# Patient Record
Sex: Male | Born: 1940 | Race: Black or African American | Hispanic: No | Marital: Married | Smoking: Never smoker
Health system: Southern US, Community
[De-identification: ages and names within clinical notes are randomized; demographics above are authoritative.]

## PROBLEM LIST (undated history)

## (undated) DIAGNOSIS — H547 Unspecified visual loss: Secondary | ICD-10-CM

## (undated) DIAGNOSIS — H409 Unspecified glaucoma: Secondary | ICD-10-CM

## (undated) DIAGNOSIS — M1990 Unspecified osteoarthritis, unspecified site: Secondary | ICD-10-CM

## (undated) DIAGNOSIS — E7800 Pure hypercholesterolemia, unspecified: Secondary | ICD-10-CM

## (undated) DIAGNOSIS — H269 Unspecified cataract: Secondary | ICD-10-CM

## (undated) DIAGNOSIS — I10 Essential (primary) hypertension: Secondary | ICD-10-CM

## (undated) HISTORY — DX: Pure hypercholesterolemia, unspecified: E78.00

## (undated) HISTORY — PX: EYE SURGERY: SHX253

## (undated) HISTORY — PX: CATARACT EXTRACTION: SUR2

## (undated) HISTORY — DX: Essential (primary) hypertension: I10

## (undated) HISTORY — DX: Unspecified osteoarthritis, unspecified site: M19.90

## (undated) HISTORY — DX: Unspecified cataract: H26.9

## (undated) HISTORY — PX: JOINT REPLACEMENT: SHX530

## (undated) HISTORY — DX: Unspecified glaucoma: H40.9

## (undated) HISTORY — DX: Unspecified visual loss: H54.7

---

## 2002-05-20 HISTORY — PX: REPLACEMENT TOTAL KNEE: SUR1224

## 2006-07-25 ENCOUNTER — Encounter: Admission: RE | Admit: 2006-07-25 | Discharge: 2006-07-25 | Payer: Self-pay | Admitting: Orthopedic Surgery

## 2006-08-12 ENCOUNTER — Encounter: Admission: RE | Admit: 2006-08-12 | Discharge: 2006-08-12 | Payer: Self-pay | Admitting: Orthopedic Surgery

## 2006-08-26 ENCOUNTER — Encounter: Admission: RE | Admit: 2006-08-26 | Discharge: 2006-08-26 | Payer: Self-pay | Admitting: Orthopedic Surgery

## 2007-08-11 ENCOUNTER — Encounter: Admission: RE | Admit: 2007-08-11 | Discharge: 2007-08-11 | Payer: Self-pay | Admitting: Orthopedic Surgery

## 2007-10-27 ENCOUNTER — Encounter: Admission: RE | Admit: 2007-10-27 | Discharge: 2007-10-27 | Payer: Self-pay | Admitting: Internal Medicine

## 2008-08-22 ENCOUNTER — Encounter: Admission: RE | Admit: 2008-08-22 | Discharge: 2008-08-22 | Payer: Self-pay | Admitting: Orthopedic Surgery

## 2008-09-19 ENCOUNTER — Ambulatory Visit: Payer: Self-pay | Admitting: Surgery

## 2008-11-02 ENCOUNTER — Emergency Department (HOSPITAL_COMMUNITY): Admission: EM | Admit: 2008-11-02 | Discharge: 2008-11-02 | Payer: Self-pay | Admitting: Emergency Medicine

## 2008-11-02 ENCOUNTER — Emergency Department (HOSPITAL_COMMUNITY): Admission: EM | Admit: 2008-11-02 | Discharge: 2008-11-02 | Payer: Self-pay | Admitting: Family Medicine

## 2010-05-01 ENCOUNTER — Encounter
Admission: RE | Admit: 2010-05-01 | Discharge: 2010-05-01 | Payer: Self-pay | Source: Home / Self Care | Attending: Internal Medicine | Admitting: Internal Medicine

## 2010-08-27 LAB — POCT I-STAT, CHEM 8
BUN: 12 mg/dL (ref 6–23)
Calcium, Ion: 1.1 mmol/L — ABNORMAL LOW (ref 1.12–1.32)
Chloride: 104 mEq/L (ref 96–112)
Creatinine, Ser: 1 mg/dL (ref 0.4–1.5)
Glucose, Bld: 108 mg/dL — ABNORMAL HIGH (ref 70–99)
HCT: 44 % (ref 39.0–52.0)
Hemoglobin: 15 g/dL (ref 13.0–17.0)
Potassium: 4.1 mEq/L (ref 3.5–5.1)
Sodium: 137 mEq/L (ref 135–145)
TCO2: 24 mmol/L (ref 0–100)

## 2010-08-27 LAB — POCT URINALYSIS DIP (DEVICE)
Glucose, UA: NEGATIVE mg/dL
Ketones, ur: 15 mg/dL — AB
Nitrite: NEGATIVE
Protein, ur: 30 mg/dL — AB
Specific Gravity, Urine: 1.025 (ref 1.005–1.030)
Urobilinogen, UA: 0.2 mg/dL (ref 0.0–1.0)
pH: 5 (ref 5.0–8.0)

## 2010-10-02 NOTE — H&P (Signed)
NAME:  Chad Miller, Chad Miller NO.:  [id number]   MEDICAL RECORD NO.:  [id number]          PATIENT TYPE:  EMS   LOCATION:  MAJO                         FACILITY:  [facility]   PHYSICIAN:  Lennie Muckle, MD      DATE OF BIRTH:  Dec 22, 1940   DATE OF ADMISSION:  11/02/2008  DATE OF DISCHARGE:  11/02/2008                              HISTORY & PHYSICAL   This is a very pleasant 70 year old male who fell in the shower earlier  this week on Sunday and had pain in his right side and flank since then.  The pain got so severe, then he went to the Urgent Care Clinic today and  he was found to have at least 2 rib fractures and it was felt he should  be referred to the ED.  This was done as the patient was having  significant pain and difficulty with deep breathing.  He underwent  evaluation with CT scanning and this did show multiple right-sided rib  fractures, a very small hemothorax, and small liver laceration.  The  patient's hemoglobin today was 15.  His blood pressure was stable at  126/81 and his heart rate was in the 90s.  His main complaint was of  posterior and lateral rib pain.  He had not been given any pain  medication whatsoever.   PAST MEDICAL HISTORY:  Significant for hypertension,  hypercholesterolemia.  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   PHYSICAL EXAMINATION:  GENERAL:  This is a well-nourished, well-  developed, slender African American male who is complaining of pain over  his right chest wall.  VITAL SIGNS:  Blood pressure was 126/81, heart rate was 92, oxygen  saturation was 97% on room air.  HEENT:  [place]/AT, EOMI, he had a cloudy cornea on the right and no vision  except for light dark in the right eye.  His exam was otherwise within  functional limits.  Trachea was midline.  CHEST:  Clear on the left.  He has slightly diminished breath sounds in  the right base, but he was otherwise clear.   Chest wall tenderness on  the right posterior lateral and lateral aspect.  CVS:  Nontender.  ABDOMINAL:  Positive bowel sounds, soft, nontender.  EXTREMITIES:  Without deformity.   SOCIAL HISTORY:  He is married, retired, and has a supportive wife.  No  alcohol, no drugs, and no tobacco use.   IMPRESSION:  Status post rib fractures x2 on the right, grade 1-2 liver  laceration which is already now 67-26 days old, doing well, and  hemodynamically stable.   PLAN:  We are going to give the patient some oral pain medication and  Flexeril for muscle spasms and see if this controls the patient's pain  enough that he might be able to mobilize and return home with a  prescription.  He will likely be given Percocet 5/325 #80 no refill and  Flexeril 10 mg p.o. t.i.d. p.r.n. muscle spasms.  I suspect that he will  do well and  should be able to be discharged to home later on this  afternoon.  We will discharge him with rib fracture, pain management,  and liver and spleen injury instruction sheets per trauma services.  We  will also plan on having the patient follow up next week in Trauma  Clinic on November 10, 2008.      Shawn Rayburn, P.A.      Lennie Muckle, MD  Electronically Signed    SR/MEDQ  D:  11/02/2008  T:  11/03/2008  Job:  [id number]   cc:   [facility]

## 2010-10-02 NOTE — Assessment & Plan Note (Signed)
OFFICE VISIT   Chad Miller, Chad Miller  DOB:  08-13-1940                                       09/19/2008  ZOXWR#:[id number]   REASON FOR VISIT:  Decreased ABIs.   HISTORY:  This is a 70 year old gentleman that I am seeing at the  request of Dorene Grebe for evaluation of decreased ABIs.  The patient has  a history of undergoing a left knee replacement in 2004 in [place], he  is being followed for left leg pain and numbness.  He underwent  noninvasive vascular lab studies which revealed an ankle brachial index  of 0.57 on the left and 1.13 on the right.  On specific examination, the  patient states that he does not endorse symptoms of claudication or rest  pain.  In fact, he can walk on a treadmill for greater than 30 minutes.  He is limited somewhat by pain and stiffness the following day within  his knee.  He also complains of some back pain which he feels a numbness  and burning that goes down his leg.  This has been going on since his  operation many years ago.  It has progressed somewhat.   The patient does have a history of hypercholesterolemia and  hypertension, both of which he is on medications for and has been doing  very well.  He does not have a significant cardiac history.   REVIEW OF SYSTEMS:  General:  Negative for fevers, chills, weight gain,  weight loss.  Cardiac:  Negative for chest pain, shortness of breath.  Pulmonary:  Negative for bronchitis, asthma, wheezing.  GI:  Negative.  GU:  Negative.  Vascular:  Positive for pain in his feet when lying  flat.  Neuro:  Negative.  Ortho:  Positive for arthritis and joint pain.  Psych:  Negative.  ENT:  Negative.  Heme:  Negative.   PAST MEDICAL HISTORY:  1. Hypertension.  2. Hypercholesterolemia.   PAST SURGICAL HISTORY:  Left total knee replacement in 2004.   FAMILY HISTORY:  Negative for cardiovascular disease at an early age.   SOCIAL HISTORY:  He is married with 2 children.  He is retired.   Does  not smoke, has never smoked.  Does not drink.   MEDICATIONS:  Benicar once daily, Lipitor once daily, and aspirin once  daily.   ALLERGIES TO MEDICATIONS:  None.   PHYSICAL EXAMINATION:  Vital signs:  Blood pressure is 145/83, pulse is  72.  General:  He is well-appearing, in no distress.  HEENT:  Normocephalic, atraumatic.  Pupils equal.  Sclerae anicteric.  Neck:  Supple.  No JVD.  No carotid bruits.  Cardiovascular:  Regular rate and  rhythm.  No murmurs, rubs or gallops.  Pulmonary:  Lungs are clear to  auscultation bilaterally.  Abdomen:  Soft, nontender.  No pulsatile  mass, no hepatosplenomegaly.  Extremities:  Warm and well-perfused.  Pedal pulses are not palpable on the left, there are no ulcerations.  Neuro:  Cranial nerves II-XII are grossly intact, he is nonfocal.  Skin:  Without rash.   DIAGNOSTIC STUDIES:  The patient comes with ultrasound which reveals  ankle brachial index of 0.57 on the left and 1.1 on the right.  He has a  triphasic left popliteal signal, biphasic posterior tibial and  monophasic dorsalis pedis.   ASSESSMENT/PLAN:  Asymptomatic peripheral vascular disease.  Plan:  I discussed with the patient the need for continued medical  management including cholesterol regulation and control of his blood  pressure.  He also will continue to take his aspirin.  At this point,  the patient does not need any intervention regarding his peripheral  vascular disease.  This will need to be followed over time and if he  ever becomes symptomatic we will pursue our options at that time.  I  will not schedule him to come back to see me; however, if he has any  change in his symptoms or evidence of claudication he will contact me.   Jorge Ny, MD  Electronically Signed   VWB/MEDQ  D:  09/19/2008  T:  09/20/2008  Job:  [id number]   cc:   G. Dorene Grebe, M.D.  Candyce Churn. Allyne Gee, M.D.

## 2010-11-06 ENCOUNTER — Other Ambulatory Visit: Payer: Self-pay | Admitting: Internal Medicine

## 2010-11-06 DIAGNOSIS — M5410 Radiculopathy, site unspecified: Secondary | ICD-10-CM

## 2010-11-06 DIAGNOSIS — M545 Low back pain, unspecified: Secondary | ICD-10-CM

## 2010-11-26 ENCOUNTER — Ambulatory Visit
Admission: RE | Admit: 2010-11-26 | Discharge: 2010-11-26 | Disposition: A | Discharge status: Home / Self Care | Payer: Medicare Other | Source: Ambulatory Visit | Attending: Internal Medicine | Admitting: Internal Medicine

## 2010-11-26 DIAGNOSIS — M5410 Radiculopathy, site unspecified: Secondary | ICD-10-CM

## 2010-11-26 DIAGNOSIS — M545 Low back pain, unspecified: Secondary | ICD-10-CM

## 2011-05-27 DIAGNOSIS — R972 Elevated prostate specific antigen [PSA]: Secondary | ICD-10-CM | POA: Diagnosis not present

## 2011-05-29 DIAGNOSIS — K219 Gastro-esophageal reflux disease without esophagitis: Secondary | ICD-10-CM | POA: Diagnosis not present

## 2011-06-06 DIAGNOSIS — N401 Enlarged prostate with lower urinary tract symptoms: Secondary | ICD-10-CM | POA: Diagnosis not present

## 2011-06-12 DIAGNOSIS — Z79899 Other long term (current) drug therapy: Secondary | ICD-10-CM | POA: Diagnosis not present

## 2011-06-12 DIAGNOSIS — I10 Essential (primary) hypertension: Secondary | ICD-10-CM | POA: Diagnosis not present

## 2011-06-12 DIAGNOSIS — Z23 Encounter for immunization: Secondary | ICD-10-CM | POA: Diagnosis not present

## 2011-06-12 DIAGNOSIS — Z0000 Encounter for general adult medical examination without abnormal findings: Secondary | ICD-10-CM | POA: Diagnosis not present

## 2011-06-12 DIAGNOSIS — E7800 Pure hypercholesterolemia, unspecified: Secondary | ICD-10-CM | POA: Diagnosis not present

## 2011-06-12 DIAGNOSIS — M899 Disorder of bone, unspecified: Secondary | ICD-10-CM | POA: Diagnosis not present

## 2011-09-18 DIAGNOSIS — M67919 Unspecified disorder of synovium and tendon, unspecified shoulder: Secondary | ICD-10-CM | POA: Diagnosis not present

## 2011-09-18 DIAGNOSIS — M542 Cervicalgia: Secondary | ICD-10-CM | POA: Diagnosis not present

## 2011-10-03 DIAGNOSIS — M2550 Pain in unspecified joint: Secondary | ICD-10-CM | POA: Diagnosis not present

## 2011-10-03 DIAGNOSIS — I119 Hypertensive heart disease without heart failure: Secondary | ICD-10-CM | POA: Diagnosis not present

## 2011-10-03 DIAGNOSIS — R42 Dizziness and giddiness: Secondary | ICD-10-CM | POA: Diagnosis not present

## 2011-10-03 DIAGNOSIS — J309 Allergic rhinitis, unspecified: Secondary | ICD-10-CM | POA: Diagnosis not present

## 2011-10-03 DIAGNOSIS — Z79899 Other long term (current) drug therapy: Secondary | ICD-10-CM | POA: Diagnosis not present

## 2011-10-03 DIAGNOSIS — I10 Essential (primary) hypertension: Secondary | ICD-10-CM | POA: Diagnosis not present

## 2011-10-03 DIAGNOSIS — E7800 Pure hypercholesterolemia, unspecified: Secondary | ICD-10-CM | POA: Diagnosis not present

## 2011-10-08 DIAGNOSIS — H2010 Chronic iridocyclitis, unspecified eye: Secondary | ICD-10-CM | POA: Diagnosis not present

## 2011-10-08 DIAGNOSIS — H2510 Age-related nuclear cataract, unspecified eye: Secondary | ICD-10-CM | POA: Diagnosis not present

## 2011-10-16 DIAGNOSIS — M67919 Unspecified disorder of synovium and tendon, unspecified shoulder: Secondary | ICD-10-CM | POA: Diagnosis not present

## 2011-10-16 DIAGNOSIS — M719 Bursopathy, unspecified: Secondary | ICD-10-CM | POA: Diagnosis not present

## 2011-10-28 DIAGNOSIS — I10 Essential (primary) hypertension: Secondary | ICD-10-CM | POA: Diagnosis not present

## 2011-10-28 DIAGNOSIS — K219 Gastro-esophageal reflux disease without esophagitis: Secondary | ICD-10-CM | POA: Diagnosis not present

## 2011-10-28 DIAGNOSIS — Z79899 Other long term (current) drug therapy: Secondary | ICD-10-CM | POA: Diagnosis not present

## 2011-10-30 DIAGNOSIS — I10 Essential (primary) hypertension: Secondary | ICD-10-CM | POA: Diagnosis not present

## 2011-10-30 DIAGNOSIS — E7800 Pure hypercholesterolemia, unspecified: Secondary | ICD-10-CM | POA: Diagnosis not present

## 2011-10-30 DIAGNOSIS — R42 Dizziness and giddiness: Secondary | ICD-10-CM | POA: Diagnosis not present

## 2011-10-30 DIAGNOSIS — I4510 Unspecified right bundle-branch block: Secondary | ICD-10-CM | POA: Diagnosis not present

## 2011-11-28 DIAGNOSIS — I10 Essential (primary) hypertension: Secondary | ICD-10-CM | POA: Diagnosis not present

## 2011-11-28 DIAGNOSIS — I4510 Unspecified right bundle-branch block: Secondary | ICD-10-CM | POA: Diagnosis not present

## 2011-11-28 DIAGNOSIS — R42 Dizziness and giddiness: Secondary | ICD-10-CM | POA: Diagnosis not present

## 2011-12-23 DIAGNOSIS — Z79899 Other long term (current) drug therapy: Secondary | ICD-10-CM | POA: Diagnosis not present

## 2011-12-23 DIAGNOSIS — R42 Dizziness and giddiness: Secondary | ICD-10-CM | POA: Diagnosis not present

## 2011-12-23 DIAGNOSIS — E559 Vitamin D deficiency, unspecified: Secondary | ICD-10-CM | POA: Diagnosis not present

## 2011-12-23 DIAGNOSIS — I10 Essential (primary) hypertension: Secondary | ICD-10-CM | POA: Diagnosis not present

## 2011-12-31 DIAGNOSIS — R42 Dizziness and giddiness: Secondary | ICD-10-CM | POA: Diagnosis not present

## 2011-12-31 DIAGNOSIS — H814 Vertigo of central origin: Secondary | ICD-10-CM | POA: Diagnosis not present

## 2011-12-31 DIAGNOSIS — H903 Sensorineural hearing loss, bilateral: Secondary | ICD-10-CM | POA: Diagnosis not present

## 2012-01-14 ENCOUNTER — Other Ambulatory Visit: Payer: Self-pay | Admitting: Gastroenterology

## 2012-01-14 DIAGNOSIS — R634 Abnormal weight loss: Secondary | ICD-10-CM | POA: Diagnosis not present

## 2012-01-14 DIAGNOSIS — R109 Unspecified abdominal pain: Secondary | ICD-10-CM

## 2012-01-14 DIAGNOSIS — R1033 Periumbilical pain: Secondary | ICD-10-CM | POA: Diagnosis not present

## 2012-01-14 DIAGNOSIS — K219 Gastro-esophageal reflux disease without esophagitis: Secondary | ICD-10-CM | POA: Diagnosis not present

## 2012-01-14 DIAGNOSIS — R110 Nausea: Secondary | ICD-10-CM | POA: Diagnosis not present

## 2012-01-17 ENCOUNTER — Ambulatory Visit
Admission: RE | Admit: 2012-01-17 | Discharge: 2012-01-17 | Disposition: A | Discharge status: Home / Self Care | Payer: Medicare Other | Source: Ambulatory Visit | Attending: Gastroenterology | Admitting: Gastroenterology

## 2012-01-17 DIAGNOSIS — K5730 Diverticulosis of large intestine without perforation or abscess without bleeding: Secondary | ICD-10-CM | POA: Diagnosis not present

## 2012-01-17 DIAGNOSIS — K449 Diaphragmatic hernia without obstruction or gangrene: Secondary | ICD-10-CM | POA: Diagnosis not present

## 2012-01-17 DIAGNOSIS — R109 Unspecified abdominal pain: Secondary | ICD-10-CM

## 2012-01-17 DIAGNOSIS — K7689 Other specified diseases of liver: Secondary | ICD-10-CM | POA: Diagnosis not present

## 2012-01-17 MED ORDER — IOHEXOL 300 MG/ML  SOLN
100.0000 mL | Freq: Once | INTRAMUSCULAR | Status: AC | PRN
  Administered 2012-01-17: 100 mL via INTRAVENOUS

## 2012-01-24 DIAGNOSIS — Z23 Encounter for immunization: Secondary | ICD-10-CM | POA: Diagnosis not present

## 2012-04-09 DIAGNOSIS — K219 Gastro-esophageal reflux disease without esophagitis: Secondary | ICD-10-CM | POA: Diagnosis not present

## 2012-04-09 DIAGNOSIS — K5730 Diverticulosis of large intestine without perforation or abscess without bleeding: Secondary | ICD-10-CM | POA: Diagnosis not present

## 2012-05-21 DIAGNOSIS — I10 Essential (primary) hypertension: Secondary | ICD-10-CM | POA: Diagnosis not present

## 2012-05-21 DIAGNOSIS — E7800 Pure hypercholesterolemia, unspecified: Secondary | ICD-10-CM | POA: Diagnosis not present

## 2012-05-21 DIAGNOSIS — R42 Dizziness and giddiness: Secondary | ICD-10-CM | POA: Diagnosis not present

## 2012-05-22 DIAGNOSIS — N401 Enlarged prostate with lower urinary tract symptoms: Secondary | ICD-10-CM | POA: Diagnosis not present

## 2012-05-27 DIAGNOSIS — N401 Enlarged prostate with lower urinary tract symptoms: Secondary | ICD-10-CM | POA: Diagnosis not present

## 2012-07-07 DIAGNOSIS — E7800 Pure hypercholesterolemia, unspecified: Secondary | ICD-10-CM | POA: Diagnosis not present

## 2012-07-07 DIAGNOSIS — Z79899 Other long term (current) drug therapy: Secondary | ICD-10-CM | POA: Diagnosis not present

## 2012-07-07 DIAGNOSIS — I119 Hypertensive heart disease without heart failure: Secondary | ICD-10-CM | POA: Diagnosis not present

## 2012-07-07 DIAGNOSIS — E559 Vitamin D deficiency, unspecified: Secondary | ICD-10-CM | POA: Diagnosis not present

## 2012-07-07 DIAGNOSIS — J3489 Other specified disorders of nose and nasal sinuses: Secondary | ICD-10-CM | POA: Diagnosis not present

## 2012-07-07 DIAGNOSIS — Z0000 Encounter for general adult medical examination without abnormal findings: Secondary | ICD-10-CM | POA: Diagnosis not present

## 2012-09-11 DIAGNOSIS — M25569 Pain in unspecified knee: Secondary | ICD-10-CM | POA: Diagnosis not present

## 2012-09-22 DIAGNOSIS — H179 Unspecified corneal scar and opacity: Secondary | ICD-10-CM | POA: Diagnosis not present

## 2012-09-22 DIAGNOSIS — H25019 Cortical age-related cataract, unspecified eye: Secondary | ICD-10-CM | POA: Diagnosis not present

## 2012-09-22 DIAGNOSIS — H40019 Open angle with borderline findings, low risk, unspecified eye: Secondary | ICD-10-CM | POA: Diagnosis not present

## 2012-10-09 DIAGNOSIS — M25569 Pain in unspecified knee: Secondary | ICD-10-CM | POA: Diagnosis not present

## 2012-10-09 DIAGNOSIS — M67919 Unspecified disorder of synovium and tendon, unspecified shoulder: Secondary | ICD-10-CM | POA: Diagnosis not present

## 2012-10-09 DIAGNOSIS — M719 Bursopathy, unspecified: Secondary | ICD-10-CM | POA: Diagnosis not present

## 2013-01-04 DIAGNOSIS — R634 Abnormal weight loss: Secondary | ICD-10-CM | POA: Diagnosis not present

## 2013-01-04 DIAGNOSIS — I119 Hypertensive heart disease without heart failure: Secondary | ICD-10-CM | POA: Diagnosis not present

## 2013-01-04 DIAGNOSIS — K219 Gastro-esophageal reflux disease without esophagitis: Secondary | ICD-10-CM | POA: Diagnosis not present

## 2013-01-04 DIAGNOSIS — Z79899 Other long term (current) drug therapy: Secondary | ICD-10-CM | POA: Diagnosis not present

## 2013-01-04 DIAGNOSIS — E7800 Pure hypercholesterolemia, unspecified: Secondary | ICD-10-CM | POA: Diagnosis not present

## 2013-01-12 DIAGNOSIS — R634 Abnormal weight loss: Secondary | ICD-10-CM | POA: Diagnosis not present

## 2013-01-12 DIAGNOSIS — R1319 Other dysphagia: Secondary | ICD-10-CM | POA: Diagnosis not present

## 2013-01-12 DIAGNOSIS — K219 Gastro-esophageal reflux disease without esophagitis: Secondary | ICD-10-CM | POA: Diagnosis not present

## 2013-02-08 DIAGNOSIS — H40019 Open angle with borderline findings, low risk, unspecified eye: Secondary | ICD-10-CM | POA: Diagnosis not present

## 2013-02-09 DIAGNOSIS — Z23 Encounter for immunization: Secondary | ICD-10-CM | POA: Diagnosis not present

## 2013-02-24 DIAGNOSIS — K2970 Gastritis, unspecified, without bleeding: Secondary | ICD-10-CM | POA: Diagnosis not present

## 2013-02-24 DIAGNOSIS — R1319 Other dysphagia: Secondary | ICD-10-CM | POA: Diagnosis not present

## 2013-02-24 DIAGNOSIS — K219 Gastro-esophageal reflux disease without esophagitis: Secondary | ICD-10-CM | POA: Diagnosis not present

## 2013-02-24 DIAGNOSIS — R634 Abnormal weight loss: Secondary | ICD-10-CM | POA: Diagnosis not present

## 2013-02-24 DIAGNOSIS — R110 Nausea: Secondary | ICD-10-CM | POA: Diagnosis not present

## 2013-02-24 DIAGNOSIS — K2940 Chronic atrophic gastritis without bleeding: Secondary | ICD-10-CM | POA: Diagnosis not present

## 2013-02-24 DIAGNOSIS — R1310 Dysphagia, unspecified: Secondary | ICD-10-CM | POA: Diagnosis not present

## 2013-02-24 DIAGNOSIS — A048 Other specified bacterial intestinal infections: Secondary | ICD-10-CM | POA: Diagnosis not present

## 2013-03-30 DIAGNOSIS — K449 Diaphragmatic hernia without obstruction or gangrene: Secondary | ICD-10-CM | POA: Diagnosis not present

## 2013-03-30 DIAGNOSIS — K2900 Acute gastritis without bleeding: Secondary | ICD-10-CM | POA: Diagnosis not present

## 2013-03-30 DIAGNOSIS — A048 Other specified bacterial intestinal infections: Secondary | ICD-10-CM | POA: Diagnosis not present

## 2013-04-22 DIAGNOSIS — A048 Other specified bacterial intestinal infections: Secondary | ICD-10-CM | POA: Diagnosis not present

## 2013-04-23 DIAGNOSIS — M25569 Pain in unspecified knee: Secondary | ICD-10-CM | POA: Diagnosis not present

## 2013-05-31 DIAGNOSIS — R972 Elevated prostate specific antigen [PSA]: Secondary | ICD-10-CM | POA: Diagnosis not present

## 2013-05-31 DIAGNOSIS — N400 Enlarged prostate without lower urinary tract symptoms: Secondary | ICD-10-CM | POA: Diagnosis not present

## 2013-08-04 DIAGNOSIS — I4510 Unspecified right bundle-branch block: Secondary | ICD-10-CM | POA: Diagnosis not present

## 2013-08-04 DIAGNOSIS — E7800 Pure hypercholesterolemia, unspecified: Secondary | ICD-10-CM | POA: Diagnosis not present

## 2013-08-04 DIAGNOSIS — Z0000 Encounter for general adult medical examination without abnormal findings: Secondary | ICD-10-CM | POA: Diagnosis not present

## 2013-08-04 DIAGNOSIS — I10 Essential (primary) hypertension: Secondary | ICD-10-CM | POA: Diagnosis not present

## 2013-08-04 DIAGNOSIS — R209 Unspecified disturbances of skin sensation: Secondary | ICD-10-CM | POA: Diagnosis not present

## 2013-08-04 DIAGNOSIS — E559 Vitamin D deficiency, unspecified: Secondary | ICD-10-CM | POA: Diagnosis not present

## 2013-08-04 DIAGNOSIS — R7309 Other abnormal glucose: Secondary | ICD-10-CM | POA: Diagnosis not present

## 2013-08-09 DIAGNOSIS — H4011X0 Primary open-angle glaucoma, stage unspecified: Secondary | ICD-10-CM | POA: Diagnosis not present

## 2013-08-09 DIAGNOSIS — H409 Unspecified glaucoma: Secondary | ICD-10-CM | POA: Diagnosis not present

## 2013-08-17 DIAGNOSIS — I700 Atherosclerosis of aorta: Secondary | ICD-10-CM | POA: Diagnosis not present

## 2013-08-17 DIAGNOSIS — I10 Essential (primary) hypertension: Secondary | ICD-10-CM | POA: Diagnosis not present

## 2013-09-09 DIAGNOSIS — D72819 Decreased white blood cell count, unspecified: Secondary | ICD-10-CM | POA: Diagnosis not present

## 2013-10-14 DIAGNOSIS — K219 Gastro-esophageal reflux disease without esophagitis: Secondary | ICD-10-CM | POA: Diagnosis not present

## 2013-10-14 DIAGNOSIS — R1319 Other dysphagia: Secondary | ICD-10-CM | POA: Diagnosis not present

## 2013-10-14 DIAGNOSIS — K5730 Diverticulosis of large intestine without perforation or abscess without bleeding: Secondary | ICD-10-CM | POA: Diagnosis not present

## 2013-11-09 DIAGNOSIS — N401 Enlarged prostate with lower urinary tract symptoms: Secondary | ICD-10-CM | POA: Diagnosis not present

## 2013-11-09 DIAGNOSIS — N139 Obstructive and reflux uropathy, unspecified: Secondary | ICD-10-CM | POA: Diagnosis not present

## 2013-12-13 DIAGNOSIS — R5381 Other malaise: Secondary | ICD-10-CM | POA: Diagnosis not present

## 2013-12-13 DIAGNOSIS — R5383 Other fatigue: Secondary | ICD-10-CM | POA: Diagnosis not present

## 2013-12-13 DIAGNOSIS — R7309 Other abnormal glucose: Secondary | ICD-10-CM | POA: Diagnosis not present

## 2013-12-13 DIAGNOSIS — N400 Enlarged prostate without lower urinary tract symptoms: Secondary | ICD-10-CM | POA: Diagnosis not present

## 2014-01-28 DIAGNOSIS — Z23 Encounter for immunization: Secondary | ICD-10-CM | POA: Diagnosis not present

## 2014-02-07 DIAGNOSIS — I10 Essential (primary) hypertension: Secondary | ICD-10-CM | POA: Diagnosis not present

## 2014-02-07 DIAGNOSIS — E7800 Pure hypercholesterolemia, unspecified: Secondary | ICD-10-CM | POA: Diagnosis not present

## 2014-02-07 DIAGNOSIS — Z79899 Other long term (current) drug therapy: Secondary | ICD-10-CM | POA: Diagnosis not present

## 2014-03-16 DIAGNOSIS — H25012 Cortical age-related cataract, left eye: Secondary | ICD-10-CM | POA: Diagnosis not present

## 2014-03-16 DIAGNOSIS — H4011X3 Primary open-angle glaucoma, severe stage: Secondary | ICD-10-CM | POA: Diagnosis not present

## 2014-05-23 DIAGNOSIS — N400 Enlarged prostate without lower urinary tract symptoms: Secondary | ICD-10-CM | POA: Diagnosis not present

## 2014-05-26 DIAGNOSIS — M25562 Pain in left knee: Secondary | ICD-10-CM | POA: Diagnosis not present

## 2014-05-26 DIAGNOSIS — Z96652 Presence of left artificial knee joint: Secondary | ICD-10-CM | POA: Diagnosis not present

## 2014-05-30 DIAGNOSIS — R3912 Poor urinary stream: Secondary | ICD-10-CM | POA: Diagnosis not present

## 2014-05-30 DIAGNOSIS — N401 Enlarged prostate with lower urinary tract symptoms: Secondary | ICD-10-CM | POA: Diagnosis not present

## 2014-05-30 DIAGNOSIS — R351 Nocturia: Secondary | ICD-10-CM | POA: Diagnosis not present

## 2014-05-30 DIAGNOSIS — N529 Male erectile dysfunction, unspecified: Secondary | ICD-10-CM | POA: Diagnosis not present

## 2014-09-14 DIAGNOSIS — N182 Chronic kidney disease, stage 2 (mild): Secondary | ICD-10-CM | POA: Diagnosis not present

## 2014-09-14 DIAGNOSIS — R7309 Other abnormal glucose: Secondary | ICD-10-CM | POA: Diagnosis not present

## 2014-09-14 DIAGNOSIS — I129 Hypertensive chronic kidney disease with stage 1 through stage 4 chronic kidney disease, or unspecified chronic kidney disease: Secondary | ICD-10-CM | POA: Diagnosis not present

## 2014-09-14 DIAGNOSIS — Z0000 Encounter for general adult medical examination without abnormal findings: Secondary | ICD-10-CM | POA: Diagnosis not present

## 2014-09-14 DIAGNOSIS — R202 Paresthesia of skin: Secondary | ICD-10-CM | POA: Diagnosis not present

## 2014-09-14 DIAGNOSIS — M79642 Pain in left hand: Secondary | ICD-10-CM | POA: Diagnosis not present

## 2014-09-14 DIAGNOSIS — M85852 Other specified disorders of bone density and structure, left thigh: Secondary | ICD-10-CM | POA: Diagnosis not present

## 2014-09-14 DIAGNOSIS — E559 Vitamin D deficiency, unspecified: Secondary | ICD-10-CM | POA: Diagnosis not present

## 2014-09-14 DIAGNOSIS — R413 Other amnesia: Secondary | ICD-10-CM | POA: Diagnosis not present

## 2014-09-14 DIAGNOSIS — Z1389 Encounter for screening for other disorder: Secondary | ICD-10-CM | POA: Diagnosis not present

## 2014-10-10 DIAGNOSIS — H40012 Open angle with borderline findings, low risk, left eye: Secondary | ICD-10-CM | POA: Diagnosis not present

## 2014-11-15 DIAGNOSIS — M79606 Pain in leg, unspecified: Secondary | ICD-10-CM | POA: Diagnosis not present

## 2014-11-15 DIAGNOSIS — I10 Essential (primary) hypertension: Secondary | ICD-10-CM | POA: Diagnosis not present

## 2014-12-30 DIAGNOSIS — I129 Hypertensive chronic kidney disease with stage 1 through stage 4 chronic kidney disease, or unspecified chronic kidney disease: Secondary | ICD-10-CM | POA: Diagnosis not present

## 2014-12-30 DIAGNOSIS — D72819 Decreased white blood cell count, unspecified: Secondary | ICD-10-CM | POA: Diagnosis not present

## 2014-12-30 DIAGNOSIS — N182 Chronic kidney disease, stage 2 (mild): Secondary | ICD-10-CM | POA: Diagnosis not present

## 2014-12-30 DIAGNOSIS — E780 Pure hypercholesterolemia: Secondary | ICD-10-CM | POA: Diagnosis not present

## 2015-01-16 DIAGNOSIS — Z23 Encounter for immunization: Secondary | ICD-10-CM | POA: Diagnosis not present

## 2015-03-16 DIAGNOSIS — R7309 Other abnormal glucose: Secondary | ICD-10-CM | POA: Diagnosis not present

## 2015-03-16 DIAGNOSIS — E7800 Pure hypercholesterolemia, unspecified: Secondary | ICD-10-CM | POA: Diagnosis not present

## 2015-03-16 DIAGNOSIS — M1712 Unilateral primary osteoarthritis, left knee: Secondary | ICD-10-CM | POA: Diagnosis not present

## 2015-03-16 DIAGNOSIS — I129 Hypertensive chronic kidney disease with stage 1 through stage 4 chronic kidney disease, or unspecified chronic kidney disease: Secondary | ICD-10-CM | POA: Diagnosis not present

## 2015-03-16 DIAGNOSIS — N182 Chronic kidney disease, stage 2 (mild): Secondary | ICD-10-CM | POA: Diagnosis not present

## 2015-04-03 DIAGNOSIS — H25012 Cortical age-related cataract, left eye: Secondary | ICD-10-CM | POA: Diagnosis not present

## 2015-04-03 DIAGNOSIS — H40012 Open angle with borderline findings, low risk, left eye: Secondary | ICD-10-CM | POA: Diagnosis not present

## 2015-04-03 DIAGNOSIS — H401113 Primary open-angle glaucoma, right eye, severe stage: Secondary | ICD-10-CM | POA: Diagnosis not present

## 2015-06-07 DIAGNOSIS — N5201 Erectile dysfunction due to arterial insufficiency: Secondary | ICD-10-CM | POA: Diagnosis not present

## 2015-06-07 DIAGNOSIS — Z0000 Encounter for general adult medical examination without abnormal findings: Secondary | ICD-10-CM | POA: Diagnosis not present

## 2015-06-07 DIAGNOSIS — R972 Elevated prostate specific antigen [PSA]: Secondary | ICD-10-CM | POA: Diagnosis not present

## 2015-06-07 DIAGNOSIS — R350 Frequency of micturition: Secondary | ICD-10-CM | POA: Diagnosis not present

## 2015-06-07 DIAGNOSIS — N401 Enlarged prostate with lower urinary tract symptoms: Secondary | ICD-10-CM | POA: Diagnosis not present

## 2015-06-08 DIAGNOSIS — Z96652 Presence of left artificial knee joint: Secondary | ICD-10-CM | POA: Diagnosis not present

## 2015-06-08 DIAGNOSIS — M25562 Pain in left knee: Secondary | ICD-10-CM | POA: Diagnosis not present

## 2015-07-10 DIAGNOSIS — I129 Hypertensive chronic kidney disease with stage 1 through stage 4 chronic kidney disease, or unspecified chronic kidney disease: Secondary | ICD-10-CM | POA: Diagnosis not present

## 2015-07-10 DIAGNOSIS — N182 Chronic kidney disease, stage 2 (mild): Secondary | ICD-10-CM | POA: Diagnosis not present

## 2015-07-10 DIAGNOSIS — J309 Allergic rhinitis, unspecified: Secondary | ICD-10-CM | POA: Diagnosis not present

## 2015-08-21 DIAGNOSIS — I129 Hypertensive chronic kidney disease with stage 1 through stage 4 chronic kidney disease, or unspecified chronic kidney disease: Secondary | ICD-10-CM | POA: Diagnosis not present

## 2015-08-21 DIAGNOSIS — N182 Chronic kidney disease, stage 2 (mild): Secondary | ICD-10-CM | POA: Diagnosis not present

## 2015-08-21 DIAGNOSIS — M25562 Pain in left knee: Secondary | ICD-10-CM | POA: Diagnosis not present

## 2015-08-21 DIAGNOSIS — D72819 Decreased white blood cell count, unspecified: Secondary | ICD-10-CM | POA: Diagnosis not present

## 2015-10-03 DIAGNOSIS — H401113 Primary open-angle glaucoma, right eye, severe stage: Secondary | ICD-10-CM | POA: Diagnosis not present

## 2015-10-03 DIAGNOSIS — H25012 Cortical age-related cataract, left eye: Secondary | ICD-10-CM | POA: Diagnosis not present

## 2015-10-03 DIAGNOSIS — H40012 Open angle with borderline findings, low risk, left eye: Secondary | ICD-10-CM | POA: Diagnosis not present

## 2015-11-08 DIAGNOSIS — R7309 Other abnormal glucose: Secondary | ICD-10-CM | POA: Diagnosis not present

## 2015-11-08 DIAGNOSIS — N182 Chronic kidney disease, stage 2 (mild): Secondary | ICD-10-CM | POA: Diagnosis not present

## 2015-11-08 DIAGNOSIS — E559 Vitamin D deficiency, unspecified: Secondary | ICD-10-CM | POA: Diagnosis not present

## 2015-11-08 DIAGNOSIS — I129 Hypertensive chronic kidney disease with stage 1 through stage 4 chronic kidney disease, or unspecified chronic kidney disease: Secondary | ICD-10-CM | POA: Diagnosis not present

## 2015-11-08 DIAGNOSIS — Z0000 Encounter for general adult medical examination without abnormal findings: Secondary | ICD-10-CM | POA: Diagnosis not present

## 2015-11-29 DIAGNOSIS — R972 Elevated prostate specific antigen [PSA]: Secondary | ICD-10-CM | POA: Diagnosis not present

## 2015-12-06 DIAGNOSIS — N5201 Erectile dysfunction due to arterial insufficiency: Secondary | ICD-10-CM | POA: Diagnosis not present

## 2015-12-06 DIAGNOSIS — R972 Elevated prostate specific antigen [PSA]: Secondary | ICD-10-CM | POA: Diagnosis not present

## 2015-12-06 DIAGNOSIS — R351 Nocturia: Secondary | ICD-10-CM | POA: Diagnosis not present

## 2015-12-06 DIAGNOSIS — N401 Enlarged prostate with lower urinary tract symptoms: Secondary | ICD-10-CM | POA: Diagnosis not present

## 2016-01-15 DIAGNOSIS — Z23 Encounter for immunization: Secondary | ICD-10-CM | POA: Diagnosis not present

## 2016-03-18 DIAGNOSIS — H9190 Unspecified hearing loss, unspecified ear: Secondary | ICD-10-CM | POA: Diagnosis not present

## 2016-03-18 DIAGNOSIS — R42 Dizziness and giddiness: Secondary | ICD-10-CM | POA: Diagnosis not present

## 2016-03-18 DIAGNOSIS — Z23 Encounter for immunization: Secondary | ICD-10-CM | POA: Diagnosis not present

## 2016-03-18 DIAGNOSIS — R0982 Postnasal drip: Secondary | ICD-10-CM | POA: Diagnosis not present

## 2016-03-18 DIAGNOSIS — M4807 Spinal stenosis, lumbosacral region: Secondary | ICD-10-CM | POA: Diagnosis not present

## 2016-04-09 DIAGNOSIS — H40012 Open angle with borderline findings, low risk, left eye: Secondary | ICD-10-CM | POA: Diagnosis not present

## 2016-04-09 DIAGNOSIS — H2512 Age-related nuclear cataract, left eye: Secondary | ICD-10-CM | POA: Diagnosis not present

## 2016-04-22 ENCOUNTER — Encounter: Payer: Self-pay | Admitting: Neurology

## 2016-04-22 ENCOUNTER — Ambulatory Visit (INDEPENDENT_AMBULATORY_CARE_PROVIDER_SITE_OTHER): Payer: Medicare Other | Admitting: Neurology

## 2016-04-22 VITALS — BP 151/86 | HR 62 | Ht 70.0 in | Wt 167.5 lb

## 2016-04-22 DIAGNOSIS — H5440 Blindness, one eye, unspecified eye: Secondary | ICD-10-CM

## 2016-04-22 DIAGNOSIS — R42 Dizziness and giddiness: Secondary | ICD-10-CM | POA: Diagnosis not present

## 2016-04-22 DIAGNOSIS — M48061 Spinal stenosis, lumbar region without neurogenic claudication: Secondary | ICD-10-CM | POA: Diagnosis not present

## 2016-04-22 DIAGNOSIS — R269 Unspecified abnormalities of gait and mobility: Secondary | ICD-10-CM | POA: Diagnosis not present

## 2016-04-22 DIAGNOSIS — R3915 Urgency of urination: Secondary | ICD-10-CM

## 2016-04-22 MED ORDER — IMIPRAMINE HCL 10 MG PO TABS: 10.0000 mg | ORAL_TABLET | Freq: Every day | ORAL | 5 refills | Status: DC
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
## 2016-05-03 ENCOUNTER — Other Ambulatory Visit: Payer: Medicare Other

## 2016-05-09 ENCOUNTER — Ambulatory Visit
Admission: RE | Admit: 2016-05-09 | Discharge: 2016-05-09 | Disposition: A | Discharge status: Home / Self Care | Payer: Medicare Other | Source: Ambulatory Visit | Attending: Neurology | Admitting: Neurology

## 2016-05-09 DIAGNOSIS — R269 Unspecified abnormalities of gait and mobility: Secondary | ICD-10-CM

## 2016-05-09 DIAGNOSIS — R42 Dizziness and giddiness: Secondary | ICD-10-CM

## 2016-05-09 DIAGNOSIS — R3915 Urgency of urination: Secondary | ICD-10-CM

## 2016-05-09 DIAGNOSIS — M4802 Spinal stenosis, cervical region: Secondary | ICD-10-CM | POA: Diagnosis not present

## 2016-05-15 ENCOUNTER — Telehealth: Payer: Self-pay | Admitting: *Deleted

## 2016-05-15 NOTE — Telephone Encounter (Signed)
Per Dr Epimenio FootSater, spoke with patient and informed him that his MRI of the brain showed age related changes. The extent is a little bit more than expected for his age but none of the changes looked new.  Advised him Dr Epimenio FootSater stated these changes can affect his gait some.  Informed patient that the MRI of his cervical spine shows a lot of arthritic changes throughout the cervical spine. However, there was no pressure on the spinal cord.  Patient inquired if there is anything he can do before his follow up. Reviewed with him Dr Bonnita HollowSater's summary note at last office visit, re: consider PT, cane/walker; advised Dr Epimenio FootSater will review MRIs with him during his follow up and determine further treatment at that time.  Patient verbalized understanding,  appreciation and  confirmed his follow up appointment.

## 2016-06-05 ENCOUNTER — Ambulatory Visit: Payer: Medicare Other | Admitting: Neurology

## 2016-06-07 ENCOUNTER — Ambulatory Visit (INDEPENDENT_AMBULATORY_CARE_PROVIDER_SITE_OTHER): Payer: Self-pay | Admitting: Orthopedic Surgery

## 2016-06-13 ENCOUNTER — Ambulatory Visit (INDEPENDENT_AMBULATORY_CARE_PROVIDER_SITE_OTHER): Payer: Medicare Other | Admitting: Orthopedic Surgery

## 2016-06-13 ENCOUNTER — Encounter (INDEPENDENT_AMBULATORY_CARE_PROVIDER_SITE_OTHER): Payer: Self-pay | Admitting: Orthopedic Surgery

## 2016-06-13 DIAGNOSIS — Z96652 Presence of left artificial knee joint: Secondary | ICD-10-CM

## 2016-06-14 NOTE — Progress Notes (Signed)
Office Visit Note   Patient: Chad Miller           Date of Birth: 20-Jun-1940           MRN: [id number] Visit Date: 06/13/2016 Requested by: Dorothyann Pengobyn Sanders, MD [address] [place], [place] [id number] PCP: Gwynneth Alimentobyn N Sanders, MD  Subjective: Chief Complaint  Patient presents with  . Left Knee - Follow-up    HPI Chad Miller is a 76 year old patient who had left total knee replacement 2004.  He gets this checked out every year.  He also reports pretty significant low back pain at times.  I reviewed his old MRI scan from about 5 years ago and he doesn't have spinal stenosis.  He states that the left knee is doing well he has stiffness occasionally he does do activities as tolerated.  He denies any fevers and chills.              Review of Systems All systems reviewed are negative as they relate to the chief complaint within the history of present illness.  Patient denies  fevers or chills.    Assessment & Plan: Visit Diagnoses:  1. Presence of left artificial knee joint     Plan: Impression is left knee replacement which is functioning well.  There is no effusion in the knee.  He should come back in a year and will repeat x-rays on that knee this to make sure there is no occult bony resorption and bony cyst formation from wear.  In regards to his back I think that's something that would potentially need another MRI scan and injections if he is symptomatic.  Discussed natural history of spinal stenosis with him which would be general progression.  I'll see him back in a year  Follow-Up Instructions: Return in about 1 year (around 06/13/2017).   Orders:  No orders of the defined types were placed in this encounter.  No orders of the defined types were placed in this encounter.     Procedures: No procedures performed   Clinical Data: No additional findings.  Objective: Vital Signs: There were no vitals taken for this visit.  Physical Exam   Constitutional: Patient  appears well-developed HEENT:  Head: Normocephalic Eyes:EOM are normal Neck: Normal range of motion Cardiovascular: Normal rate Pulmonary/chest: Effort normal Neurologic: Patient is alert Skin: Skin is warm Psychiatric: Patient has normal mood and affect    Ortho Exam examination of the left knee demonstrates excellent range of motion no effusion stable collateral ligaments palpable pedal pulses no masses are noted skin changes in the left knee region he's had good ankle dorsi and plantar flexion quite hamstring strength.  Palpable pedal pulses.  Specialty Comments:  No specialty comments available.  Imaging: No results found.   PMFS History: Patient Active Problem List   Diagnosis Date Noted  . 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 Gait disturbance 04/22/2016  . Vertigo 04/22/2016  . Urinary urgency 04/22/2016  . Spinal stenosis of lumbar region 04/22/2016  . 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 High blood pressure   . High cholesterol   . Loss of vision    Right    Family History  Problem Relation Age of Onset  . Heart disease Mother   . Lung cancer Father   . Heart disease Brother   . Heart disease Brother     Past Surgical History:  Procedure Laterality Date  . REPLACEMENT  TOTAL KNEE Left 2004   Social History   Occupational History  . Retired    Social History Main Topics  . Smoking status: Never Smoker  . Smokeless tobacco: Never Used  . Alcohol use No     Comment: Quit 2015  . Drug use: No  . Sexual activity: Not on file     Comment: Married

## 2016-06-19 DIAGNOSIS — N182 Chronic kidney disease, stage 2 (mild): Secondary | ICD-10-CM | POA: Diagnosis not present

## 2016-06-19 DIAGNOSIS — M4807 Spinal stenosis, lumbosacral region: Secondary | ICD-10-CM | POA: Diagnosis not present

## 2016-06-19 DIAGNOSIS — R7309 Other abnormal glucose: Secondary | ICD-10-CM | POA: Diagnosis not present

## 2016-06-19 DIAGNOSIS — I129 Hypertensive chronic kidney disease with stage 1 through stage 4 chronic kidney disease, or unspecified chronic kidney disease: Secondary | ICD-10-CM | POA: Diagnosis not present

## 2016-06-19 DIAGNOSIS — G4700 Insomnia, unspecified: Secondary | ICD-10-CM | POA: Diagnosis not present

## 2016-07-05 ENCOUNTER — Encounter: Payer: Self-pay | Admitting: Neurology

## 2016-07-05 ENCOUNTER — Ambulatory Visit (INDEPENDENT_AMBULATORY_CARE_PROVIDER_SITE_OTHER): Payer: Medicare Other | Admitting: Neurology

## 2016-07-05 VITALS — BP 136/76 | HR 71 | Resp 16 | Ht 70.0 in | Wt 167.0 lb

## 2016-07-05 DIAGNOSIS — R3915 Urgency of urination: Secondary | ICD-10-CM | POA: Diagnosis not present

## 2016-07-05 DIAGNOSIS — R269 Unspecified abnormalities of gait and mobility: Secondary | ICD-10-CM

## 2016-07-05 DIAGNOSIS — R0683 Snoring: Secondary | ICD-10-CM | POA: Diagnosis not present

## 2016-07-05 DIAGNOSIS — G4719 Other hypersomnia: Secondary | ICD-10-CM | POA: Diagnosis not present

## 2016-07-05 DIAGNOSIS — M48061 Spinal stenosis, lumbar region without neurogenic claudication: Secondary | ICD-10-CM

## 2016-07-05 MED ORDER — CYCLOBENZAPRINE HCL 5 MG PO TABS: 5.0000 mg | ORAL_TABLET | Freq: Every day | ORAL | 5 refills | Status: DC

## 2016-07-05 NOTE — Progress Notes (Signed)
[facility]  PATIENT: Chad Miller DOB: [birth year]  REFERRING DOCTOR OR PCP:  [name]  SOURCE: patient, notes from Dr. Allyne Gee, imaging / lab reports, MRI images on PACS  _________________________________   HISTORICAL  CHIEF COMPLAINT:  Chief Complaint  Patient presents with  . Gait Disturbance    Sts. gait and vertigo are about the same.  Here to discuss MRI results/fim  . Dizziness    HISTORY OF PRESENT ILLNESS:  Chad Miller is a 76 year old man reporting difficulty with his gait, dizziness and back pain.  Since last visit, he has had MRI of the brain and cervical spine on 05/10/2016. These images were personally reviewed at this visit. 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 The extent is a little more than expected for age and there are confluent foci in the periatrial white matter.. These potentially can affect gait. The MRI of the cervical spine shows multilevel degenerative changes.  However, there is no spinal stenosis or spinal cord pathology. There is potential for left C4 nerve root compression due to degenerative changes at C3-C4 to the left.'.   He did have several concussions while playing football (played 2 years with [employer])  He feels his gait is stable. He had no tenderness slowly progressive gait disturbance over the past 3 or 4 years.   On a treadmill, he can walk 30 minutes standing up straight and has only mild discomfort.     He has back pain but it does not radiate into his leg.    He does have left leg pain from the knee that radiates into the foot when he presses on the anterior lateral aspect of his leg right above the knee.Marland Kitchen    He has had a left total knee replacement and sees Product/process development scientist at [employer].   He has known spinal stenosis at L2-L3, L3-L4 and L4-L5 based on an MRI from 2012 of the lumbar spine. I personally reviewed the MRI of the lumbar spine dated 11/26/2010.   He has  moderate spinal stenosis at L2-L3, L3-L4 and L4-L5 and mild spinal stenosis at L5-S1. Additionally, there was multilevel foraminal narrowing worse to the right at L2-L3 and L3-L4 and worse to the left at L4-L5.   Any of those exiting nerve roots could be compressed.  He does not have neurogenic claudication.  He has urinary frequency and urgency with nocturia 2-3 times a night. This is stable.  He has insomnia, waking up 2-3 times many nights.  However, he falls asleep easily.  His back hurts more when he lays down and when he is sitting or standing.Marland Kitchen    He snores and falls asleep in the evening.     He is planning on getting a cataract removed on the left.   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    EPWORTH SLEEPINESS SCALE  On a scale of 0 - 3 what is the chance of dozing:  Sitting and Reading:   3 Watching TV:    3 Sitting inactive in a public place: 0 Passenger in car for one hour: 1 Lying down to rest in the afternoon: 1 Sitting and talking to someone: 0 Sitting quietly after lunch:  3 In a car, stopped in traffic:  0  Total (out of 24):   11/24    REVIEW OF SYSTEMS: Constitutional: No fevers, chills, sweats, or change in appetite.  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  No eye pain Ear, nose and throat: No hearing loss, ear pain, nasal congestion, sore throat Cardiovascular: No chest pain, palpitations Respiratory: No shortness of breath at rest or with exertion.   No wheezes.   He snores GastrointestinaI: No nausea, vomiting, diarrhea, abdominal pain, fecal incontinence Genitourinary: as above . Musculoskeletal: Mild back pain Integumentary: No rash, pruritus, skin lesions Neurological: as above Psychiatric: No depression at this time.  No anxiety Endocrine: No palpitations, diaphoresis, change in appetite, change in weigh or increased thirst Hematologic/Lymphatic: No anemia, purpura,  petechiae. Allergic/Immunologic: No itchy/runny eyes, nasal congestion, recent allergic reactions, rashes  ALLERGIES: No Known Allergies  HOME MEDICATIONS:  Current Outpatient Prescriptions:  .  aspirin EC 81 MG tablet, Take 81 mg by mouth daily., Disp: , Rfl:  .  atorvastatin (LIPITOR) 10 MG tablet, Take 10 mg by mouth daily., Disp: , Rfl:  .  CIALIS 20 MG tablet, , Disp: , Rfl:  .  co-enzyme Q-10 30 MG capsule, Take 30 mg by mouth daily., Disp: , Rfl:  .  Multiple Vitamin (MULTIVITAMIN) tablet, Take 1 tablet by mouth daily., Disp: , Rfl:  .  olmesartan (BENICAR) 5 MG tablet, Take 10 mg by mouth daily., Disp: , Rfl:  .  cyclobenzaprine (FLEXERIL) 5 MG tablet, Take 1 tablet (5 mg total) by mouth at bedtime., Disp: 30 tablet, Rfl: 5  PAST MEDICAL HISTORY: Past Medical History:  Diagnosis Date  . High blood pressure   . High cholesterol   . Loss of vision    Right    PAST SURGICAL HISTORY: Past Surgical History:  Procedure Laterality Date  . REPLACEMENT TOTAL KNEE Left 2004    FAMILY HISTORY: Family History  Problem Relation Age of Onset  . Heart disease Mother   . Lung cancer Father   . Heart disease Brother   . Heart disease Brother     SOCIAL HISTORY:  Social History   Social History  . Marital status: Married    Spouse name: N/A  . Number of children: 2  . Years of education: College   Occupational History  . Retired    Social History Main Topics  . Smoking status: Never Smoker  . Smokeless tobacco: Never Used  . Alcohol use No     Comment: Quit 2015  . Drug use: No  . Sexual activity: Not on file     Comment: Married   Other Topics Concern  . Not on file   Social History Narrative   Lives at home w/ his wife   Right-handed   Caffeine: 1 cup per day     PHYSICAL EXAM  Vitals:   07/05/16 1120  BP: 136/76  Pulse: 71  Resp: 16  Weight: 167 lb (75.8 kg)  Height: 5\' 10"  (1.778 m)    Body mass index is 23.96 kg/m.   General: The  patient is well-developed and well-nourished and in no acute distress  Eyes:    He has right eye corneal changes, left cataract.    Musculoskeletal:  Back is non-tender to palpation  Neurologic Exam  Mental status: The patient is alert and oriented x 3 at the time of the examination. The patient has apparent normal recent and remote memory, with an apparently normal attention span and concentration ability.   Speech is normal.  Cranial nerves: Extraocular movements are full.  Left pupil reacts to light and accomodation. . There is good facial sensation to soft touch bilaterally.Facial strength  is normal.  Trapezius and sternocleidomastoid strength is normal. No dysarthria is noted.  The tongue is midline, and the patient has symmetric elevation of the soft palate. No obvious hearing deficits are noted.  Motor:  Muscle bulk is normal.   Tone is normal. Strength is  5 / 5 in all 4 extremities.   Sensory: Sensory testing is intact to pinprick, soft touch and vibration sensation in all 4 extremities.  Coordination: Cerebellar testing reveals good finger-nose-finger and good heel to shin bilaterally.  Gait and station: Station is normal.   Gait has wide stance and mildly short stride and he can turn 180 degree turn is 3 steps.   He has mild retropulsiom (3 steps).   Romberg is negative.   Reflexes: Deep tendon reflexes are symmetric and normal bilaterally in arms, 3+ at the knees, right greater than left and 1 at the ankles.    DIAGNOSTIC DATA (LABS, IMAGING, TESTING) - I reviewed patient records, labs, notes, testing and imaging myself where available.      ASSESSMENT AND PLAN  Gait disturbance - Plan: MR LUMBAR SPINE LIMITED WO CONTRAST  Urinary urgency  Spinal stenosis of lumbar region without neurogenic claudication - Plan: MR LUMBAR SPINE LIMITED WO CONTRAST  Snoring - Plan: Split night study  Excessive daytime sleepiness - Plan: Split night study   1.   His gait disorder  is likely multifactorial due to the chronic microvascular ischemic changes in the brain, spinal stenosis in the back and left knee and leg pain. To determine if spinal stenosis present on MRI in 2012 has worsened and is now causing more issues, we need to check an MRI of the lumbar spine to determine if this is occurring and consider referral for surgery based on results. Because he is not having much pain, epidural steroid injection be unlikely to help. 2.   He snores and has excessive daytime sleepiness. He also has nocturia and wakes up several times every night. We will check a sleep study and consider CPAP if he has moderate or severe sleep apnea. 3.   He will continue to stay active and exercises as tolerated. 4.   Return in 3 months or sooner if there are new or worsening neurologic symptoms.   Noraa Pickeral A. Epimenio FootSater, MD, PhD 07/05/2016, 12:56 PM Certified in Neurology, Clinical Neurophysiology, Sleep Medicine, Pain Medicine and Neuroimaging  [facility] [address], [address] [place], [place] [address] [phone]

## 2016-07-08 ENCOUNTER — Telehealth: Payer: Self-pay | Admitting: *Deleted

## 2016-07-08 DIAGNOSIS — R269 Unspecified abnormalities of gait and mobility: Secondary | ICD-10-CM

## 2016-07-08 DIAGNOSIS — M48061 Spinal stenosis, lumbar region without neurogenic claudication: Secondary | ICD-10-CM

## 2016-07-08 NOTE — Telephone Encounter (Signed)
[facility] and explained limited mri ordered in error.  New order for MRI Lumbar spine without contrast placed in [facility]./fim

## 2016-07-08 NOTE — Telephone Encounter (Signed)
Received call from Will BonnetSara, [facility] stating they do not do limited MRI studies. She stated the order will have to be changed in order for patient to have MRI there.

## 2016-07-08 NOTE — Addendum Note (Signed)
Addended by: Candis SchatzMISENHEIMER, Gillian Kluever I on: 07/08/2016 04:45 PM   Modules accepted: Orders

## 2016-07-13 ENCOUNTER — Ambulatory Visit
Admission: RE | Admit: 2016-07-13 | Discharge: 2016-07-13 | Disposition: A | Discharge status: Home / Self Care | Payer: Medicare Other | Source: Ambulatory Visit | Attending: Neurology | Admitting: Neurology

## 2016-07-13 DIAGNOSIS — M48061 Spinal stenosis, lumbar region without neurogenic claudication: Secondary | ICD-10-CM | POA: Diagnosis not present

## 2016-07-13 DIAGNOSIS — R269 Unspecified abnormalities of gait and mobility: Secondary | ICD-10-CM

## 2016-07-16 ENCOUNTER — Telehealth: Payer: Self-pay | Admitting: Neurology

## 2016-07-16 NOTE — Telephone Encounter (Signed)
The MRI of the lumbar spine shows severe multilevel degenerative changes with most part, the changes are the same as his last MRI a few years ago and there is potential for nerve root compression at several levels.. However, at L5-S1 there is more degenerative change than on the last MRI that could put some pressure on the left S1 nerve root.  The severity of his degenerative changes could affect his gait. However, since he has not had a lot of leg pain he can't be certain of this.  I recommend that a neurosurgical opinion..  I called and left a message to 07/16/2016 at 6 PM and we'll try to call back tomorrow

## 2016-07-17 NOTE — Telephone Encounter (Signed)
I called and left another message.

## 2016-07-22 ENCOUNTER — Ambulatory Visit (INDEPENDENT_AMBULATORY_CARE_PROVIDER_SITE_OTHER): Payer: Medicare Other | Admitting: Neurology

## 2016-07-22 DIAGNOSIS — G4710 Hypersomnia, unspecified: Secondary | ICD-10-CM

## 2016-07-22 DIAGNOSIS — G4719 Other hypersomnia: Secondary | ICD-10-CM

## 2016-07-22 DIAGNOSIS — R0683 Snoring: Secondary | ICD-10-CM

## 2016-07-23 NOTE — Telephone Encounter (Signed)
a 

## 2016-07-23 NOTE — Progress Notes (Signed)
PATIENT'S NAME:  Chad Miller, Chad Miller DOB:      06-Feb-1941      MR#:    [id number]     DATE OF RECORDING: 07/22/2016 REFERRING M.D.:  [name] MD Study Performed:   Baseline Polysomnogram HISTORY:  He is a 76 year old man with snoring and excessive daytime sleepiness.   The patient endorsed the Epworth Sleepiness Scale at 11/24 points.   The patient's weight 167 pounds with a height of 70 (inches), resulting in a BMI of 24. kg/m2.    The patient's neck circumference measured 16 inches.  CURRENT MEDICATIONS: Aspirin, Atorvastatin, Cialis, Multi-Vitamin, Olmesartan and Cyclobenzaprine   PROCEDURE:  This is a multichannel digital polysomnogram utilizing the Somnostar 11.2 system.  Electrodes and sensors were applied and monitored per AASM Specifications.   EEG, EOG, Chin and Limb EMG, were sampled at 200 Hz.  ECG, Snore and Nasal Pressure, Thermal Airflow, Respiratory Effort, CPAP Flow and Pressure, Oximetry was sampled at 50 Hz. Digital video and audio were recorded.      BASELINE STUDY  Lights Out was at 21:01 and Lights On at 05:00.  Total recording time (TRT) was 479.5 minutes, with a total sleep time (TST) of  117.5 minutes.   The patient's sleep latency was 189 minutes.  REM latency was 0 minutes.  The sleep efficiency was 24.5 %.     SLEEP ARCHITECTURE: WASO (Wake after sleep onset) was 322 minutes.  There were 16.5 minutes in Stage N1, 101 minutes Stage N2, 0 minutes Stage N3 and 0 minutes in Stage REM.  The percentage of Stage N1 was 14.%, Stage N2 was 86.%, Stage N3 was 0% and Stage R (REM sleep) was 0%.   The arousals were noted as: 28 were spontaneous, 0 were associated with PLMs, 11 were associated with respiratory events.    Audio and video analysis did not show any abnormal or unusual movements, behaviors, phonations or vocalizations.  EKG showed normal sinus rhythm (NSR).  He had 4 restroom breaks.  RESPIRATORY ANALYSIS:  There were a total of 11 respiratory events:  2 obstructive  apneas, 0 central apneas and 0 mixed apneas with a total of 2 apneas and an apnea index (AI) of 1. /hour. There were 9 hypopneas with a hypopnea index of 4.6 /hour. The patient also had 0 respiratory event related arousals (RERAs).      The total APNEA/HYPOPNEA INDEX (AHI) was 5.6/hour and the total RESPIRATORY DISTURBANCE INDEX was 5.6 /hour.  0 events occurred in REM sleep and 18 events in NREM. The REM AHI was 0 /hour, versus a non-REM AHI of 5.6. The patient spent 107 minutes of total sleep time in the supine position and 11 minutes in non-supine.. The supine AHI was 5.0 versus a non-supine AHI of 11.4.  OXYGEN SATURATION:  The Wake baseline 02 saturation was 94%, with the lowest being 50%. Time spent below 89% saturation equaled 0 minutes.  PERIODIC LIMB MOVEMENTS:   The patient had a total of 0 Periodic Limb Movements.  The Periodic Limb Movement (PLM) index was 0 and the PLM Arousal index was 0/hour.   IMPRESSION:  1. 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 Poor sleep efficiency of only 24.5%.   He had a delayed sleep onset.   There was no Stage N3 or Stage REM sleep recorded 3. No PLMS   RECOMMENDATIONS:  1. His mild OSA and could be treated with CPAP or an oral appliance.   Due to poor sleep efficiency and lack  of REM sleep, he could possibly have more severe OSA than was recorded during this study. 2. Consider a sleep aid if poor sleep efficiency is typical.   I certify that I have reviewed the entire raw data recording prior to the issuance of this report in accordance with the Standards of Accreditation of the American Academy of Sleep Medicine (AASM)     Shearon Baloichard Sater,MD, PhD Diplomat, American Board of Psychiatry and Neurology  Diplomat, American Board of Sleep Medicine       Demographics and Medical History           Name: Chad Miller, Chad Miller Age: [age] BMI: 24. Interp Physician: Despina Ariasichard Sater, MD  DOB: October 22, 1940 Ht-IN: 70 CM: 178 Referred By: Dorothyann Pengobyn Sanders MD  Pt. Tag:   Wt-LB: 167 KG: 76 Tested By: Idelle LeechNicholas Willlard, RPSGT  Pt. #: [id number] Sex: Male Scored By: Charlett BlakeN. Willard, RPSGT  Bed Tag: [id number] Race: African American   Sleep Summary    Sleep Time Statistics Minutes Hours    Time in Bed 479.5    8.0    Total Sleep Time 117.5    2.0    Total Sleep Time NREM 117.5    2.0    Total Sleep Time REM 0    0.0    Sleep Onset 21    0.4    Wake After Sleep Onset 322    5.4    Wake After Sleep Period 19    0.3    Latency Persistent Sleep 189    3.2    Sleep Efficiency 24.5 Percent    Lights out 21:01     Lights on 05:00    Sleep Disruption Events Count Index    Arousals 39 19.9    Awakenings 0 0    Arousals + Awakenings 39 19.9    REM Awakenings 0 0.0     Sleep Stage Statistics Wake N1 N2 N3 REM    Percent Stage to SPT 73.3  3.8  23.0  0.0  0.0  Percent   Sleep Period Time in Stage 322 16.5 101 0 0 Minutes   Latency to Stage  24 21 0 0 Minutes   Percent Stage to TST  14. 86. 0 0 Percent   EKG Summary          EKG Statistics         Heart Rate, Wake 71 BPM  TST Epochs in HR Interval 0 < 29   Heart Rate, Steady Sleep Avg 67 BPM   1 30-59   PAC Events 0 Count   231 60-79   PVC Events 0 Count   3 80-99   Bradycardia 0 Count   0 100-119   Tachycardia 0 Count   0 120-139        0 140-159    NREM REM   0 > 160   Shortest R-R .7 0       Longest R-R 1. 0          Respiration Summary  Event Statistics Total  With Arousal  With Awakening    Count Index  Count Index  Count Index   Apneas, Total 2 1.  2 1.0   0 0.0    Hypopneas, Total 9 4.6  9 4.6   0 0.0    Apnea + Hypopnea Index 11 5.6   11 5.6   0 0.0    Apneas, Supine 2 1.1     Apneas, Non Supine 0 0  Hypopneas, Supine 7 3.9     Hypopneas, Non Supine 2 11.4     % Sleep Apnea .6 Percent     % Sleep Hypopnea 2.6 Percent    Oximetry Statistics       SpO2, Mean Wake 94 Percent     SpO2, Minimum 50 Percent     SpO2, Max 98 Percent     SpO2, Mean 96 Percent             Desaturation Index, REM 0.0  Index     Desaturation Index, NREM 1.5  Index     Desaturation Index, Total 1.5  Index             SpO2 Intervals > 89% 80-89% 70-79% 60-69% 50-59% 40-49% 30-39% < 30%  117.5 Percent Sleep Time 99.6 0 0 0 0 0 0 0  Body Position Statistics   Back Side L Side R Side Prone    Total Sleep Time   107 10.5 0 10.5 0 Minutes   Percent Time to TST   91.1  8.9  0.0  8.9  0.0  Percent   Number of Events   9 2.0 0 2 0 Count   Number of Apneas   2 0 0 0 0 Count   Number of Hypopneas   7 2 0 2 0 Count   Apnea Index   1.1  0.0  0.0  0.0  0.0  Index   Hypopnea Index   3.9  11.4  0.0  11.4  0.0  Index   Apnea + Hypopnea Index   5.0  11.4  0.0  11.4  0.0  Index  Respiration Events    Non REM, Pre Rx Statistics Non Supine  Supine    Central Mixed Obstr  Central Mixed Obstr   Apneas 0 0 0  0 0 2 Count  Apneas, Minimum SpO2 0 0 0  0 0 93 Percent     Hypopneas 0 0 2  0 0 7 Count  Hypopneas, Minimum SpO2 0 0 93  0 0 91 Percent     Apnea + Hypopneas Index 0.0 0.0 11.4  0.0 0.0 5.0 Index    REM, Pre Rx Statistics Non Supine  Supine    Central Mixed Obstr  Central Mixed Obstr   Apneas 0 0 0  0 0 0 Count  Apneas, Minimum SpO2 0 0 0  0 0 0 Percent     Hypopneas 0 0 0  0 0 0 Count  Hypopneas, Minimum SpO2 0 0 0  0 0 0 Percent     Apnea + Hypopnea Index 0.0 0.0 0.0  0.0 0.0 0.0 Index  Leg Movement Summary    PLM Non REM (Incl. Wake) REM Total    No Arousal Arousal Wake No Arousal Arousal Wake No Arousal Arousal Wake Total   Isolated 8 0 0 0 0 0 8 0 0 8    PLMS 0 0 0 0 0 0 0 0 0 0    Total 8 0 0 0 0 0 8 0 0 8   PLM Statistics PLMS Total     Count Index Count Index    PLM 0 0 8 4.1     PLM with Arousal 0 0 0 0.0     PLM, with Wake 0 0 0 0.0     PLM, Arousal + Wake 0 0.0 0 0.0     PLM, No Arousal 0 0.0  8 4.1     PLM, Non  REM 0 0.0  8 4.1     PLM, REM 0 0.0  0 0.0     Technician Comments: Patient came in for a routine nocturnal  polysomnogram with possible CPAP titration. Patient was scored using the 3% rule. Patient was seen attempting to sleep supine and both lateral positions. Patient did have a very difficult time initiating sleep and maintaining that sleep. Patient however was noted to have a light snore when he was asleep and did show signs of some restrictive breathing when he was sleeping. Patient did have quite a few restroom breaks having a total of 4.

## 2016-07-25 NOTE — Telephone Encounter (Signed)
Faith, I have tried to call Mr. Beidleman several times and left messsages. Could you please try to call him again  I tried to call again about the results of the MRI of the lumbar and also the results of the sleep study.  Lumbar spine shows severe multilevel degenerative changes. Compared to his previous MRI it is worse at L5-S1 where there could be pressure on the S1 nerve root..    Since he does not have pain, this not be playing much of a role in his walking issues.    If back or legs are bothering him, we could refer him to neurosurgery.  The sleep study showed that he did have mild sleep apnea. He slept poorly in the sleep lab so it is possible that the sleep apnea would've been worse if he had her sleep. Since she does have some daytime sleepiness, it would be reasonable to consider an oral appliance if he is interested (if he is intetrested, we can refer him to one of the dentist, first ask if he has most of his teeth)

## 2016-07-26 NOTE — Telephone Encounter (Signed)
I have spoken with [name] this morning, and per RAS, reviewed MRI and sleep study results as below.  He verbalized understanding of same, would like to review further with RAS.  Does not want NS or dental referral at this time. I have explained oral appliance to him and mailed info on same to his home address, and he will let me know if he has other questions or would like dental referral/fim

## 2016-07-26 NOTE — Telephone Encounter (Signed)
Pt returned the call  He can be reached at [phone]

## 2016-08-15 ENCOUNTER — Encounter: Payer: Self-pay | Admitting: Neurology

## 2016-08-15 ENCOUNTER — Encounter (INDEPENDENT_AMBULATORY_CARE_PROVIDER_SITE_OTHER): Payer: Self-pay

## 2016-08-15 ENCOUNTER — Ambulatory Visit (INDEPENDENT_AMBULATORY_CARE_PROVIDER_SITE_OTHER): Payer: Medicare Other | Admitting: Neurology

## 2016-08-15 VITALS — BP 138/65 | HR 72 | Resp 18 | Ht 70.0 in | Wt 165.0 lb

## 2016-08-15 DIAGNOSIS — M48061 Spinal stenosis, lumbar region without neurogenic claudication: Secondary | ICD-10-CM | POA: Diagnosis not present

## 2016-08-15 DIAGNOSIS — R930 Abnormal findings on diagnostic imaging of skull and head, not elsewhere classified: Secondary | ICD-10-CM

## 2016-08-15 DIAGNOSIS — R9082 White matter disease, unspecified: Secondary | ICD-10-CM

## 2016-08-15 DIAGNOSIS — R3915 Urgency of urination: Secondary | ICD-10-CM

## 2016-08-15 DIAGNOSIS — R269 Unspecified abnormalities of gait and mobility: Secondary | ICD-10-CM | POA: Diagnosis not present

## 2016-08-15 DIAGNOSIS — G4733 Obstructive sleep apnea (adult) (pediatric): Secondary | ICD-10-CM

## 2016-08-15 MED ORDER — CYCLOBENZAPRINE HCL 5 MG PO TABS: 5.0000 mg | ORAL_TABLET | Freq: Every day | ORAL | 11 refills | Status: DC

## 2016-08-15 NOTE — Progress Notes (Signed)
[facility]  PATIENT: Chad Miller DOB: [birth year]  REFERRING DOCTOR OR PCP:  [name]  SOURCE: patient, notes from Dr. Allyne Gee, imaging / lab reports, MRI images on PACS  _________________________________   HISTORICAL  CHIEF COMPLAINT:  Chief Complaint  Patient presents with  . Daytime Fatigue    Would like to discuss results of sleep study.  He is scheduled to see his dentist next week and will discuss oral appliance further with him/fim  . Sleep Apnea    HISTORY OF PRESENT ILLNESS:  Chad Miller is a 76 year old man reporting difficulty with his gait, dizziness and back pain who also has OSA.  OSA:    He has mild OSA on the recent PSG but had poor sleep with no REM or Slow wave sleep.  Therefore, it is possible that the sleep apnea is more moderate. Regardless, he has some daytime sleepiness as well so we discussed that treating his sleep apnea may help this. Because he OSA is mild he would prefer an oral appliance and we have given him the names of several dentists who can make them.  Insomnia:   He was having trouble falling asleep and staying asleep. This has improved on 5 mg cyclobenzaprine at bedtime. We discussed that this is not controlled substance and would not be addictive.  Gait:    He has difficulties with his gait and this is probably multifactorial.    MRI of the brain has shown confluent white matter changes in the periatrial white matter (advanced chronic microvascular ischemic change).   MRI of the cervical spine did not show any spinal stenosis and the spinal cord appeared normal but he did have multilevel degenerative change. MRI of the lumbar spine shows multilevel spinal stenosis and scoliosis with severe degenerative changes throughout. Right L2, right L3, left L4, bilateral L5 and left S1 nerve root compression due to the degenerative changes.  He feels his gait is stable. He had no tenderness slowly progressive gait disturbance  over the past 3 or 4 years.   On a treadmill, he can walk 30 minutes standing up straight and has only mild discomfort.     He has urinary frequency and urgency with nocturia 2-3 times a night that improved with the cyclobenzaprine.     EPWORTH SLEEPINESS SCALE  On a scale of 0 - 3 what is the chance of dozing:  Sitting and Reading:   3 Watching TV:    3 Sitting inactive in a public place: 0 Passenger in car for one hour: 1 Lying down to rest in the afternoon: 1 Sitting and talking to someone: 0 Sitting quietly after lunch:  3 In a car, stopped in traffic:  0  Total (out of 24):   11/24    REVIEW OF SYSTEMS: Constitutional: No fevers, chills, sweats, or change in appetite.   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  No eye pain Ear, nose and throat: No hearing loss, ear pain, nasal congestion, sore throat Cardiovascular: No chest pain, palpitations Respiratory: No shortness of breath at rest or with exertion.   No wheezes.   He snores GastrointestinaI: No nausea, vomiting, diarrhea, abdominal pain, fecal incontinence Genitourinary: as above . Musculoskeletal: Mild back pain Integumentary: No rash, pruritus, skin lesions Neurological: as above Psychiatric: No depression at this time.  No anxiety Endocrine: No palpitations, diaphoresis, change in appetite, change in weigh or increased thirst Hematologic/Lymphatic: No anemia, purpura, petechiae. Allergic/Immunologic: No itchy/runny eyes, nasal congestion, recent allergic reactions, rashes  ALLERGIES: No Known Allergies  HOME MEDICATIONS:  Current Outpatient Prescriptions:  .  aspirin EC 81 MG tablet, Take 81 mg by mouth daily., Disp: , Rfl:  .  atorvastatin (LIPITOR) 10 MG tablet, Take 10 mg by mouth daily., Disp: , Rfl:  .  CIALIS 20 MG tablet, , Disp: , Rfl:  .  co-enzyme Q-10 30 MG capsule, Take 30 mg by mouth daily., Disp: , Rfl:  .  cyclobenzaprine (FLEXERIL) 5 MG tablet, Take 1 tablet (5 mg total) by mouth at bedtime.,  Disp: 30 tablet, Rfl: 11 .  Multiple Vitamin (MULTIVITAMIN) tablet, Take 1 tablet by mouth daily., Disp: , Rfl:  .  olmesartan (BENICAR) 5 MG tablet, Take 10 mg by mouth daily., Disp: , Rfl:   PAST MEDICAL HISTORY: Past Medical History:  Diagnosis Date  . High blood pressure   . High cholesterol   . Loss of vision    Right    PAST SURGICAL HISTORY: Past Surgical History:  Procedure Laterality Date  . REPLACEMENT TOTAL KNEE Left 2004    FAMILY HISTORY: Family History  Problem Relation Age of Onset  . Heart disease Mother   . Lung cancer Father   . Heart disease Brother   . Heart disease Brother     SOCIAL HISTORY:  Social History   Social History  . Marital status: Married    Spouse name: N/A  . Number of children: 2  . Years of education: College   Occupational History  . Retired    Social History Main Topics  . Smoking status: Never Smoker  . Smokeless tobacco: Never Used  . Alcohol use No     Comment: Quit 2015  . Drug use: No  . Sexual activity: Not on file     Comment: Married   Other Topics Concern  . Not on file   Social History Narrative   Lives at home w/ his wife   Right-handed   Caffeine: 1 cup per day     PHYSICAL EXAM  Vitals:   08/15/16 1027  BP: 138/65  Pulse: 72  Resp: 18  Weight: 165 lb (74.8 kg)  Height: 5\' 10"  (1.778 m)    Body mass index is 23.68 kg/m.   General: The patient is well-developed and well-nourished and in no acute distress   Musculoskeletal:  Back is non-tender to palpation  Neurologic Exam  Mental status: The patient is alert and oriented x 3 at the time of the examination. The patient has apparent normal recent and remote memory, with an apparently normal attention span and concentration ability.   Speech is normal.  Cranial nerves: Extraocular movements are full.  Right cornea is cloudy .  Facial strength is normal.  Trapezius and sternocleidomastoid strength is normal. No dysarthria is noted.  The  tongue is midline, and the patient has symmetric elevation of the soft palate. No obvious hearing deficits are noted.  Motor:  Muscle bulk is normal.   Tone is normal. Strength is  5 / 5 in all 4 extremities.   Sensory: Sensory testing is intact to pinprick, soft touch and vibration sensation in all 4 extremities.  Coordination: Cerebellar testing reveals good finger-nose-finger and good heel to shin bilaterally.  Gait and station: Station is normal.   Gait has wide stance and mildly short stride and he can turn 180 degree turn is 3 steps.     Romberg is negative.   Reflexes: Deep tendon reflexes are symmetric and normal bilaterally in arms,  3+ at the knees, right greater than left and 1 at the ankles.    DIAGNOSTIC DATA (LABS, IMAGING, TESTING) - I reviewed patient records, labs, notes, testing and imaging myself where available.      ASSESSMENT AND PLAN  Gait disturbance  OSA (obstructive sleep apnea)  Spinal stenosis of lumbar region without neurogenic claudication  Urinary urgency  White matter abnormality on MRI of brain   1.   We discussed the MRI of the lumbar spine I showed him the images. The changes at L5-S1 have progressed when compared to the 2012 MRI while the other levels are similar. There is potential for right L2, right L3, left L4, bilateral L5 and left S1 nerve root compression  2.   He will follow up with a dentist to have an oral appliance made for his sleep apnea.. 3.   He will continue to stay active and exercises as tolerated. 4.   Return prn if there are new or worsening neurologic symptoms.   Mazzie Brodrick A. Epimenio Foot, MD, PhD 08/15/2016, 11:39 AM Certified in Neurology, Clinical Neurophysiology, Sleep Medicine, Pain Medicine and Neuroimaging  [facility] [address], [address] [place], [place] [address] [phone]

## 2016-10-09 DIAGNOSIS — H40012 Open angle with borderline findings, low risk, left eye: Secondary | ICD-10-CM | POA: Diagnosis not present

## 2016-10-10 ENCOUNTER — Ambulatory Visit: Payer: Medicare Other | Admitting: Neurology

## 2016-11-29 DIAGNOSIS — R972 Elevated prostate specific antigen [PSA]: Secondary | ICD-10-CM | POA: Diagnosis not present

## 2016-11-29 DIAGNOSIS — Z0001 Encounter for general adult medical examination with abnormal findings: Secondary | ICD-10-CM | POA: Diagnosis not present

## 2016-12-06 ENCOUNTER — Other Ambulatory Visit: Payer: Self-pay

## 2016-12-06 DIAGNOSIS — N401 Enlarged prostate with lower urinary tract symptoms: Secondary | ICD-10-CM | POA: Diagnosis not present

## 2016-12-06 DIAGNOSIS — R350 Frequency of micturition: Secondary | ICD-10-CM | POA: Diagnosis not present

## 2016-12-16 DIAGNOSIS — E559 Vitamin D deficiency, unspecified: Secondary | ICD-10-CM | POA: Diagnosis not present

## 2016-12-16 DIAGNOSIS — R7309 Other abnormal glucose: Secondary | ICD-10-CM | POA: Diagnosis not present

## 2016-12-16 DIAGNOSIS — I129 Hypertensive chronic kidney disease with stage 1 through stage 4 chronic kidney disease, or unspecified chronic kidney disease: Secondary | ICD-10-CM | POA: Diagnosis not present

## 2016-12-16 DIAGNOSIS — N182 Chronic kidney disease, stage 2 (mild): Secondary | ICD-10-CM | POA: Diagnosis not present

## 2016-12-16 DIAGNOSIS — Z0000 Encounter for general adult medical examination without abnormal findings: Secondary | ICD-10-CM | POA: Diagnosis not present

## 2016-12-16 DIAGNOSIS — M545 Low back pain: Secondary | ICD-10-CM | POA: Diagnosis not present

## 2017-01-28 DIAGNOSIS — Z23 Encounter for immunization: Secondary | ICD-10-CM | POA: Diagnosis not present

## 2017-03-20 DIAGNOSIS — K219 Gastro-esophageal reflux disease without esophagitis: Secondary | ICD-10-CM | POA: Diagnosis not present

## 2017-03-20 DIAGNOSIS — R1310 Dysphagia, unspecified: Secondary | ICD-10-CM | POA: Diagnosis not present

## 2017-03-20 DIAGNOSIS — K449 Diaphragmatic hernia without obstruction or gangrene: Secondary | ICD-10-CM | POA: Diagnosis not present

## 2017-03-20 DIAGNOSIS — K5730 Diverticulosis of large intestine without perforation or abscess without bleeding: Secondary | ICD-10-CM | POA: Diagnosis not present

## 2017-04-07 DIAGNOSIS — H40012 Open angle with borderline findings, low risk, left eye: Secondary | ICD-10-CM | POA: Diagnosis not present

## 2017-04-07 DIAGNOSIS — H2512 Age-related nuclear cataract, left eye: Secondary | ICD-10-CM | POA: Diagnosis not present

## 2017-04-09 DIAGNOSIS — K2970 Gastritis, unspecified, without bleeding: Secondary | ICD-10-CM | POA: Diagnosis not present

## 2017-04-09 DIAGNOSIS — R1310 Dysphagia, unspecified: Secondary | ICD-10-CM | POA: Diagnosis not present

## 2017-04-09 DIAGNOSIS — K449 Diaphragmatic hernia without obstruction or gangrene: Secondary | ICD-10-CM | POA: Diagnosis not present

## 2017-04-09 DIAGNOSIS — K210 Gastro-esophageal reflux disease with esophagitis: Secondary | ICD-10-CM | POA: Diagnosis not present

## 2017-05-08 DIAGNOSIS — K449 Diaphragmatic hernia without obstruction or gangrene: Secondary | ICD-10-CM | POA: Diagnosis not present

## 2017-05-08 DIAGNOSIS — K5730 Diverticulosis of large intestine without perforation or abscess without bleeding: Secondary | ICD-10-CM | POA: Diagnosis not present

## 2017-05-08 DIAGNOSIS — K210 Gastro-esophageal reflux disease with esophagitis: Secondary | ICD-10-CM | POA: Diagnosis not present

## 2017-06-19 DIAGNOSIS — E785 Hyperlipidemia, unspecified: Secondary | ICD-10-CM | POA: Diagnosis not present

## 2017-06-19 DIAGNOSIS — I129 Hypertensive chronic kidney disease with stage 1 through stage 4 chronic kidney disease, or unspecified chronic kidney disease: Secondary | ICD-10-CM | POA: Diagnosis not present

## 2017-06-19 DIAGNOSIS — J209 Acute bronchitis, unspecified: Secondary | ICD-10-CM | POA: Diagnosis not present

## 2017-06-19 DIAGNOSIS — R7309 Other abnormal glucose: Secondary | ICD-10-CM | POA: Diagnosis not present

## 2017-06-19 DIAGNOSIS — N182 Chronic kidney disease, stage 2 (mild): Secondary | ICD-10-CM | POA: Diagnosis not present

## 2017-07-17 DIAGNOSIS — K210 Gastro-esophageal reflux disease with esophagitis: Secondary | ICD-10-CM | POA: Diagnosis not present

## 2017-07-17 DIAGNOSIS — K449 Diaphragmatic hernia without obstruction or gangrene: Secondary | ICD-10-CM | POA: Diagnosis not present

## 2017-08-11 DIAGNOSIS — I129 Hypertensive chronic kidney disease with stage 1 through stage 4 chronic kidney disease, or unspecified chronic kidney disease: Secondary | ICD-10-CM | POA: Diagnosis not present

## 2017-08-11 DIAGNOSIS — N182 Chronic kidney disease, stage 2 (mild): Secondary | ICD-10-CM | POA: Diagnosis not present

## 2017-09-22 ENCOUNTER — Ambulatory Visit (INDEPENDENT_AMBULATORY_CARE_PROVIDER_SITE_OTHER): Payer: Medicare Other | Admitting: Orthopedic Surgery

## 2017-09-24 ENCOUNTER — Ambulatory Visit (INDEPENDENT_AMBULATORY_CARE_PROVIDER_SITE_OTHER): Payer: Medicare Other

## 2017-09-24 ENCOUNTER — Ambulatory Visit (INDEPENDENT_AMBULATORY_CARE_PROVIDER_SITE_OTHER): Payer: Medicare Other | Admitting: Orthopedic Surgery

## 2017-09-24 ENCOUNTER — Encounter (INDEPENDENT_AMBULATORY_CARE_PROVIDER_SITE_OTHER): Payer: Self-pay | Admitting: Orthopedic Surgery

## 2017-09-24 VITALS — Ht 69.0 in | Wt 165.0 lb

## 2017-09-24 DIAGNOSIS — Z96652 Presence of left artificial knee joint: Secondary | ICD-10-CM

## 2017-09-24 MED ORDER — AMOXICILLIN 500 MG PO TABS: ORAL_TABLET | ORAL | 0 refills | Status: DC

## 2017-09-24 NOTE — Progress Notes (Signed)
Office Visit Note   Patient: Chad Miller           Date of Birth: Dec 20, 1940           MRN: [id number] Visit Date: 09/24/2017 Requested by: [name], MD [address] [place], [place] [address] PCP: [name], MD  Subjective: Chief Complaint  Patient presents with  . Left Knee - Follow-up  . Lower Back - Follow-up    HPI: Chad Miller is a patient with left knee pain.  Having little stiffness in that left knee.  Has had a left total knee replacement done years ago.  Last visit was a year ago.  Radiographs are compared between the 2 visits.  He does do exercises.  He is seeing a neurologist for balance issues.  Describes stiffness in the back but not much in the way of pain.  He does have dental work coming up.  Denies any fevers or chills.              ROS: All systems reviewed are negative as they relate to the chief complaint within the history of present illness.  Patient denies  fevers or chills.   Assessment & Plan: Visit Diagnoses:  1. Presence of left artificial knee joint     Plan: Impression is well-functioning left total knee replacement with no radiographic changes concerning for ostial lysis or particle wear.  I will cut him loose at this time.  Follow-up with me as needed.  I do want to call him in some antibiotics because he has extensive dental work coming up in the near future.  Follow-Up Instructions: No follow-ups on file.   Orders:  Orders Placed This Encounter  Procedures  . XR KNEE 3 VIEW LEFT   No orders of the defined types were placed in this encounter.     Procedures: No procedures performed   Clinical Data: No additional findings.  Objective: Vital Signs: Ht  (1.753 m)   Wt 165 lb (74.8 kg)   BMI 24.37 kg/m   Physical Exam:   Constitutional: Patient appears well-developed HEENT:  Head: Normocephalic Eyes:EOM are normal Neck: Normal range of motion Cardiovascular: Normal rate Pulmonary/chest: Effort  normal Neurologic: Patient is alert Skin: Skin is warm Psychiatric: Patient has normal mood and affect    Ortho Exam: Orthopedic exam demonstrates excellent range of motion of the left knee.  Pedal pulses palpable.  No pain with range of motion.  No effusion or warmth in the left knee.  No tenderness to palpation around the tibial plateau or distal femur  Specialty Comments:  No specialty comments available.  Imaging: Xr Knee 3 View Left  Result Date: 09/24/2017 AP lateral merchant left knee reviewed.  Slight varus alignment of the tibial component present.  No fracture or dislocation is seen.  Total knee prosthesis in good position and alignment with no evidence of any type of cystic or degenerative change within the bone around the prosthesis    PMFS History: Patient Active Problem List   Diagnosis Date Noted  . OSA (obstructive sleep apnea) 08/15/2016  . 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 Snoring 07/05/2016  . Excessive daytime sleepiness 07/05/2016  . 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 Gait disturbance 04/22/2016  . Vertigo 04/22/2016  . Urinary urgency 04/22/2016  . Spinal stenosis of lumbar region 04/22/2016  . 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 High blood pressure   .  High cholesterol   . Loss of vision    Right    Family History  Problem Relation Age of Onset  . Heart disease Mother   . Lung cancer Father   . Heart disease Brother   . Heart disease Brother     Past Surgical History:  Procedure Laterality Date  . REPLACEMENT TOTAL KNEE Left 2004   Social History   Occupational History  . Occupation: Retired  Tobacco Use  . Smoking status: Never Smoker  . Smokeless tobacco: Never Used  Substance and Sexual Activity  . Alcohol use: No    Comment: Quit 2015  . Drug use: No  . Sexual activity: Not on file    Comment: Married

## 2017-09-24 NOTE — Addendum Note (Signed)
Addended byPrescott Parma on: 09/24/2017 03:05 PM   Modules accepted: Orders

## 2017-10-22 DIAGNOSIS — H40012 Open angle with borderline findings, low risk, left eye: Secondary | ICD-10-CM | POA: Diagnosis not present

## 2017-10-22 DIAGNOSIS — H25012 Cortical age-related cataract, left eye: Secondary | ICD-10-CM | POA: Diagnosis not present

## 2017-10-28 DIAGNOSIS — N182 Chronic kidney disease, stage 2 (mild): Secondary | ICD-10-CM | POA: Diagnosis not present

## 2017-10-28 DIAGNOSIS — J029 Acute pharyngitis, unspecified: Secondary | ICD-10-CM | POA: Diagnosis not present

## 2017-10-28 DIAGNOSIS — I129 Hypertensive chronic kidney disease with stage 1 through stage 4 chronic kidney disease, or unspecified chronic kidney disease: Secondary | ICD-10-CM | POA: Diagnosis not present

## 2017-10-28 DIAGNOSIS — J309 Allergic rhinitis, unspecified: Secondary | ICD-10-CM | POA: Diagnosis not present

## 2017-11-03 DIAGNOSIS — R3912 Poor urinary stream: Secondary | ICD-10-CM | POA: Diagnosis not present

## 2017-11-03 DIAGNOSIS — N401 Enlarged prostate with lower urinary tract symptoms: Secondary | ICD-10-CM | POA: Diagnosis not present

## 2017-11-03 DIAGNOSIS — R3915 Urgency of urination: Secondary | ICD-10-CM | POA: Diagnosis not present

## 2017-12-11 ENCOUNTER — Ambulatory Visit (INDEPENDENT_AMBULATORY_CARE_PROVIDER_SITE_OTHER): Payer: Medicare Other | Admitting: Allergy

## 2017-12-11 ENCOUNTER — Encounter: Payer: Self-pay | Admitting: Allergy

## 2017-12-11 VITALS — BP 126/68 | HR 60 | Resp 16 | Ht 69.0 in | Wt 166.0 lb

## 2017-12-11 DIAGNOSIS — J301 Allergic rhinitis due to pollen: Secondary | ICD-10-CM | POA: Diagnosis not present

## 2017-12-11 DIAGNOSIS — R059 Cough, unspecified: Secondary | ICD-10-CM

## 2017-12-11 DIAGNOSIS — R05 Cough: Secondary | ICD-10-CM

## 2017-12-11 DIAGNOSIS — W57XXXA Bitten or stung by nonvenomous insect and other nonvenomous arthropods, initial encounter: Secondary | ICD-10-CM | POA: Diagnosis not present

## 2017-12-11 MED ORDER — AZELASTINE HCL 0.1 % NA SOLN: 2.0000 | Freq: Two times a day (BID) | NASAL | 5 refills | Status: DC

## 2017-12-11 NOTE — Patient Instructions (Addendum)
Allergic rhinitis with significant postnasal drip  - Environmental allergy skin prick testing today is positive to grasses, dust mites Allergen avoidance measures discussed and provided today  - Continue loratadine 10 mg daily  - Continue mometasone nasal spray 2 sprays each nostril daily.  Use for 1 to 2 weeks at a time before stopping once symptoms improve  - For nasal drainage/postnasal drip recommend use of nasal antihistamine, Astelin, 2 sprays each nostril up to twice a day  - Continue nasal saline rinse  Insect bite reaction   - Use appropriate clothing and bug repellent to help decrease bites or stings  - Ice affected area  - Oral antihistamine (ie. Loratadine)  - Oral anti-inflammatory (ibuprofen)  - Topical corticosteroid (Hydrocortisone cream 1%)  Follow-up 3-4 months or sooner if needed

## 2017-12-11 NOTE — Progress Notes (Addendum)
New Patient Note  RE: Chad ChickOliver Derhammer MRN: [id number] DOB: [birth year] Date of Office Visit: 12/11/2017  Referring provider: Dorothyann PengSanders, Robyn, MD Primary care provider: Dorothyann PengSanders, Robyn, MD  Chief Complaint: sinus symptoms.    History of present illness: Chad Miller is a 77 y.o. male presenting today for consultation for allergic rhinitis.     For months he has had a sensation of sinus pressure and ear fullness and a lot of phlegm and cough.  Reports the phlegm is clear to white.  He uses a nasal saline solution to help with the drainage.  He also report he has a throat irritation and describes it like the start of a sore throat that never fully progresses.  He does report throat clearing and cough throughout the day.   Symptoms are worse during hot/humid days, fall and when he has been doing yard-work.    He has been taking loratadine daily for couple months and does feel symptoms have been a little worse over the past 3 days off loratadine for testing today.  He uses mometasone nasal spray daily however states he if he having a good day he may not use the spray.  He was on flonase prior to mometasone that was less effective.  He uses a vicks vaporizer.    He takes ranitidine daily before dinner which he states does help with reflux control.      He has no history of asthma, eczema or food allergy.  He has never used any inhaler or nebulizer medications.     He also reports he gets dark marks after insect bites. This has been occurring for years but has noticed the hyperpigmentation moreso thus summer with bites.    Review of systems: Review of Systems  Constitutional: Negative for chills, fever and malaise/fatigue.  HENT: Positive for congestion, ear pain, sinus pain and sore throat. Negative for ear discharge, hearing loss, nosebleeds and tinnitus.   Eyes: Negative for pain, discharge and redness.  Respiratory: Positive for cough. Negative for sputum production, shortness of  breath, wheezing and stridor.   Cardiovascular: Negative for chest pain.  Gastrointestinal: Negative for abdominal pain, constipation, diarrhea, heartburn, nausea and vomiting.  Musculoskeletal: Negative for joint pain.  Skin: Negative for itching and rash.  Neurological: Negative for headaches.    All other systems negative unless noted above in HPI   Past medical history: Past Medical History:  Diagnosis Date  . High blood pressure   . High cholesterol   . Loss of vision    Right    Past surgical history: Past Surgical History:  Procedure Laterality Date  . CATARACT EXTRACTION    . REPLACEMENT TOTAL KNEE Left 2004    Family history:  Family History  Problem Relation Age of Onset  . Heart disease Mother   . Lung cancer Father   . Heart disease Brother   . Heart disease Brother   . Asthma Sister   . Allergic rhinitis Neg Hx   . Eczema Neg Hx   . Urticaria Neg Hx     Social history: Lives in a home with carpeting with gas heating and central cooling.  No pets in the home.  No concern for water damage, mildew or roaches in the home.  He is retired.  Denies smoking history.  Medication List: Allergies as of 12/11/2017   No Known Allergies     Medication List        Accurate as of 12/11/17  4:02 PM. Always  use your most recent med list.          aspirin EC 81 MG tablet Take 81 mg by mouth daily.   atorvastatin 10 MG tablet Commonly known as:  LIPITOR Take 10 mg by mouth daily.   CIALIS 20 MG tablet Generic drug:  tadalafil   co-enzyme Q-10 30 MG capsule Take 30 mg by mouth daily.   cyclobenzaprine 5 MG tablet Commonly known as:  FLEXERIL Take 1 tablet (5 mg total) by mouth at bedtime.   loratadine 10 MG tablet Commonly known as:  CLARITIN Take 10 mg by mouth daily.   mometasone 50 MCG/ACT nasal spray Commonly known as:  NASONEX Place 2 sprays into the nose daily.   multivitamin tablet Take 1 tablet by mouth daily.   olmesartan 5 MG  tablet Commonly known as:  BENICAR Take 10 mg by mouth daily.   Omega 3 1000 MG Caps Take by mouth.   ranitidine 150 MG tablet Commonly known as:  ZANTAC Take 150 mg by mouth 2 (two) times daily.       Known medication allergies: No Known Allergies   Physical examination: Blood pressure 126/68, pulse 60, resp. rate 16, height 5\' 9"  (1.753 m), weight 166 lb (75.3 kg).  General: Alert, interactive, in no acute distress. HEENT: PERRLA, TMs pearly gray, turbinates moderately edematous with clear discharge, post-pharynx non erythematous. Neck: Supple without lymphadenopathy. Lungs: Clear to auscultation without wheezing, rhonchi or rales. {no increased work of breathing. CV: Normal S1, S2 without murmurs. Abdomen: Nondistended, nontender. Skin: Several hyperpigmented macules over his lower extremities. Extremities:  No clubbing, cyanosis or edema. Neuro:   Grossly intact.  Diagnositics/Labs:  Spirometry: FEV1: 2.71L 104%, FVC: 3.36L 95%, ratio consistent with Nonobstructive pattern  Allergy testing: Environmental allergy skin prick testing is positive to grasses and dust mite Intradermal testing is negative Allergy testing results were read and interpreted by provider, documented by clinical staff.   Assessment and plan:   Allergic rhinitis with significant postnasal drip  - Environmental allergy skin prick testing today is positive to grasses, dust mites Allergen avoidance measures discussed and provided today  - Continue loratadine 10 mg daily  - Continue mometasone nasal spray 2 sprays each nostril daily.  Use for 1 to 2 weeks at a time before stopping once symptoms improve  - For nasal drainage/postnasal drip recommend use of nasal antihistamine, Astelin, 2 sprays each nostril up to twice a day  - Continue nasal saline rinse  -Consider addition of Singulair if nasal antihistamine spray is not effective in controlling his postnasal drainage  Cough   -Symptoms appear  most consistent with cough due to postnasal drainage.  Will treat drainage as above which should help decrease cough symptoms.  He does not have symptoms consistent with an obstructive or restrictive lung disease at this time.  Lung function testing is normal today.  Insect bite reaction   - Use appropriate clothing and bug repellent to help decrease bites or stings  - Ice affected area  - Oral antihistamine (ie. Loratadine)  - Oral anti-inflammatory (ibuprofen)  - Topical corticosteroid (Hydrocortisone cream 1%)  Follow-up 3-4 months or sooner if needed  I appreciate the opportunity to take part in Shloime's care. Please do not hesitate to contact me with questions.  Sincerely,   Margo Aye, MD Allergy/Immunology [facility]

## 2017-12-17 DIAGNOSIS — R350 Frequency of micturition: Secondary | ICD-10-CM | POA: Diagnosis not present

## 2017-12-17 DIAGNOSIS — N401 Enlarged prostate with lower urinary tract symptoms: Secondary | ICD-10-CM | POA: Diagnosis not present

## 2018-01-07 DIAGNOSIS — Z0000 Encounter for general adult medical examination without abnormal findings: Secondary | ICD-10-CM | POA: Diagnosis not present

## 2018-01-07 DIAGNOSIS — N401 Enlarged prostate with lower urinary tract symptoms: Secondary | ICD-10-CM | POA: Diagnosis not present

## 2018-01-07 DIAGNOSIS — M545 Low back pain: Secondary | ICD-10-CM | POA: Diagnosis not present

## 2018-01-07 DIAGNOSIS — E559 Vitamin D deficiency, unspecified: Secondary | ICD-10-CM | POA: Diagnosis not present

## 2018-01-07 DIAGNOSIS — N182 Chronic kidney disease, stage 2 (mild): Secondary | ICD-10-CM | POA: Diagnosis not present

## 2018-01-07 DIAGNOSIS — R7303 Prediabetes: Secondary | ICD-10-CM | POA: Diagnosis not present

## 2018-01-07 DIAGNOSIS — I129 Hypertensive chronic kidney disease with stage 1 through stage 4 chronic kidney disease, or unspecified chronic kidney disease: Secondary | ICD-10-CM | POA: Diagnosis not present

## 2018-01-15 DIAGNOSIS — Z23 Encounter for immunization: Secondary | ICD-10-CM | POA: Diagnosis not present

## 2018-02-06 DIAGNOSIS — Z23 Encounter for immunization: Secondary | ICD-10-CM | POA: Diagnosis not present

## 2018-04-10 ENCOUNTER — Ambulatory Visit: Payer: Medicare Other | Admitting: Allergy

## 2018-04-27 DIAGNOSIS — H52202 Unspecified astigmatism, left eye: Secondary | ICD-10-CM | POA: Diagnosis not present

## 2018-04-27 DIAGNOSIS — H25012 Cortical age-related cataract, left eye: Secondary | ICD-10-CM | POA: Diagnosis not present

## 2018-06-23 ENCOUNTER — Other Ambulatory Visit (INDEPENDENT_AMBULATORY_CARE_PROVIDER_SITE_OTHER): Payer: Self-pay | Admitting: Orthopedic Surgery

## 2018-06-23 NOTE — Telephone Encounter (Signed)
Y thx

## 2018-06-23 NOTE — Telephone Encounter (Signed)
Ok for refill? 

## 2018-07-15 ENCOUNTER — Ambulatory Visit (INDEPENDENT_AMBULATORY_CARE_PROVIDER_SITE_OTHER): Payer: Medicare Other | Admitting: Internal Medicine

## 2018-07-15 ENCOUNTER — Encounter: Payer: Self-pay | Admitting: Internal Medicine

## 2018-07-15 ENCOUNTER — Other Ambulatory Visit: Payer: Self-pay

## 2018-07-15 VITALS — BP 134/72 | HR 78 | Temp 97.7°F | Ht 68.6 in | Wt 167.2 lb

## 2018-07-15 DIAGNOSIS — E7800 Pure hypercholesterolemia, unspecified: Secondary | ICD-10-CM | POA: Diagnosis not present

## 2018-07-15 DIAGNOSIS — Z79899 Other long term (current) drug therapy: Secondary | ICD-10-CM | POA: Diagnosis not present

## 2018-07-15 DIAGNOSIS — R350 Frequency of micturition: Secondary | ICD-10-CM

## 2018-07-15 DIAGNOSIS — J3089 Other allergic rhinitis: Secondary | ICD-10-CM | POA: Diagnosis not present

## 2018-07-15 DIAGNOSIS — I129 Hypertensive chronic kidney disease with stage 1 through stage 4 chronic kidney disease, or unspecified chronic kidney disease: Secondary | ICD-10-CM | POA: Diagnosis not present

## 2018-07-15 DIAGNOSIS — N182 Chronic kidney disease, stage 2 (mild): Secondary | ICD-10-CM | POA: Diagnosis not present

## 2018-07-15 DIAGNOSIS — R7309 Other abnormal glucose: Secondary | ICD-10-CM

## 2018-07-15 LAB — POCT URINALYSIS DIPSTICK
Bilirubin, UA: NEGATIVE
Blood, UA: NEGATIVE
Glucose, UA: NEGATIVE
Ketones, UA: NEGATIVE
Leukocytes, UA: NEGATIVE
Nitrite, UA: NEGATIVE
Protein, UA: NEGATIVE
Spec Grav, UA: 1.015 (ref 1.010–1.025)
Urobilinogen, UA: 0.2 E.U./dL
pH, UA: 5.5 (ref 5.0–8.0)

## 2018-07-15 MED ORDER — TADALAFIL 5 MG PO TABS: 5.0000 mg | ORAL_TABLET | Freq: Every day | ORAL | 0 refills | Status: DC | PRN

## 2018-07-15 MED ORDER — OLMESARTAN MEDOXOMIL 20 MG PO TABS: 20.0000 mg | ORAL_TABLET | Freq: Every day | ORAL | 1 refills | Status: DC

## 2018-07-15 NOTE — Patient Instructions (Signed)

## 2018-07-15 NOTE — Progress Notes (Signed)
Subjective:     Patient ID: Chad Miller , male    DOB: 18-Dec-1940 , 78 y.o.   MRN: [id number]   Chief Complaint  Patient presents with  . Hypertension    HPI  Hypertension  This is a chronic problem. The current episode started more than 1 year ago. The problem has been gradually improving since onset. The problem is controlled. Pertinent negatives include no blurred vision, chest pain or shortness of breath. Risk factors for coronary artery disease include dyslipidemia and male gender. Past treatments include angiotensin blockers. Hypertensive end-organ damage includes kidney disease.     Past Medical History:  Diagnosis Date  . High blood pressure   . High cholesterol   . Loss of vision    Right     Family History  Problem Relation Age of Onset  . Heart disease Mother   . Lung cancer Father   . Heart disease Brother   . Heart disease Brother   . Asthma Sister   . Allergic rhinitis Neg Hx   . Eczema Neg Hx   . Urticaria Neg Hx      Current Outpatient Medications:  .  aspirin EC 81 MG tablet, Take 81 mg by mouth daily., Disp: , Rfl:  .  atorvastatin (LIPITOR) 10 MG tablet, Take 10 mg by mouth daily., Disp: , Rfl:  .  azelastine (ASTELIN) 0.1 % nasal spray, Place 2 sprays into both nostrils 2 (two) times daily. Use in each nostril as directed, Disp: 30 mL, Rfl: 5 .  co-enzyme Q-10 30 MG capsule, Take 30 mg by mouth daily., Disp: , Rfl:  .  cyclobenzaprine (FLEXERIL) 5 MG tablet, Take 1 tablet (5 mg total) by mouth at bedtime., Disp: 30 tablet, Rfl: 11 .  loratadine (CLARITIN) 10 MG tablet, Take 10 mg by mouth daily., Disp: , Rfl:  .  mometasone (NASONEX) 50 MCG/ACT nasal spray, Place 2 sprays into the nose daily., Disp: , Rfl:  .  Multiple Vitamin (MULTIVITAMIN) tablet, Take 1 tablet by mouth daily., Disp: , Rfl:  .  Omega 3 1000 MG CAPS, Take by mouth., Disp: , Rfl:  .  ranitidine (ZANTAC) 150 MG tablet, Take 150 mg by mouth 2 (two) times daily., Disp: , Rfl:  .   amoxicillin (AMOXIL) 500 MG capsule, TAKE FOUR CAPSULES BY MOUTH ONE HOUR PRIOR TO PROCEDURE (Patient not taking: Reported on 07/15/2018), Disp: 16 capsule, Rfl: 0 .  olmesartan (BENICAR) 20 MG tablet, Take 1 tablet (20 mg total) by mouth daily., Disp: 90 tablet, Rfl: 1 .  tadalafil (CIALIS) 5 MG tablet, Take 1 tablet (5 mg total) by mouth daily as needed for erectile dysfunction., Disp: 10 tablet, Rfl: 0   No Known Allergies   Review of Systems  Constitutional: Negative.   HENT: Positive for postnasal drip.   Eyes: Negative for blurred vision.  Respiratory: Negative.  Negative for shortness of breath.   Cardiovascular: Negative.  Negative for chest pain.  Gastrointestinal: Negative.   Genitourinary: Positive for frequency.  Neurological: Negative.   Psychiatric/Behavioral: Negative.      Today's Vitals   07/15/18 0929  BP: 134/72  Pulse: 78  Temp: 97.7 F (36.5 C)  SpO2: 95%  Weight: 167 lb 3.2 oz (75.8 kg)  Height: 5' 8.6" (1.742 m)   Body mass index is 24.98 kg/m.   Objective:  Physical Exam Vitals signs and nursing note reviewed.  Constitutional:      Appearance: Normal appearance.  Cardiovascular:  Rate and Rhythm: Normal rate and regular rhythm.     Heart sounds: Normal heart sounds.  Pulmonary:     Effort: Pulmonary effort is normal.     Breath sounds: Normal breath sounds.  Skin:    General: Skin is warm.  Neurological:     General: No focal deficit present.     Mental Status: He is alert.  Psychiatric:        Mood and Affect: Mood normal.         Assessment And Plan:     1. Hypertensive nephropathy  Controlled. He will continue with current meds. He is encouraged to limit his salt intake and to remain active.   - CMP14+EGFR  2. Chronic renal disease, stage II  Chronic. I will check a GFr, Cr today.  3. Pure hypercholesterolemia  I will check lipid panel, LFTs today. He is encouraged to continue with current meds, exercise regularly and  avoid fried foods.   - Lipid panel  4. Non-seasonal allergic rhinitis, unspecified trigger  His notes from allergist, Chad Miller were reviewed. He has only been using astelin spray. He is encouraged to resume loratadine per her instructions.   5. Urinary frequency  I will check urinalysis today. He has h/o BPH.   6. Other abnormal glucose  HIS A1C HAS BEEN ELEVATED IN THE PAST. I WILL CHECK AN A1C, BMET TODAY. HE WAS ENCOURAGED TO AVOID SUGARY BEVERAGES AND PROCESSED FOODS INCLUDNG BREADS, RICE AND PASTA.  - Hemoglobin A1c  7. Drug therapy   Chad Greenland, MD

## 2018-07-16 LAB — LIPID PANEL
Chol/HDL Ratio: 2.6 ratio (ref 0.0–5.0)
Cholesterol, Total: 132 mg/dL (ref 100–199)
HDL: 51 mg/dL (ref >39)
LDL Calculated: 71 mg/dL (ref 0–99)
Triglycerides: 50 mg/dL (ref 0–149)
VLDL Cholesterol Cal: 10 mg/dL (ref 5–40)

## 2018-07-16 LAB — CMP14+EGFR
ALT: 22 IU/L (ref 0–44)
AST: 24 IU/L (ref 0–40)
Albumin/Globulin Ratio: 1.3 (ref 1.2–2.2)
Albumin: 4.3 g/dL (ref 3.7–4.7)
Alkaline Phosphatase: 75 IU/L (ref 39–117)
BUN/Creatinine Ratio: 11 (ref 10–24)
BUN: 10 mg/dL (ref 8–27)
Bilirubin Total: 1.1 mg/dL (ref 0.0–1.2)
CO2: 22 mmol/L (ref 20–29)
Calcium: 9.9 mg/dL (ref 8.6–10.2)
Chloride: 106 mmol/L (ref 96–106)
Creatinine, Ser: 0.92 mg/dL (ref 0.76–1.27)
GFR calc Af Amer: 92 mL/min/{1.73_m2} (ref >59)
GFR calc non Af Amer: 79 mL/min/{1.73_m2} (ref >59)
Globulin, Total: 3.4 g/dL (ref 1.5–4.5)
Glucose: 117 mg/dL — ABNORMAL HIGH (ref 65–99)
Potassium: 4.2 mmol/L (ref 3.5–5.2)
Sodium: 143 mmol/L (ref 134–144)
Total Protein: 7.7 g/dL (ref 6.0–8.5)

## 2018-07-16 LAB — HEMOGLOBIN A1C
Est. average glucose Bld gHb Est-mCnc: 117 mg/dL
Hgb A1c MFr Bld: 5.7 % — ABNORMAL HIGH (ref 4.8–5.6)

## 2018-08-31 ENCOUNTER — Other Ambulatory Visit: Payer: Self-pay

## 2018-08-31 MED ORDER — ATORVASTATIN CALCIUM 10 MG PO TABS: 10.0000 mg | ORAL_TABLET | Freq: Every day | ORAL | 1 refills | Status: DC

## 2018-09-01 ENCOUNTER — Other Ambulatory Visit: Payer: Self-pay | Admitting: Internal Medicine

## 2018-12-21 DIAGNOSIS — N401 Enlarged prostate with lower urinary tract symptoms: Secondary | ICD-10-CM | POA: Diagnosis not present

## 2018-12-21 DIAGNOSIS — R350 Frequency of micturition: Secondary | ICD-10-CM | POA: Diagnosis not present

## 2019-02-03 ENCOUNTER — Ambulatory Visit: Payer: Medicare Other

## 2019-02-03 ENCOUNTER — Ambulatory Visit: Payer: Medicare Other | Admitting: Internal Medicine

## 2019-02-04 DIAGNOSIS — Z23 Encounter for immunization: Secondary | ICD-10-CM | POA: Diagnosis not present

## 2019-02-09 ENCOUNTER — Encounter: Payer: Self-pay | Admitting: Internal Medicine

## 2019-02-12 ENCOUNTER — Encounter: Payer: Self-pay | Admitting: Orthopedic Surgery

## 2019-02-12 ENCOUNTER — Ambulatory Visit: Payer: Self-pay

## 2019-02-12 ENCOUNTER — Ambulatory Visit: Payer: Medicare Other

## 2019-02-12 ENCOUNTER — Ambulatory Visit (INDEPENDENT_AMBULATORY_CARE_PROVIDER_SITE_OTHER): Payer: Medicare Other | Admitting: Orthopedic Surgery

## 2019-02-12 DIAGNOSIS — M79605 Pain in left leg: Secondary | ICD-10-CM

## 2019-02-14 ENCOUNTER — Encounter: Payer: Self-pay | Admitting: Orthopedic Surgery

## 2019-02-14 NOTE — Progress Notes (Signed)
Office Visit Note   Patient: Chad Miller           Date of Birth: 1941-04-24           MRN: [id number] Visit Date: 02/12/2019 Requested by: [name], [address] [place],  [place] [id number] PCP: [name], MD  Subjective: Chief Complaint  Patient presents with  . Left Leg - Pain    HPI: Chad Miller is a 78 y.o. male who presents to the office complaining of left knee and left hip pain.  Patient notes 4 to 49-month history of worsening left knee pain.  Pain radiates to the left hip.  He localizes the knee pain to the proximal aspect of the left knee.  Pain is worse with walking and he is now walking with a cane over the past couple months.  Patient has a history of a TKA in 2004 in [place].  He notes low back pain and groin pain.  Denies any numbness or tingling aside from some occasional numbness tingling in his toes.  Denies any fevers or chills.  Denies any significant swelling of the left knee.  Patient has a history of back pain.  MRI in 2012 revealed significant multilevel foraminal stenosis, most severe on the right at L2-L3 and L3-L4 and on the left at L4-L5.  MRI also revealed moderate central stenosis from L2-L3 through L5-S1.              ROS:  All systems reviewed are negative as they relate to the chief complaint within the history of present illness.  Patient denies fevers or chills.  Assessment & Plan: Visit Diagnoses:  1. Pain in left leg     Plan: Patient is a 78 year old male who presents complaining of left knee and left hip pain.  Patient has history of TKA in 2004 in [place].  He has good motion in the left knee with no concerning signs for infection on exam.  Impression is lumbar stenosis.  Patient has a history of significant stenosis 8 years ago that has likely progressed.  He is now walking with a cane.  Symptoms are worse with walking for long periods of time.  Ordered MRI of the L-spine to evaluate for stenosis.  Patient will  follow-up after MRI to review results.  Plan for MRI result guided ESI's by Dr. Ernestina Patches after reviewing MRI.  Follow-Up Instructions: No follow-ups on file.   Orders:  Orders Placed This Encounter  Procedures  . XR HIP UNILAT W OR W/O PELVIS 2-3 VIEWS LEFT  . XR Knee 1-2 Views Left   No orders of the defined types were placed in this encounter.     Procedures: No procedures performed   Clinical Data: No additional findings.  Objective: Vital Signs: There were no vitals taken for this visit.  Physical Exam:  Constitutional: Patient appears well-developed HEENT:  Head: Normocephalic Eyes:EOM are normal Neck: Normal range of motion Cardiovascular: Normal rate Pulmonary/chest: Effort normal Neurologic: Patient is alert Skin: Skin is warm Psychiatric: Patient has normal mood and affect  Ortho Exam:  Left knee Exam No effusion Extensor mechanism intact Mild TTP over the medial and lateral jointlines, quad tendon No TTP over patellar tendon, pes anserinus, patella, tibial tubercle, LCL/MCL insertions Stable to varus/valgus stresses.  Stable to anterior/posterior drawer Extension to 0 degrees Flexion > 90 degrees No significant warmth compared to the contralateral side  No tenderness to palpation throughout the axial lumbar spine Negative straight leg raise  5/5 motor strength of the bilateral lower extremities No pain with internal or external rotation of the left hip.  Specialty Comments:  No specialty comments available.  Imaging: No results found.   PMFS History: Patient Active Problem List   Diagnosis Date Noted  . OSA (obstructive sleep apnea) 08/15/2016  . 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 Snoring 07/05/2016  . Excessive daytime sleepiness 07/05/2016  . 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 Gait disturbance 04/22/2016  . Vertigo 04/22/2016  . Urinary urgency 04/22/2016  . Spinal stenosis of lumbar region 04/22/2016   . 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 High blood pressure   . High cholesterol   . Loss of vision    Right    Family History  Problem Relation Age of Onset  . Heart disease Mother   . Lung cancer Father   . Heart disease Brother   . Heart disease Brother   . Asthma Sister   . Allergic rhinitis Neg Hx   . Eczema Neg Hx   . Urticaria Neg Hx     Past Surgical History:  Procedure Laterality Date  . CATARACT EXTRACTION    . REPLACEMENT TOTAL KNEE Left 2004   Social History   Occupational History  . Occupation: Retired  Tobacco Use  . Smoking status: Never Smoker  . Smokeless tobacco: Never Used  Substance and Sexual Activity  . Alcohol use: No    Comment: Quit 2015  . Drug use: No  . Sexual activity: Not on file    Comment: Married

## 2019-02-22 ENCOUNTER — Telehealth: Payer: Self-pay | Admitting: *Deleted

## 2019-02-22 ENCOUNTER — Other Ambulatory Visit: Payer: Self-pay | Admitting: Orthopedic Surgery

## 2019-02-22 DIAGNOSIS — M4807 Spinal stenosis, lumbosacral region: Secondary | ICD-10-CM

## 2019-02-22 NOTE — Telephone Encounter (Signed)
Pt called stating he was in office latter part of Sept and was suppose to have a MRI placed and has not heard from anyone. I called pt back and advised him order was not placed for MRI LSP and that I will place it and then send it as Urgent so that he can get caled. Pt voiced understanding and will wait for the call.

## 2019-02-28 ENCOUNTER — Other Ambulatory Visit: Payer: Self-pay

## 2019-02-28 ENCOUNTER — Ambulatory Visit
Admission: RE | Admit: 2019-02-28 | Discharge: 2019-02-28 | Disposition: A | Discharge status: Home / Self Care | Payer: Medicare Other | Source: Ambulatory Visit | Attending: Orthopedic Surgery | Admitting: Orthopedic Surgery

## 2019-02-28 DIAGNOSIS — M4807 Spinal stenosis, lumbosacral region: Secondary | ICD-10-CM

## 2019-02-28 DIAGNOSIS — M48061 Spinal stenosis, lumbar region without neurogenic claudication: Secondary | ICD-10-CM | POA: Diagnosis not present

## 2019-03-10 ENCOUNTER — Encounter: Payer: Self-pay | Admitting: Orthopedic Surgery

## 2019-03-10 ENCOUNTER — Other Ambulatory Visit: Payer: Self-pay

## 2019-03-10 ENCOUNTER — Ambulatory Visit (INDEPENDENT_AMBULATORY_CARE_PROVIDER_SITE_OTHER): Payer: Medicare Other | Admitting: Orthopedic Surgery

## 2019-03-10 DIAGNOSIS — M79605 Pain in left leg: Secondary | ICD-10-CM | POA: Diagnosis not present

## 2019-03-10 NOTE — Progress Notes (Signed)
Office Visit Note   Patient: Chad Miller           Date of Birth: March 31, 1941           MRN: [id number] Visit Date: 03/10/2019 Requested by: [name], [address] [place],  [place] [id number] PCP: [name], MD  Subjective: Chief Complaint  Patient presents with  . Follow-up    HPI: Erminio is a patient with low back pain.  Presents for follow-up of MRI scan.  Back pain is becoming worse.  MRI scan shows extensive loss of disc height throughout the lumbar spine with facet arthritis and right-sided L2 foraminal stenosis which is severe.  He wants to avoid surgery at all cost.  His back pain is becoming severe however.  Denies any bowel and bladder symptoms.              ROS: All systems reviewed are negative as they relate to the chief complaint within the history of present illness.  Patient denies  fevers or chills.   Assessment & Plan: Visit Diagnoses:  1. 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  I think he would do well to consider epidural steroid injections with Dr. Ernestina Patches.  Follow-up with Dr. Ernestina Patches for consideration of that intervention. Follow-Up Instructions: No follow-ups on file.   Orders:  Orders Placed This Encounter  Procedures  . Ambulatory referral to Physical Medicine Rehab   No orders of the defined types were placed in this encounter.     Procedures: No procedures performed   Clinical Data: No additional findings.  Objective: Vital Signs: There were no vitals taken for this visit.  Physical Exam:   Constitutional: Patient appears well-developed HEENT:  Head: Normocephalic Eyes:EOM are normal Neck: Normal range of motion Cardiovascular: Normal rate Pulmonary/chest: Effort normal Neurologic: Patient is alert Skin: Skin is warm Psychiatric: Patient has normal mood and affect    Ortho Exam: Ortho  exam demonstrates good ankle dorsiflexion plantarflexion quad hamstring strength with no nerve root tension signs no groin pain.  No definite paresthesias L1 S1 bilaterally.  Pedal pulses palpable.  Reflexes symmetric bilateral patella and Achilles 1+ out of 4.  Specialty Comments:  No specialty comments available.  Imaging: No results found.   PMFS History: Patient Active Problem List   Diagnosis Date Noted  . OSA (obstructive sleep apnea) 08/15/2016  . 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 Snoring 07/05/2016  . Excessive daytime sleepiness 07/05/2016  . 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 Gait disturbance 04/22/2016  . Vertigo 04/22/2016  . Urinary urgency 04/22/2016  . Spinal stenosis of lumbar region 04/22/2016  . 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 High blood pressure   . High cholesterol   . Loss of vision    Right    Family History  Problem Relation Age of Onset  . Heart disease Mother   . Lung cancer Father   . Heart disease Brother   . Heart disease Brother   . Asthma Sister   . Allergic rhinitis Neg Hx   . Eczema Neg Hx   . Urticaria Neg Hx     Past Surgical History:  Procedure Laterality Date  . CATARACT EXTRACTION    . REPLACEMENT TOTAL KNEE Left 2004   Social History  Occupational History  . Occupation: Retired  Tobacco Use  . Smoking status: Never Smoker  . Smokeless tobacco: Never Used  Substance and Sexual Activity  . Alcohol use: No    Comment: Quit 2015  . Drug use: No  . Sexual activity: Not on file    Comment: Married

## 2019-03-18 ENCOUNTER — Ambulatory Visit: Payer: Medicare Other

## 2019-03-18 ENCOUNTER — Ambulatory Visit: Payer: Medicare Other | Admitting: Internal Medicine

## 2019-03-23 DIAGNOSIS — N401 Enlarged prostate with lower urinary tract symptoms: Secondary | ICD-10-CM | POA: Diagnosis not present

## 2019-03-23 DIAGNOSIS — R3915 Urgency of urination: Secondary | ICD-10-CM | POA: Diagnosis not present

## 2019-04-01 ENCOUNTER — Encounter: Payer: Self-pay | Admitting: Internal Medicine

## 2019-04-01 ENCOUNTER — Ambulatory Visit (INDEPENDENT_AMBULATORY_CARE_PROVIDER_SITE_OTHER): Payer: Medicare Other | Admitting: Internal Medicine

## 2019-04-01 ENCOUNTER — Other Ambulatory Visit: Payer: Self-pay

## 2019-04-01 ENCOUNTER — Ambulatory Visit (INDEPENDENT_AMBULATORY_CARE_PROVIDER_SITE_OTHER): Payer: Medicare Other

## 2019-04-01 VITALS — BP 130/76 | HR 71 | Temp 97.6°F | Ht 68.6 in | Wt 162.8 lb

## 2019-04-01 VITALS — BP 130/76 | HR 71 | Temp 97.6°F | Ht 68.6 in | Wt 162.0 lb

## 2019-04-01 DIAGNOSIS — I129 Hypertensive chronic kidney disease with stage 1 through stage 4 chronic kidney disease, or unspecified chronic kidney disease: Secondary | ICD-10-CM

## 2019-04-01 DIAGNOSIS — Z23 Encounter for immunization: Secondary | ICD-10-CM

## 2019-04-01 DIAGNOSIS — E7800 Pure hypercholesterolemia, unspecified: Secondary | ICD-10-CM

## 2019-04-01 DIAGNOSIS — Z0000 Encounter for general adult medical examination without abnormal findings: Secondary | ICD-10-CM | POA: Diagnosis not present

## 2019-04-01 DIAGNOSIS — R7309 Other abnormal glucose: Secondary | ICD-10-CM | POA: Diagnosis not present

## 2019-04-01 DIAGNOSIS — Z1211 Encounter for screening for malignant neoplasm of colon: Secondary | ICD-10-CM

## 2019-04-01 DIAGNOSIS — N182 Chronic kidney disease, stage 2 (mild): Secondary | ICD-10-CM | POA: Diagnosis not present

## 2019-04-01 LAB — POCT URINALYSIS DIPSTICK
Bilirubin, UA: NEGATIVE
Glucose, UA: NEGATIVE
Ketones, UA: NEGATIVE
Leukocytes, UA: NEGATIVE
Nitrite, UA: NEGATIVE
Protein, UA: NEGATIVE
Spec Grav, UA: 1.02 (ref 1.010–1.025)
Urobilinogen, UA: 0.2 E.U./dL
pH, UA: 5.5 (ref 5.0–8.0)

## 2019-04-01 LAB — POCT UA - MICROALBUMIN
Albumin/Creatinine Ratio, Urine, POC: <30
Creatinine, POC: 200 mg/dL
Microalbumin Ur, POC: 10 mg/L

## 2019-04-01 MED ORDER — PREVNAR 13 IM SUSP: 0.5000 mL | INTRAMUSCULAR | 0 refills | Status: DC

## 2019-04-01 NOTE — Patient Instructions (Signed)

## 2019-04-01 NOTE — Patient Instructions (Signed)
Chad Miller , Thank you for taking time to come for your Medicare Wellness Visit. I appreciate your ongoing commitment to your health goals. Please review the following plan we discussed and let me know if I can assist you in the future.   Screening recommendations/referrals: Colonoscopy: 06/2009 Recommended yearly ophthalmology/optometry visit for glaucoma screening and checkup Recommended yearly dental visit for hygiene and checkup  Vaccinations: Influenza vaccine: 01/2019 Pneumococcal vaccine: 12/2017 Tdap vaccine: 05/2011 Shingles vaccine: 07/2017    Advanced directives: Please bring a copy of your POA (Power of [place]) and/or Living Will to your next appointment.    Conditions/risks identified: blind right eye  Next appointment: 09/29/2019 at 2:00  Preventive Care 65 Years and Older, Male Preventive care refers to lifestyle choices and visits with your health care provider that can promote health and wellness. What does preventive care include?  A yearly physical exam. This is also called an annual well check.  Dental exams once or twice a year.  Routine eye exams. Ask your health care provider how often you should have your eyes checked.  Personal lifestyle choices, including:  Daily care of your teeth and gums.  Regular physical activity.  Eating a healthy diet.  Avoiding tobacco and drug use.  Limiting alcohol use.  Practicing safe sex.  Taking low doses of aspirin every day.  Taking vitamin and mineral supplements as recommended by your health care provider. What happens during an annual well check? The services and screenings done by your health care provider during your annual well check will depend on your age, overall health, lifestyle risk factors, and family history of disease. Counseling  Your health care provider may ask you questions about your:  Alcohol use.  Tobacco use.  Drug use.  Emotional well-being.  Home and relationship well-being.   Sexual activity.  Eating habits.  History of falls.  Memory and ability to understand (cognition).  Work and work Statistician. Screening  You may have the following tests or measurements:  Height, weight, and BMI.  Blood pressure.  Lipid and cholesterol levels. These may be checked every 5 years, or more frequently if you are over 26 years old.  Skin check.  Lung cancer screening. You may have this screening every year starting at age 72 if you have a 30-pack-year history of smoking and currently smoke or have quit within the past 15 years.  Fecal occult blood test (FOBT) of the stool. You may have this test every year starting at age 61.  Flexible sigmoidoscopy or colonoscopy. You may have a sigmoidoscopy every 5 years or a colonoscopy every 10 years starting at age 41.  Prostate cancer screening. Recommendations will vary depending on your family history and other risks.  Hepatitis C blood test.  Hepatitis B blood test.  Sexually transmitted disease (STD) testing.  Diabetes screening. This is done by checking your blood sugar (glucose) after you have not eaten for a while (fasting). You may have this done every 1-3 years.  Abdominal aortic aneurysm (AAA) screening. You may need this if you are a current or former smoker.  Osteoporosis. You may be screened starting at age 24 if you are at high risk. Talk with your health care provider about your test results, treatment options, and if necessary, the need for more tests. Vaccines  Your health care provider may recommend certain vaccines, such as:  Influenza vaccine. This is recommended every year.  Tetanus, diphtheria, and acellular pertussis (Tdap, Td) vaccine. You may need a Td  booster every 10 years.  Zoster vaccine. You may need this after age 45.  Pneumococcal 13-valent conjugate (PCV13) vaccine. One dose is recommended after age 53.  Pneumococcal polysaccharide (PPSV23) vaccine. One dose is recommended  after age 73. Talk to your health care provider about which screenings and vaccines you need and how often you need them. This information is not intended to replace advice given to you by your health care provider. Make sure you discuss any questions you have with your health care provider. Document Released: 06/02/2015 Document Revised: 01/24/2016 Document Reviewed: 03/07/2015 Elsevier Interactive Patient Education  2017 [place] Prevention in the Home Falls can cause injuries. They can happen to people of all ages. There are many things you can do to make your home safe and to help prevent falls. What can I do on the outside of my home?  Regularly fix the edges of walkways and driveways and fix any cracks.  Remove anything that might make you trip as you walk through a door, such as a raised step or threshold.  Trim any bushes or trees on the path to your home.  Use bright outdoor lighting.  Clear any walking paths of anything that might make someone trip, such as rocks or tools.  Regularly check to see if handrails are loose or broken. Make sure that both sides of any steps have handrails.  Any raised decks and porches should have guardrails on the edges.  Have any leaves, snow, or ice cleared regularly.  Use sand or salt on walking paths during winter.  Clean up any spills in your garage right away. This includes oil or grease spills. What can I do in the bathroom?  Use night lights.  Install grab bars by the toilet and in the tub and shower. Do not use towel bars as grab bars.  Use non-skid mats or decals in the tub or shower.  If you need to sit down in the shower, use a plastic, non-slip stool.  Keep the floor dry. Clean up any water that spills on the floor as soon as it happens.  Remove soap buildup in the tub or shower regularly.  Attach bath mats securely with double-sided non-slip rug tape.  Do not have throw rugs and other things on the floor  that can make you trip. What can I do in the bedroom?  Use night lights.  Make sure that you have a light by your bed that is easy to reach.  Do not use any sheets or blankets that are too big for your bed. They should not hang down onto the floor.  Have a firm chair that has side arms. You can use this for support while you get dressed.  Do not have throw rugs and other things on the floor that can make you trip. What can I do in the kitchen?  Clean up any spills right away.  Avoid walking on wet floors.  Keep items that you use a lot in easy-to-reach places.  If you need to reach something above you, use a strong step stool that has a grab bar.  Keep electrical cords out of the way.  Do not use floor polish or wax that makes floors slippery. If you must use wax, use non-skid floor wax.  Do not have throw rugs and other things on the floor that can make you trip. What can I do with my stairs?  Do not leave any items on the stairs.  Make sure that there are handrails on both sides of the stairs and use them. Fix handrails that are broken or loose. Make sure that handrails are as long as the stairways.  Check any carpeting to make sure that it is firmly attached to the stairs. Fix any carpet that is loose or worn.  Avoid having throw rugs at the top or bottom of the stairs. If you do have throw rugs, attach them to the floor with carpet tape.  Make sure that you have a light switch at the top of the stairs and the bottom of the stairs. If you do not have them, ask someone to add them for you. What else can I do to help prevent falls?  Wear shoes that:  Do not have high heels.  Have rubber bottoms.  Are comfortable and fit you well.  Are closed at the toe. Do not wear sandals.  If you use a stepladder:  Make sure that it is fully opened. Do not climb a closed stepladder.  Make sure that both sides of the stepladder are locked into place.  Ask someone to hold it  for you, if possible.  Clearly mark and make sure that you can see:  Any grab bars or handrails.  First and last steps.  Where the edge of each step is.  Use tools that help you move around (mobility aids) if they are needed. These include:  Canes.  Walkers.  Scooters.  Crutches.  Turn on the lights when you go into a dark area. Replace any light bulbs as soon as they burn out.  Set up your furniture so you have a clear path. Avoid moving your furniture around.  If any of your floors are uneven, fix them.  If there are any pets around you, be aware of where they are.  Review your medicines with your doctor. Some medicines can make you feel dizzy. This can increase your chance of falling. Ask your doctor what other things that you can do to help prevent falls. This information is not intended to replace advice given to you by your health care provider. Make sure you discuss any questions you have with your health care provider. Document Released: 03/02/2009 Document Revised: 10/12/2015 Document Reviewed: 06/10/2014 Elsevier Interactive Patient Education  2017 [employer].

## 2019-04-01 NOTE — Progress Notes (Signed)
Subjective:   Chad Miller is a 78 y.o. male who presents for Medicare Annual/Subsequent preventive examination.  Review of Systems:  n/a Cardiac Risk Factors include: advanced age (>72men, >65 women);hypertension;male gender     Objective:    Vitals: BP 130/76 (BP Location: Left Arm, Patient Position: Sitting, Cuff Size: Normal)   Pulse 71   Temp 97.6 F (36.4 C) (Oral)   Ht 5' 8.6" (1.742 m)   Wt 162 lb (73.5 kg)   BMI 24.20 kg/m   Body mass index is 24.2 kg/m.  Advanced Directives 04/01/2019  Does Patient Have a Medical Advance Directive? Yes  Type of Paramedic of [place];Living will  Copy of [place] in Chart? No - copy requested    Tobacco Social History   Tobacco Use  Smoking Status Never Smoker  Smokeless Tobacco Never Used     Counseling given: Not Answered   Clinical Intake:  Pre-visit preparation completed: Yes  Pain : 0-10 Pain Score: 5  Pain Type: Chronic pain Pain Location: Back Pain Orientation: Lower Pain Descriptors / Indicators: Aching Pain Onset: More than a month ago Pain Frequency: Constant Pain Relieving Factors: medication helps get through the night  Pain Relieving Factors: medication helps get through the night  Nutritional Status: BMI of 19-24  Normal Nutritional Risks: None Diabetes: No  How often do you need to have someone help you when you read instructions, pamphlets, or other written materials from your doctor or pharmacy?: 1 - Never What is the last grade level you completed in school?: master's degree  Interpreter Needed?: No  Information entered by :: NAllen LPN  Past Medical History:  Diagnosis Date  . High blood pressure   . High cholesterol   . Loss of vision    Right   Past Surgical History:  Procedure Laterality Date  . CATARACT EXTRACTION    . REPLACEMENT TOTAL KNEE Left 2004   Family History  Problem Relation Age of Onset  . Heart disease Mother    . Lung cancer Father   . Heart disease Brother   . Heart disease Brother   . Asthma Sister   . Allergic rhinitis Neg Hx   . Eczema Neg Hx   . Urticaria Neg Hx    Social History   Socioeconomic History  . Marital status: Married    Spouse name: Not on file  . Number of children: 2  . Years of education: College  . Highest education level: Not on file  Occupational History  . Occupation: Retired  Scientific laboratory technician  . Financial resource strain: Not hard at all  . Food insecurity    Worry: Never true    Inability: Never true  . Transportation needs    Medical: No    Non-medical: No  Tobacco Use  . Smoking status: Never Smoker  . Smokeless tobacco: Never Used  Substance and Sexual Activity  . Alcohol use: No    Comment: Quit 2015  . Drug use: No  . Sexual activity: Yes    Partners: Female    Comment: Married  Lifestyle  . Physical activity    Days per week: 4 days    Minutes per session: 150+ min  . Stress: Not at all  Relationships  . Social Herbalist on phone: Not on file    Gets together: Not on file    Attends religious service: Not on file    Active member of club or  organization: Not on file    Attends meetings of clubs or organizations: Not on file    Relationship status: Not on file  Other Topics Concern  . Not on file  Social History Narrative   Lives at home w/ his wife   Right-handed   Caffeine: 1 cup per day    Outpatient Encounter Medications as of 04/01/2019  Medication Sig  . aspirin EC 81 MG tablet Take 81 mg by mouth daily.  Marland Kitchen. atorvastatin (LIPITOR) 10 MG tablet TAKE 1 TABLET DAILY  . azelastine (ASTELIN) 0.1 % nasal spray Place 2 sprays into both nostrils 2 (two) times daily. Use in each nostril as directed  . co-enzyme Q-10 30 MG capsule Take 30 mg by mouth daily.  . cyclobenzaprine (FLEXERIL) 5 MG tablet Take 1 tablet (5 mg total) by mouth at bedtime. (Patient not taking: Reported on 04/01/2019)  . loratadine (CLARITIN) 10 MG  tablet Take 10 mg by mouth daily.  . mometasone (NASONEX) 50 MCG/ACT nasal spray Place 2 sprays into the nose daily.  . Multiple Vitamin (MULTIVITAMIN) tablet Take 1 tablet by mouth daily.  Marland Kitchen. olmesartan (BENICAR) 20 MG tablet Take 1 tablet (20 mg total) by mouth daily. (Patient not taking: Reported on 04/01/2019)  . Omega 3 1000 MG CAPS Take by mouth.  . ranitidine (ZANTAC) 150 MG tablet Take 150 mg by mouth 2 (two) times daily.  . tadalafil (CIALIS) 5 MG tablet Take 1 tablet (5 mg total) by mouth daily as needed for erectile dysfunction.  . [DISCONTINUED] pneumococcal 13-valent conjugate vaccine (PREVNAR 13) SUSP injection Inject 0.5 mLs into the muscle tomorrow at 10 am for 1 dose.   No facility-administered encounter medications on file as of 04/01/2019.     Activities of Daily Living In your present state of health, do you have any difficulty performing the following activities: 04/01/2019 04/01/2019  Hearing? N N  Vision? 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 N N  Walking or climbing stairs? N Y  Dressing or bathing? N N  Doing errands, shopping? N N  Preparing Food and eating ? N -  Using the Toilet? N -  In the past six months, have you accidently leaked urine? Y -  Comment due to prostate -  Do you have problems with loss of bowel control? N -  Managing your Medications? N -  Managing your Finances? N -  Housekeeping or managing your Housekeeping? N -  Some recent data might be hidden    Patient Care Team: Dorothyann PengSanders, Robyn, MD as PCP - General (Internal Medicine)   Assessment:   This is a routine wellness examination for Chad MillinOliver.  Exercise Activities and Dietary recommendations Current Exercise Habits: Home exercise routine(yard work), Time (Minutes): > 60, Frequency (Times/Week): 4, Weekly Exercise (Minutes/Week): 0  Goals    . Patient Stated     04/01/2019, wants to do more exercise with the equipment at home        Fall Risk Fall Risk  04/01/2019 04/01/2019 07/15/2018 12/06/2016  Falls in the past year? 0 0 0 Yes  Comment - - - Emmi Telephone Survey: data to providers prior to load  Number falls in past yr: 0 - - 2 or more  Comment - - - Emmi Telephone Survey Actual Response = 2  Injury with Fall? - - - No  Risk for fall due to : Impaired vision;Medication side effect - - -  Follow up  Falls evaluation completed;Education provided;Falls prevention discussed - - -   Is the patient's home free of loose throw rugs in walkways, pet beds, electrical cords, etc?   yes      Grab bars in the bathroom? yes      Handrails on the stairs?   yes      Adequate lighting?   yes  Timed Get Up and Go Performed: n/a  Depression Screen PHQ 2/9 Scores 04/01/2019 07/15/2018  PHQ - 2 Score 0 0  PHQ- 9 Score 3 -    Cognitive Function     6CIT Screen 04/01/2019  What Year? 0 points  What month? 0 points  What time? 3 points  Count back from 20 0 points  Months in reverse 0 points  Repeat phrase 2 points  Total Score 5    Immunization History  Administered Date(s) Administered  . Influenza-Unspecified 01/18/2013, 02/04/2019  . Pneumococcal Polysaccharide-23 04/30/2010, 03/18/2016  . Zoster Recombinat (Shingrix) 07/21/2017    Qualifies for Shingles Vaccine? yes  Screening Tests Health Maintenance  Topic Date Due  . TETANUS/TDAP  06/11/2021  . INFLUENZA VACCINE  Completed  . PNA vac Low Risk Adult  Completed   Cancer Screenings: Lung: Low Dose CT Chest recommended if Age 58-80 years, 30 pack-year currently smoking OR have quit w/in 15years. Patient does not qualify. Colorectal: up to date  Additional Screenings:  Hepatitis C Screening:n/a      Plan:    Patient wants to do more exercise with the equipment he has at home.  I have personally reviewed and noted the following in the patient's chart:   . Medical and social history . Use of alcohol, tobacco or illicit drugs  . Current medications  and supplements . Functional ability and status . Nutritional status . Physical activity . Advanced directives . List of other physicians . Hospitalizations, surgeries, and ER visits in previous 12 months . Vitals . Screenings to include cognitive, depression, and falls . Referrals and appointments  In addition, I have reviewed and discussed with patient certain preventive protocols, quality metrics, and best practice recommendations. A written personalized care plan for preventive services as well as general preventive health recommendations were provided to patient.     Barb Merino, LPN  [id number]

## 2019-04-02 LAB — CMP14+EGFR
ALT: 20 IU/L (ref 0–44)
AST: 24 IU/L (ref 0–40)
Albumin/Globulin Ratio: 1.3 (ref 1.2–2.2)
Albumin: 4.3 g/dL (ref 3.7–4.7)
Alkaline Phosphatase: 78 IU/L (ref 39–117)
BUN/Creatinine Ratio: 12 (ref 10–24)
BUN: 10 mg/dL (ref 8–27)
Bilirubin Total: 1.1 mg/dL (ref 0.0–1.2)
CO2: 25 mmol/L (ref 20–29)
Calcium: 9.4 mg/dL (ref 8.6–10.2)
Chloride: 103 mmol/L (ref 96–106)
Creatinine, Ser: 0.83 mg/dL (ref 0.76–1.27)
GFR calc Af Amer: 97 mL/min/{1.73_m2} (ref >59)
GFR calc non Af Amer: 84 mL/min/{1.73_m2} (ref >59)
Globulin, Total: 3.2 g/dL (ref 1.5–4.5)
Glucose: 93 mg/dL (ref 65–99)
Potassium: 4.5 mmol/L (ref 3.5–5.2)
Sodium: 141 mmol/L (ref 134–144)
Total Protein: 7.5 g/dL (ref 6.0–8.5)

## 2019-04-02 LAB — HEMOGLOBIN A1C
Est. average glucose Bld gHb Est-mCnc: 108 mg/dL
Hgb A1c MFr Bld: 5.4 % (ref 4.8–5.6)

## 2019-04-02 LAB — LIPID PANEL
Chol/HDL Ratio: 2.3 ratio (ref 0.0–5.0)
Cholesterol, Total: 126 mg/dL (ref 100–199)
HDL: 54 mg/dL (ref >39)
LDL Chol Calc (NIH): 62 mg/dL (ref 0–99)
Triglycerides: 42 mg/dL (ref 0–149)
VLDL Cholesterol Cal: 10 mg/dL (ref 5–40)

## 2019-04-02 LAB — CBC
Hematocrit: 42.4 % (ref 37.5–51.0)
Hemoglobin: 15 g/dL (ref 13.0–17.7)
MCH: 33.6 pg — ABNORMAL HIGH (ref 26.6–33.0)
MCHC: 35.4 g/dL (ref 31.5–35.7)
MCV: 95 fL (ref 79–97)
Platelets: 211 10*3/uL (ref 150–450)
RBC: 4.46 x10E6/uL (ref 4.14–5.80)
RDW: 13.2 % (ref 11.6–15.4)
WBC: 4.1 10*3/uL (ref 3.4–10.8)

## 2019-04-02 NOTE — Progress Notes (Signed)
Subjective:     Patient ID: Chad Miller , male    DOB: 20-Jan-1941 , 78 y.o.   MRN: [id number]   Chief Complaint  Patient presents with  . Hypertension  . Annual Exam    HPI  He is here today for a bp check. He thought he was getting a physical today. He has no specific concerns or complaints at this time.   Hypertension This is a chronic problem. The current episode started more than 1 year ago. The problem has been gradually improving since onset. The problem is controlled. Pertinent negatives include no blurred vision, chest pain or shortness of breath. Risk factors for coronary artery disease include dyslipidemia and male gender. Past treatments include angiotensin blockers. Hypertensive end-organ damage includes kidney disease.     Past Medical History:  Diagnosis Date  . High blood pressure   . High cholesterol   . Loss of vision    Right     Family History  Problem Relation Age of Onset  . Heart disease Mother   . Lung cancer Father   . Heart disease Brother   . Heart disease Brother   . Asthma Sister   . Allergic rhinitis Neg Hx   . Eczema Neg Hx   . Urticaria Neg Hx      Current Outpatient Medications:  .  aspirin EC 81 MG tablet, Take 81 mg by mouth daily., Disp: , Rfl:  .  atorvastatin (LIPITOR) 10 MG tablet, TAKE 1 TABLET DAILY, Disp: 90 tablet, Rfl: 2 .  azelastine (ASTELIN) 0.1 % nasal spray, Place 2 sprays into both nostrils 2 (two) times daily. Use in each nostril as directed, Disp: 30 mL, Rfl: 5 .  Omega 3 1000 MG CAPS, Take by mouth., Disp: , Rfl:  .  tadalafil (CIALIS) 5 MG tablet, Take 1 tablet (5 mg total) by mouth daily as needed for erectile dysfunction., Disp: 10 tablet, Rfl: 0 .  co-enzyme Q-10 30 MG capsule, Take 30 mg by mouth daily., Disp: , Rfl:  .  cyclobenzaprine (FLEXERIL) 5 MG tablet, Take 1 tablet (5 mg total) by mouth at bedtime. (Patient not taking: Reported on 04/01/2019), Disp: 30 tablet, Rfl: 11 .  loratadine (CLARITIN) 10 MG  tablet, Take 10 mg by mouth daily., Disp: , Rfl:  .  mometasone (NASONEX) 50 MCG/ACT nasal spray, Place 2 sprays into the nose daily., Disp: , Rfl:  .  Multiple Vitamin (MULTIVITAMIN) tablet, Take 1 tablet by mouth daily., Disp: , Rfl:  .  olmesartan (BENICAR) 20 MG tablet, Take 1 tablet (20 mg total) by mouth daily. (Patient not taking: Reported on 04/01/2019), Disp: 90 tablet, Rfl: 1 .  ranitidine (ZANTAC) 150 MG tablet, Take 150 mg by mouth 2 (two) times daily., Disp: , Rfl:    No Known Allergies   Men's preventive visit. Patient Health Questionnaire (PHQ-2) is    Clinical Support from 04/01/2019 in [facility]  PHQ-2 Total Score  0    . Patient is on a healthy diet. Marital status: Married. Relevant history for alcohol use is:  Social History   Substance and Sexual Activity  Alcohol Use No   Comment: Quit 2015  . Relevant history for tobacco use is:  Social History   Tobacco Use  Smoking Status Never Smoker  Smokeless Tobacco Never Used  .  Review of Systems  Constitutional: Negative.   HENT: Negative.   Eyes: Negative.  Negative for blurred vision.  Respiratory: Negative.  Negative for  shortness of breath.   Cardiovascular: Negative.  Negative for chest pain.  Endocrine: Negative.   Genitourinary: Negative.   Musculoskeletal: Negative.   Skin: Negative.   Allergic/Immunologic: Negative.   Neurological: Negative.   Hematological: Negative.   Psychiatric/Behavioral: Negative.      Today's Vitals   04/01/19 1406  BP: 130/76  Pulse: 71  Temp: 97.6 F (36.4 C)  TempSrc: Oral  Weight: 162 lb 12.8 oz (73.8 kg)  Height: 5' 8.6" (1.742 m)  PainSc: 5   PainLoc: Back   Body mass index is 24.32 kg/m.   Objective:  Physical Exam Vitals signs and nursing note reviewed.  Constitutional:      Appearance: Normal appearance.  HENT:     Head: Normocephalic and atraumatic.     Right Ear: Tympanic membrane, ear canal and external ear normal.      Left Ear: Tympanic membrane, ear canal and external ear normal.     Nose: Nose normal.     Mouth/Throat:     Mouth: Mucous membranes are moist.     Pharynx: Oropharynx is clear.  Eyes:     Extraocular Movements: Extraocular movements intact.     Conjunctiva/sclera: Conjunctivae normal.     Pupils: Pupils are equal, round, and reactive to light.  Neck:     Musculoskeletal: Normal range of motion and neck supple.  Cardiovascular:     Rate and Rhythm: Normal rate and regular rhythm.     Pulses: Normal pulses.     Heart sounds: Normal heart sounds.  Pulmonary:     Effort: Pulmonary effort is normal.     Breath sounds: Normal breath sounds.  Chest:     Breasts:        Right: Normal. No swelling, bleeding, inverted nipple, mass or nipple discharge.        Left: Normal. No swelling, bleeding, inverted nipple, mass or nipple discharge.  Abdominal:     General: Abdomen is flat. Bowel sounds are normal.     Palpations: Abdomen is soft.  Genitourinary:    Prostate: Normal.     Rectum: Normal. Guaiac result negative.  Musculoskeletal: Normal range of motion.  Skin:    General: Skin is warm.  Neurological:     General: No focal deficit present.     Mental Status: He is alert.  Psychiatric:        Mood and Affect: Mood normal.        Behavior: Behavior normal.         Assessment And Plan:     1. Hypertensive nephropathy  Chronic, controlled. He will continue with current medications. EKG performed - NSR w/ RBBB - no new changes noted. He is encouraged to avoid adding salt to his foods and to increase his daily activity. He will rto in six months for re-evaluation.   - EKG 12-Lead - CMP14+EGFR - CBC - POCT Urinalysis Dipstick (81002) - POCT UA - Microalbumin  2. Chronic renal disease, stage II  Chronic, yet stable.  He is encouraged to stay well hydrated and keep bp well controlled. I will check GFR, Cr today.   3. Pure hypercholesterolemia  Chronic, yet stable. He has  been well on atorvastatin daily. He denies having any side effects from the medication.   - Lipid panel  4. Other abnormal glucose  HIS A1C HAS BEEN ELEVATED IN THE PAST. I WILL CHECK AN A1C, BMET TODAY. HE WAS ENCOURAGED TO AVOID SUGARY BEVERAGES AND PROCESSED FOODS INCLUDNG BREADS, RICE AND  PASTA.  - Hemoglobin A1c  5. Routine general medical examination at health care facility  A full exam was performed.  DRE deferred as per patient request. He is also followed by Urology. PATIENT HAS BEEN ADVISED TO GET 30-45 MINUTES REGULAR EXERCISE NO LESS THAN FOUR TO FIVE DAYS PER WEEK - BOTH WEIGHTBEARING EXERCISES AND AEROBIC ARE RECOMMENDED.  HE WAS ADVISED TO FOLLOW A HEALTHY DIET WITH AT LEAST SIX FRUITS/VEGGIES PER DAY, DECREASE INTAKE OF RED MEAT, AND TO INCREASE FISH INTAKE TO TWO DAYS PER WEEK.  MEATS/FISH SHOULD NOT BE FRIED, BAKED OR BROILED IS PREFERABLE.  I SUGGEST WEARING SPF 50 SUNSCREEN ON EXPOSED PARTS AND ESPECIALLY WHEN IN THE DIRECT SUNLIGHT FOR AN EXTENDED PERIOD OF TIME.  PLEASE AVOID FAST FOOD RESTAURANTS AND INCREASE YOUR WATER INTAKE.  [name], MD    THE PATIENT IS ENCOURAGED TO PRACTICE SOCIAL DISTANCING DUE TO THE COVID-19 PANDEMIC.

## 2019-04-05 ENCOUNTER — Other Ambulatory Visit: Payer: Self-pay

## 2019-04-05 ENCOUNTER — Ambulatory Visit: Payer: Self-pay

## 2019-04-05 ENCOUNTER — Ambulatory Visit (INDEPENDENT_AMBULATORY_CARE_PROVIDER_SITE_OTHER): Payer: Medicare Other | Admitting: Physical Medicine and Rehabilitation

## 2019-04-05 ENCOUNTER — Encounter: Payer: Self-pay | Admitting: Physical Medicine and Rehabilitation

## 2019-04-05 VITALS — BP 161/85 | HR 42

## 2019-04-05 DIAGNOSIS — M48062 Spinal stenosis, lumbar region with neurogenic claudication: Secondary | ICD-10-CM | POA: Diagnosis not present

## 2019-04-05 MED ORDER — METHYLPREDNISOLONE ACETATE 80 MG/ML IJ SUSP
80.0000 mg | Freq: Once | INTRAMUSCULAR | Status: AC
  Administered 2019-04-05: 80 mg

## 2019-04-05 MED ORDER — ATORVASTATIN CALCIUM 10 MG PO TABS: 10.0000 mg | ORAL_TABLET | Freq: Every day | ORAL | 2 refills | Status: DC

## 2019-04-05 MED ORDER — OLMESARTAN MEDOXOMIL 20 MG PO TABS: 20.0000 mg | ORAL_TABLET | Freq: Every day | ORAL | 2 refills | Status: DC

## 2019-04-05 NOTE — Progress Notes (Signed)
Pt states pain in the lower back mostly on the right side. Pt states pain started years ago and started to get worse in the past few months. Pt states lifting makes pain worse, heating pad helps with pain.   .Numeric Pain Rating Scale and Functional Assessment Average Pain 5   In the last MONTH (on 0-10 scale) has pain interfered with the following?  1. General activity like being  able to carry out your everyday physical activities such as walking, climbing stairs, carrying groceries, or moving a chair?  Rating(6)   +Driver, -BT, -Dye Allergies.

## 2019-04-26 ENCOUNTER — Other Ambulatory Visit: Payer: Self-pay

## 2019-04-26 ENCOUNTER — Ambulatory Visit (INDEPENDENT_AMBULATORY_CARE_PROVIDER_SITE_OTHER): Payer: Medicare Other | Admitting: Physical Medicine and Rehabilitation

## 2019-04-26 ENCOUNTER — Encounter: Payer: Self-pay | Admitting: Physical Medicine and Rehabilitation

## 2019-04-26 ENCOUNTER — Ambulatory Visit: Payer: Self-pay

## 2019-04-26 VITALS — BP 133/68 | HR 45

## 2019-04-26 DIAGNOSIS — M48062 Spinal stenosis, lumbar region with neurogenic claudication: Secondary | ICD-10-CM | POA: Diagnosis not present

## 2019-04-26 DIAGNOSIS — M5416 Radiculopathy, lumbar region: Secondary | ICD-10-CM

## 2019-04-26 MED ORDER — BETAMETHASONE SOD PHOS & ACET 6 (3-3) MG/ML IJ SUSP
12.0000 mg | Freq: Once | INTRAMUSCULAR | Status: AC
  Administered 2019-04-26: 12 mg

## 2019-04-26 NOTE — Progress Notes (Signed)
 .  Numeric Pain Rating Scale and Functional Assessment Average Pain 6   In the last MONTH (on 0-10 scale) has pain interfered with the following?  1. General activity like being  able to carry out your everyday physical activities such as walking, climbing stairs, carrying groceries, or moving a chair?  Rating(6)   +Driver, -BT, -Dye Allergies.  

## 2019-04-27 NOTE — Progress Notes (Signed)
Chad Miller - 78 y.o. male MRN [id number]  Date of birth: January 07, 1941  Office Visit Note: Visit Date: 04/26/2019 PCP: [name], MD Referred by: [name], MD  Subjective: Chief Complaint  Patient presents with  . Lower Back - Pain   HPI:  Chad Miller is a 78 y.o. male who comes in today For planned L4 transforaminal epidural steroid injection.  Patient originally sent to [place] by Dr. Anderson Malta who follows him from an orthopedic standpoint.  We have completed general interlaminar epidural steroid injection at L5-S1 with what the patient described at first is not very much relief at all.  But with talking to him the very sharp spasm type pain he was having at night has actually gone away.  He continues to have low back pain with pain down the right hip and leg.  No paresthesias.  Is been ongoing for many years.  Please see our prior note for further details.  Patient has multilevel severe stenosis unfortunately.  He is asking today about whether the medicine he takes for prostate which is tadalafil could be causing his back pain.  I told him I have never run into this that he should talk to his urologist to see what their experience with that is.  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  We also discussed the use of inversion tables etc.  I think the patient needs transforaminal injection at the level of stenosis since we did not try that first and I hope that will give him enough relief.  If he gets relief of his hip and thigh pain we might look at diagnostic medial branch blocks to see if that helps his back pain more.  This is been ongoing many years she has had multiple bouts of physical therapy without relief.  ROS Otherwise per HPI.  Assessment & Plan: Visit Diagnoses:  1. Spinal stenosis of lumbar region with neurogenic claudication   2. Lumbar radiculopathy     Plan: No additional findings.   Meds &  Orders:  Meds ordered this encounter  Medications  . betamethasone acetate-betamethasone sodium phosphate (CELESTONE) injection 12 mg    Orders Placed This Encounter  Procedures  . XR C-ARM NO REPORT  . Epidural Steroid injection    Follow-up: Return if symptoms worsen or fail to improve.   Procedures: No procedures performed  Lumbosacral Transforaminal Epidural Steroid Injection - Sub-Pedicular Approach with Fluoroscopic Guidance  Patient: Chad Miller      Date of Birth: Dec 03, 1940 MRN: [id number] PCP: [name], MD      Visit Date: 04/26/2019   Universal Protocol:    Date/Time: 04/26/2019  Consent Given By: the patient  Position: PRONE  Additional Comments: Vital signs were monitored before and after the procedure. Patient was prepped and draped in the usual sterile fashion. The correct patient, procedure, and site was verified.   Injection Procedure Details:  Procedure Site One Meds Administered:  Meds ordered this encounter  Medications  . betamethasone acetate-betamethasone sodium phosphate (CELESTONE) injection 12 mg    Laterality: Right  Location/Site:  L4-L5  Needle size: 22 G  Needle type: Spinal  Needle Placement: Transforaminal  Findings:    -Comments: Excellent flow of contrast along the nerve and into the epidural space.  Procedure Details: After squaring off the end-plates to get a true AP view, the C-arm was positioned so that an oblique view of the foramen  as noted above was visualized. The target area is just inferior to the "nose of the scotty dog" or sub pedicular. The soft tissues overlying this structure were infiltrated with 2-3 ml. of 1% Lidocaine without Epinephrine.  The spinal needle was inserted toward the target using a "trajectory" view along the fluoroscope beam.  Under AP and lateral visualization, the needle was advanced so it did not puncture dura and was located close the 6 O'Clock position of the pedical in AP  tracterory. Biplanar projections were used to confirm position. Aspiration was confirmed to be negative for CSF and/or blood. A 1-2 ml. volume of Isovue-250 was injected and flow of contrast was noted at each level. Radiographs were obtained for documentation purposes.   After attaining the desired flow of contrast documented above, a 0.5 to 1.0 ml test dose of 0.25% Marcaine was injected into each respective transforaminal space.  The patient was observed for 90 seconds post injection.  After no sensory deficits were reported, and normal lower extremity motor function was noted,   the above injectate was administered so that equal amounts of the injectate were placed at each foramen (level) into the transforaminal epidural space.   Additional Comments:  The patient tolerated the procedure well Dressing: 2 x 2 sterile gauze and Band-Aid    Post-procedure details: Patient was observed during the procedure. Post-procedure instructions were reviewed.  Patient left the clinic in stable condition.    Clinical History: MRI LUMBAR SPINE WITHOUT CONTRAST  TECHNIQUE: Multiplanar, multisequence MR imaging of the lumbar spine was performed. No intravenous contrast was administered.  COMPARISON:  Lumbar MRI 07/13/2016. CT Abdomen and Pelvis 01/17/2012.  FINDINGS: Segmentation: Normal, vestigial L5-S1 disc space. This is the same numbering system used on the 07/13/2016 MRI.  Alignment: Chronic straightening and mild reversal of lumbar lordosis with mostly levoconvex lumbar scoliosis. Chronic retrolisthesis of L1 on L2 measures up to 6 millimeters but appears stable since 2018.  Vertebrae: Chronic severe endplate degeneration with associated marrow signal changes L2-L3 through L5-S1. Background bone marrow signal remains normal. No acute or suspicious marrow lesion identified. Intact visible sacrum and SI joints.  Conus medullaris and cauda equina: Conus extends to the L1 level. No  lower spinal cord or conus signal abnormality.  Paraspinal and other soft tissues: Negative.  Disc levels:  T11-T12: Borderline to mild spinal stenosis related to disc bulging and moderate posterior element hypertrophy. Superimposed severe left T11 foraminal stenosis which may be in part related to a left foraminal disc protrusion. Moderate to severe right T11 foraminal stenosis. These are stable since 2018.  T12-L1: Left eccentric disc bulge and posterior element hypertrophy. No significant spinal stenosis. Mild to moderate T12 foraminal stenosis. This level is stable.  L1-L2: Chronic retrolisthesis and circumferential disc bulge eccentric to the left. Moderate posterior element hypertrophy with new trace degenerative facet joint fluid. Mild spinal stenosis and mild-to-moderate right greater than left L1 foraminal stenosis. This level is stable.  L2-L3: Severe chronic disc space loss with bulky disc osteophyte complex and moderate posterior element hypertrophy. Moderate to severe spinal and right lateral recess stenosis. Severe right L2 foraminal stenosis. This level is stable.  L3-L4: Severe chronic disc space loss with bulky circumferential disc osteophyte complex and posterior element hypertrophy. Epidural fat contributes to moderate to severe spinal stenosis with moderate left and severe right L3 foraminal stenosis. This level is stable.  L4-L5: Severe chronic disc space loss with bulky circumferential disc osteophyte complex and posterior element hypertrophy. Severe spinal stenosis  and left greater than right lateral recess stenosis. Severe left and mild right L4 foraminal stenosis. This level is stable.  L5-S1: Severe chronic disc space loss with bulky circumferential disc osteophyte complex and posterior element hypertrophy. Moderate to severe spinal and bilateral lateral recess stenosis. Moderate to severe L5 foraminal stenosis perhaps greater on the right.  This level is stable.  S1-S2: Vestigial disc, otherwise negative.  IMPRESSION: 1. No acute findings in the lumbar spine. Degenerative marrow signal changes with no suspicious osseous lesion identified. 2. 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 3. 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 4. Stable mild lower thoracic spinal stenosis and moderate to severe foraminal stenosis at T11-T12.   Electronically Signed   By: Odessa FlemingH  Hall M.D.   On: 02/28/2019 22:04     Objective:  VS:  HT:    WT:   BMI:     BP:133/68  HR:(!) 45bpm  TEMP: ( )  RESP:  Physical Exam  Ortho Exam Imaging: Xr C-arm No Report  Result Date: 04/26/2019 Please see Notes tab for imaging impression.

## 2019-04-27 NOTE — Procedures (Signed)
Lumbosacral Transforaminal Epidural Steroid Injection - Sub-Pedicular Approach with Fluoroscopic Guidance  Patient: Chad Miller      Date of Birth: [birth year] MRN: [id number] PCP: [name], MD      Visit Date: 04/26/2019   Universal Protocol:    Date/Time: 04/26/2019  Consent Given By: the patient  Position: PRONE  Additional Comments: Vital signs were monitored before and after the procedure. Patient was prepped and draped in the usual sterile fashion. The correct patient, procedure, and site was verified.   Injection Procedure Details:  Procedure Site One Meds Administered:  Meds ordered this encounter  Medications  . betamethasone acetate-betamethasone sodium phosphate (CELESTONE) injection 12 mg    Laterality: Right  Location/Site:  L4-L5  Needle size: 22 G  Needle type: Spinal  Needle Placement: Transforaminal  Findings:    -Comments: Excellent flow of contrast along the nerve and into the epidural space.  Procedure Details: After squaring off the end-plates to get a true AP view, the C-arm was positioned so that an oblique view of the foramen as noted above was visualized. The target area is just inferior to the "nose of the scotty dog" or sub pedicular. The soft tissues overlying this structure were infiltrated with 2-3 ml. of 1% Lidocaine without Epinephrine.  The spinal needle was inserted toward the target using a "trajectory" view along the fluoroscope beam.  Under AP and lateral visualization, the needle was advanced so it did not puncture dura and was located close the 6 O'Clock position of the pedical in AP tracterory. Biplanar projections were used to confirm position. Aspiration was confirmed to be negative for CSF and/or blood. A 1-2 ml. volume of Isovue-250 was injected and flow of contrast was noted at each level. Radiographs were obtained for documentation purposes.   After attaining the desired flow of contrast documented above, a 0.5 to  1.0 ml test dose of 0.25% Marcaine was injected into each respective transforaminal space.  The patient was observed for 90 seconds post injection.  After no sensory deficits were reported, and normal lower extremity motor function was noted,   the above injectate was administered so that equal amounts of the injectate were placed at each foramen (level) into the transforaminal epidural space.   Additional Comments:  The patient tolerated the procedure well Dressing: 2 x 2 sterile gauze and Band-Aid    Post-procedure details: Patient was observed during the procedure. Post-procedure instructions were reviewed.  Patient left the clinic in stable condition.

## 2019-05-24 ENCOUNTER — Encounter: Payer: Self-pay | Admitting: Internal Medicine

## 2019-06-14 DIAGNOSIS — R351 Nocturia: Secondary | ICD-10-CM | POA: Diagnosis not present

## 2019-06-14 DIAGNOSIS — N401 Enlarged prostate with lower urinary tract symptoms: Secondary | ICD-10-CM | POA: Diagnosis not present

## 2019-07-19 DIAGNOSIS — N401 Enlarged prostate with lower urinary tract symptoms: Secondary | ICD-10-CM | POA: Diagnosis not present

## 2019-07-19 DIAGNOSIS — R3915 Urgency of urination: Secondary | ICD-10-CM | POA: Diagnosis not present

## 2019-08-11 NOTE — Progress Notes (Signed)
Chad Miller - 79 y.o. male MRN [id number]  Date of birth: 1940/12/29  Office Visit Note: Visit Date: 04/05/2019 PCP: [name], MD Referred by: [name], MD  Subjective: Chief Complaint  Patient presents with  . Lower Back - Pain   HPI:  [name] is a 79 y.o. male who comes in today For planned right L5-S1 interlaminar steroid injection at the request of Dr. Burnard Bunting.  Patient with multilevel severe stenosis.  He has having right radicular leg pain without any resolution with medication management  ROS Otherwise per HPI.  Assessment & Plan: Visit Diagnoses:  1. Spinal stenosis of lumbar region with neurogenic claudication     Plan: No additional findings.   Meds & Orders:  Meds ordered this encounter  Medications  . methylPREDNISolone acetate (DEPO-MEDROL) injection 80 mg    Orders Placed This Encounter  Procedures  . XR C-ARM NO REPORT  . Epidural Steroid injection    Follow-up: No follow-ups on file.   Procedures: No procedures performed  Lumbar Epidural Steroid Injection - Interlaminar Approach with Fluoroscopic Guidance  Patient: Chad Miller      Date of Birth: 1940/11/14 MRN: [id number] PCP: [name], MD      Visit Date: 04/05/2019   Universal Protocol:     Consent Given By: the patient  Position: PRONE  Additional Comments: Vital signs were monitored before and after the procedure. Patient was prepped and draped in the usual sterile fashion. The correct patient, procedure, and site was verified.   Injection Procedure Details:  Procedure Site One Meds Administered:  Meds ordered this encounter  Medications  . methylPREDNISolone acetate (DEPO-MEDROL) injection 80 mg     Laterality: Right  Location/Site:  L5-S1  Needle size: 20 G  Needle type: Tuohy  Needle Placement: Paramedian epidural  Findings:   -Comments: Excellent flow of contrast into the epidural space.  Procedure Details: Using a paramedian  approach from the side mentioned above, the region overlying the inferior lamina was localized under fluoroscopic visualization and the soft tissues overlying this structure were infiltrated with 4 ml. of 1% Lidocaine without Epinephrine. The Tuohy needle was inserted into the epidural space using a paramedian approach.   The epidural space was localized using loss of resistance along with lateral and bi-planar fluoroscopic views.  After negative aspirate for air, blood, and CSF, a 2 ml. volume of Isovue-250 was injected into the epidural space and the flow of contrast was observed. Radiographs were obtained for documentation purposes.    The injectate was administered into the level noted above.   Additional Comments:  The patient tolerated the procedure well Dressing: 2 x 2 sterile gauze and Band-Aid    Post-procedure details: Patient was observed during the procedure. Post-procedure instructions were reviewed.  Patient left the clinic in stable condition.    Clinical History: MRI LUMBAR SPINE WITHOUT CONTRAST  TECHNIQUE: Multiplanar, multisequence MR imaging of the lumbar spine was performed. No intravenous contrast was administered.  COMPARISON:  Lumbar MRI 07/13/2016. CT Abdomen and Pelvis 01/17/2012.  FINDINGS: Segmentation: Normal, vestigial L5-S1 disc space. This is the same numbering system used on the 07/13/2016 MRI.  Alignment: Chronic straightening and mild reversal of lumbar lordosis with mostly levoconvex lumbar scoliosis. Chronic retrolisthesis of L1 on L2 measures up to 6 millimeters but appears stable since 2018.  Vertebrae: Chronic severe endplate degeneration with associated marrow signal changes L2-L3 through L5-S1. Background bone marrow signal remains normal. No acute or suspicious marrow lesion identified. Intact  visible sacrum and SI joints.  Conus medullaris and cauda equina: Conus extends to the L1 level. No lower spinal cord or conus signal  abnormality.  Paraspinal and other soft tissues: Negative.  Disc levels:  T11-T12: Borderline to mild spinal stenosis related to disc bulging and moderate posterior element hypertrophy. Superimposed severe left T11 foraminal stenosis which may be in part related to a left foraminal disc protrusion. Moderate to severe right T11 foraminal stenosis. These are stable since 2018.  T12-L1: Left eccentric disc bulge and posterior element hypertrophy. No significant spinal stenosis. Mild to moderate T12 foraminal stenosis. This level is stable.  L1-L2: Chronic retrolisthesis and circumferential disc bulge eccentric to the left. Moderate posterior element hypertrophy with new trace degenerative facet joint fluid. Mild spinal stenosis and mild-to-moderate right greater than left L1 foraminal stenosis. This level is stable.  L2-L3: Severe chronic disc space loss with bulky disc osteophyte complex and moderate posterior element hypertrophy. Moderate to severe spinal and right lateral recess stenosis. Severe right L2 foraminal stenosis. This level is stable.  L3-L4: Severe chronic disc space loss with bulky circumferential disc osteophyte complex and posterior element hypertrophy. Epidural fat contributes to moderate to severe spinal stenosis with moderate left and severe right L3 foraminal stenosis. This level is stable.  L4-L5: Severe chronic disc space loss with bulky circumferential disc osteophyte complex and posterior element hypertrophy. Severe spinal stenosis and left greater than right lateral recess stenosis. Severe left and mild right L4 foraminal stenosis. This level is stable.  L5-S1: Severe chronic disc space loss with bulky circumferential disc osteophyte complex and posterior element hypertrophy. Moderate to severe spinal and bilateral lateral recess stenosis. Moderate to severe L5 foraminal stenosis perhaps greater on the right. This level is stable.  S1-S2:  Vestigial disc, otherwise negative.  IMPRESSION: 1. No acute findings in the lumbar spine. Degenerative marrow signal changes with no suspicious osseous lesion identified. 2. 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 3. 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 4. Stable mild lower thoracic spinal stenosis and moderate to severe foraminal stenosis at T11-T12.   Electronically Signed   By: Genevie Ann M.D.   On: 02/28/2019 22:04     Objective:  VS:  HT:    WT:   BMI:     BP:(!) 161/85  HR:(!) 42bpm  TEMP: ( )  RESP:  Physical Exam  Ortho Exam Imaging: No results found.

## 2019-08-11 NOTE — Procedures (Signed)
Lumbar Epidural Steroid Injection - Interlaminar Approach with Fluoroscopic Guidance  Patient: Chad Miller      Date of Birth: August 15, 1940 MRN: [id number] PCP: [name], MD      Visit Date: 04/05/2019   Universal Protocol:     Consent Given By: the patient  Position: PRONE  Additional Comments: Vital signs were monitored before and after the procedure. Patient was prepped and draped in the usual sterile fashion. The correct patient, procedure, and site was verified.   Injection Procedure Details:  Procedure Site One Meds Administered:  Meds ordered this encounter  Medications  . methylPREDNISolone acetate (DEPO-MEDROL) injection 80 mg     Laterality: Right  Location/Site:  L5-S1  Needle size: 20 G  Needle type: Tuohy  Needle Placement: Paramedian epidural  Findings:   -Comments: Excellent flow of contrast into the epidural space.  Procedure Details: Using a paramedian approach from the side mentioned above, the region overlying the inferior lamina was localized under fluoroscopic visualization and the soft tissues overlying this structure were infiltrated with 4 ml. of 1% Lidocaine without Epinephrine. The Tuohy needle was inserted into the epidural space using a paramedian approach.   The epidural space was localized using loss of resistance along with lateral and bi-planar fluoroscopic views.  After negative aspirate for air, blood, and CSF, a 2 ml. volume of Isovue-250 was injected into the epidural space and the flow of contrast was observed. Radiographs were obtained for documentation purposes.    The injectate was administered into the level noted above.   Additional Comments:  The patient tolerated the procedure well Dressing: 2 x 2 sterile gauze and Band-Aid    Post-procedure details: Patient was observed during the procedure. Post-procedure instructions were reviewed.  Patient left the clinic in stable condition.

## 2019-09-29 ENCOUNTER — Encounter: Payer: Self-pay | Admitting: Internal Medicine

## 2019-09-29 ENCOUNTER — Other Ambulatory Visit: Payer: Self-pay

## 2019-09-29 ENCOUNTER — Ambulatory Visit (INDEPENDENT_AMBULATORY_CARE_PROVIDER_SITE_OTHER): Payer: Medicare Other | Admitting: Internal Medicine

## 2019-09-29 VITALS — BP 116/74 | HR 76 | Temp 97.5°F | Ht 68.6 in | Wt 155.0 lb

## 2019-09-29 DIAGNOSIS — N182 Chronic kidney disease, stage 2 (mild): Secondary | ICD-10-CM | POA: Diagnosis not present

## 2019-09-29 DIAGNOSIS — M791 Myalgia, unspecified site: Secondary | ICD-10-CM

## 2019-09-29 DIAGNOSIS — M79642 Pain in left hand: Secondary | ICD-10-CM | POA: Diagnosis not present

## 2019-09-29 DIAGNOSIS — E7800 Pure hypercholesterolemia, unspecified: Secondary | ICD-10-CM

## 2019-09-29 DIAGNOSIS — J301 Allergic rhinitis due to pollen: Secondary | ICD-10-CM

## 2019-09-29 DIAGNOSIS — M48062 Spinal stenosis, lumbar region with neurogenic claudication: Secondary | ICD-10-CM

## 2019-09-29 DIAGNOSIS — M79641 Pain in right hand: Secondary | ICD-10-CM | POA: Diagnosis not present

## 2019-09-29 DIAGNOSIS — R1310 Dysphagia, unspecified: Secondary | ICD-10-CM | POA: Diagnosis not present

## 2019-09-29 DIAGNOSIS — I129 Hypertensive chronic kidney disease with stage 1 through stage 4 chronic kidney disease, or unspecified chronic kidney disease: Secondary | ICD-10-CM | POA: Diagnosis not present

## 2019-09-29 DIAGNOSIS — G894 Chronic pain syndrome: Secondary | ICD-10-CM | POA: Diagnosis not present

## 2019-09-29 MED ORDER — DULOXETINE HCL 20 MG PO CPEP
20.0000 mg | ORAL_CAPSULE | Freq: Every day | ORAL | 2 refills | Status: DC
Start: 2019-09-29 — End: 2020-04-12

## 2019-09-29 NOTE — Patient Instructions (Addendum)
Take duloxetine with evening meal  Duloxetine delayed-release capsules What is this medicine? DULOXETINE (doo LOX e teen) is used to treat depression, anxiety, and different types of chronic pain. This medicine may be used for other purposes; ask your health care provider or pharmacist if you have questions. COMMON BRAND NAME(S): Cymbalta, Murrell Converse, Irenka What should I tell my health care provider before I take this medicine? They need to know if you have any of these conditions:  bipolar disorder  glaucoma  high blood pressure  kidney disease  liver disease  seizures  suicidal thoughts, plans or attempt; a previous suicide attempt by you or a family member  take medicines that treat or prevent blood clots  taken medicines called MAOIs like Carbex, Eldepryl, Marplan, Nardil, and Parnate within 14 days  trouble passing urine  an unusual reaction to duloxetine, other medicines, foods, dyes, or preservatives  pregnant or trying to get pregnant  breast-feeding How should I use this medicine? Take this medicine by mouth with a glass of water. Follow the directions on the prescription label. Do not crush, cut or chew some capsules of this medicine. Some capsules may be opened and sprinkled on applesauce. Check with your doctor or pharmacist if you are not sure. You can take this medicine with or without food. Take your medicine at regular intervals. Do not take your medicine more often than directed. Do not stop taking this medicine suddenly except upon the advice of your doctor. Stopping this medicine too quickly may cause serious side effects or your condition may worsen. A special MedGuide will be given to you by the pharmacist with each prescription and refill. Be sure to read this information carefully each time. Talk to your pediatrician regarding the use of this medicine in children. While this drug may be prescribed for children as young as 53 years of age for selected  conditions, precautions do apply. Overdosage: If you think you have taken too much of this medicine contact a poison control center or emergency room at once. NOTE: This medicine is only for you. Do not share this medicine with others. What if I miss a dose? If you miss a dose, take it as soon as you can. If it is almost time for your next dose, take only that dose. Do not take double or extra doses. What may interact with this medicine? Do not take this medicine with any of the following medications:  desvenlafaxine  levomilnacipran  linezolid  MAOIs like Carbex, Eldepryl, Marplan, Nardil, and Parnate  methylene blue (injected into a vein)  milnacipran  thioridazine  venlafaxine This medicine may also interact with the following medications:  alcohol  amphetamines  aspirin and aspirin-like medicines  certain antibiotics like ciprofloxacin and enoxacin  certain medicines for blood pressure, heart disease, irregular heart beat  certain medicines for depression, anxiety, or psychotic disturbances  certain medicines for migraine headache like almotriptan, eletriptan, frovatriptan, naratriptan, rizatriptan, sumatriptan, zolmitriptan  certain medicines that treat or prevent blood clots like warfarin, enoxaparin, and dalteparin  cimetidine  fentanyl  lithium  NSAIDS, medicines for pain and inflammation, like ibuprofen or naproxen  phentermine  procarbazine  rasagiline  sibutramine  St. John's wort  theophylline  tramadol  tryptophan This list may not describe all possible interactions. Give your health care provider a list of all the medicines, herbs, non-prescription drugs, or dietary supplements you use. Also tell them if you smoke, drink alcohol, or use illegal drugs. Some items may interact with your medicine.  What should I watch for while using this medicine? Tell your doctor if your symptoms do not get better or if they get worse. Visit your doctor or  healthcare provider for regular checks on your progress. Because it may take several weeks to see the full effects of this medicine, it is important to continue your treatment as prescribed by your doctor. This medicine may cause serious skin reactions. They can happen weeks to months after starting the medicine. Contact your healthcare provider right away if you notice fevers or flu-like symptoms with a rash. The rash may be red or purple and then turn into blisters or peeling of the skin. Or, you might notice a red rash with swelling of the face, lips, or lymph nodes in your neck or under your arms. Patients and their families should watch out for new or worsening thoughts of suicide or depression. Also watch out for sudden changes in feelings such as feeling anxious, agitated, panicky, irritable, hostile, aggressive, impulsive, severely restless, overly excited and hyperactive, or not being able to sleep. If this happens, especially at the beginning of treatment or after a change in dose, call your healthcare provider. You may get drowsy or dizzy. Do not drive, use machinery, or do anything that needs mental alertness until you know how this medicine affects you. Do not stand or sit up quickly, especially if you are an older patient. This reduces the risk of dizzy or fainting spells. Alcohol may interfere with the effect of this medicine. Avoid alcoholic drinks. This medicine can cause an increase in blood pressure. This medicine can also cause a sudden drop in your blood pressure, which may make you feel faint and increase the chance of a fall. These effects are most common when you first start the medicine or when the dose is increased, or during use of other medicines that can cause a sudden drop in blood pressure. Check with your doctor for instructions on monitoring your blood pressure while taking this medicine. Your mouth may get dry. Chewing sugarless gum or sucking hard candy, and drinking plenty of  water, may help. Contact your doctor if the problem does not go away or is severe. What side effects may I notice from receiving this medicine? Side effects that you should report to your doctor or health care professional as soon as possible:  allergic reactions like skin rash, itching or hives, swelling of the face, lips, or tongue  anxious  breathing problems  confusion  changes in vision  chest pain  confusion  elevated mood, decreased need for sleep, racing thoughts, impulsive behavior  eye pain  fast, irregular heartbeat  feeling faint or lightheaded, falls  feeling agitated, angry, or irritable  hallucination, loss of contact with reality  high blood pressure  loss of balance or coordination  palpitations  redness, blistering, peeling or loosening of the skin, including inside the mouth  restlessness, pacing, inability to keep still  seizures  stiff muscles  suicidal thoughts or other mood changes  trouble passing urine or change in the amount of urine  trouble sleeping  unusual bleeding or bruising  unusually weak or tired  vomiting  yellowing of the eyes or skin Side effects that usually do not require medical attention (report to your doctor or health care professional if they continue or are bothersome):  change in sex drive or performance  change in appetite or weight  constipation  dizziness  dry mouth  headache  increased sweating  nausea  tired This list may not describe all possible side effects. Call your doctor for medical advice about side effects. You may report side effects to FDA at 1-800-FDA-1088. Where should I keep my medicine? Keep out of the reach of children. Store at room temperature between 15 and 30 degrees C (59 to 86 degrees F). Throw away any unused medicine after the expiration date. NOTE: This sheet is a summary. It may not cover all possible information. If you have questions about this medicine, talk  to your doctor, pharmacist, or health care provider.  2020 Elsevier/Gold Standard (2018-08-06 13:47:50)

## 2019-10-03 NOTE — Progress Notes (Deleted)
+ This visit occurred during the SARS-CoV-2 public health emergency.  Safety protocols were in place, including screening questions prior to the visit, additional usage of staff PPE, and extensive cleaning of exam room while observing appropriate contact time as indicated for disinfecting solutions.  Subjective:     Patient ID: Chad Miller , male    DOB: January 25, 1941 , 79 y.o.   MRN: [id number]   Chief Complaint  Patient presents with  . Hypertension    HPI  He presents for BP check. He reports compliance with meds.   Today, he is concerned about allergy sx. Also with chronic back pain.   Hypertension This is a chronic problem. The current episode started more than 1 year ago. The problem has been gradually improving since onset. The problem is controlled. Pertinent negatives include no blurred vision, chest pain or shortness of breath. Risk factors for coronary artery disease include dyslipidemia and male gender. Past treatments include angiotensin blockers. Hypertensive end-organ damage includes kidney disease.     Past Medical History:  Diagnosis Date  . High blood pressure   . High cholesterol   . Loss of vision    Right     Family History  Problem Relation Age of Onset  . Heart disease Mother   . Lung cancer Father   . Heart disease Brother   . Heart disease Brother   . Asthma Sister   . Allergic rhinitis Neg Hx   . Eczema Neg Hx   . Urticaria Neg Hx      Current Outpatient Medications:  .  aspirin EC 81 MG tablet, Take 81 mg by mouth daily., Disp: , Rfl:  .  atorvastatin (LIPITOR) 10 MG tablet, Take 1 tablet (10 mg total) by mouth daily., Disp: 90 tablet, Rfl: 2 .  co-enzyme Q-10 30 MG capsule, Take 30 mg by mouth daily., Disp: , Rfl:  .  loratadine (CLARITIN) 10 MG tablet, Take 10 mg by mouth daily., Disp: , Rfl:  .  mometasone (NASONEX) 50 MCG/ACT nasal spray, Place 2 sprays into the nose daily., Disp: , Rfl:  .  Multiple Vitamin (MULTIVITAMIN) tablet, Take  1 tablet by mouth daily., Disp: , Rfl:  .  olmesartan (BENICAR) 20 MG tablet, Take 1 tablet (20 mg total) by mouth daily., Disp: 90 tablet, Rfl: 2 .  tadalafil (CIALIS) 5 MG tablet, Take 1 tablet (5 mg total) by mouth daily as needed for erectile dysfunction., Disp: 10 tablet, Rfl: 0 .  azelastine (ASTELIN) 0.1 % nasal spray, Place 2 sprays into both nostrils 2 (two) times daily. Use in each nostril as directed (Patient not taking: Reported on 09/29/2019), Disp: 30 mL, Rfl: 5 .  DULoxetine (CYMBALTA) 20 MG capsule, Take 1 capsule (20 mg total) by mouth daily., Disp: 30 capsule, Rfl: 2 .  Omega 3 1000 MG CAPS, Take by mouth., Disp: , Rfl:  .  ranitidine (ZANTAC) 150 MG tablet, Take 150 mg by mouth 2 (two) times daily., Disp: , Rfl:    No Known Allergies   Review of Systems  Constitutional: Negative.   HENT: Positive for postnasal drip and rhinorrhea.   Respiratory: Negative.   Cardiovascular: Negative.   Gastrointestinal: Negative.   Neurological: Negative.   Psychiatric/Behavioral: Negative.      Today's Vitals   09/29/19 1410  BP: 116/74  Pulse: 76  Temp: (!) 97.5 F (36.4 C)  TempSrc: Oral  Weight: 155 lb (70.3 kg)  Height: 5' 8.6" (1.742 m)  PainSc: 5  PainLoc: Back   Body mass index is 23.16 kg/m.   Objective:  Physical Exam      Assessment And Plan:     1. Hypertensive nephropathy  Chronic, well controlled. He will continue with current meds. He is encouraged to avoid adding salt to his foods. I will check renal function.   - CMP14+EGFR  2. Chronic renal disease, stage II  Chronic, I will check renal function. He is encouraged to stay well hydrated and to keep BP well controlled to avoid progression of CKD.   3. Pure hypercholesterolemia  Chronic, previous labs reviewed in full detail. He will continue with current meds. He is encouraged t  4. Seasonal allergic rhinitis due to pollen  Chronic. He wants to know if he can resume nasal sprays mometasone and  azelastine. He is advised to resume as directed.   5. Myalgia  I will check labs. He is encouraged to stay well hydrated. He may benefit from magnesium supplementation, 448m nightly as needed.   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 He may benefit from physical therapy. He may also benefit from dry needling.   7. Chronic pain syndrome  Chronic. He agrees to try duloxetine, 245mnightly. Possible side effects were discussed with the patient. He agrees to rto in four weeks for re-evaluation.    8. Pain in both hands  Chronic, I will check labs as listed below. He is encouraged to follow an anti-inflammatory diet. I will make further recommendations once his labs are available for review.   - ANA, IFA (with reflex) - CYCLIC CITRUL PEPTIDE ANTIBODY, IGG/IGA - Rheumatoid factor - Sedimentation rate - Uric acid  9. Dysphagia, unspecified type  He declined barium swallow at this time. He wants to first see if sx persist.    RoMaximino GreenlandMD    THE PATIENT IS ENCOURAGED TO PRACTICE SOCIAL DISTANCING DUE TO THE COVID-19 PANDEMIC.

## 2019-10-04 DIAGNOSIS — I129 Hypertensive chronic kidney disease with stage 1 through stage 4 chronic kidney disease, or unspecified chronic kidney disease: Secondary | ICD-10-CM | POA: Diagnosis not present

## 2019-10-04 DIAGNOSIS — M79642 Pain in left hand: Secondary | ICD-10-CM | POA: Diagnosis not present

## 2019-10-04 DIAGNOSIS — M79641 Pain in right hand: Secondary | ICD-10-CM | POA: Diagnosis not present

## 2019-10-04 DIAGNOSIS — M791 Myalgia, unspecified site: Secondary | ICD-10-CM | POA: Diagnosis not present

## 2019-10-05 LAB — CMP14+EGFR
ALT: 19 IU/L (ref 0–44)
AST: 21 IU/L (ref 0–40)
Albumin/Globulin Ratio: 1.3 (ref 1.2–2.2)
Albumin: 4.4 g/dL (ref 3.7–4.7)
Alkaline Phosphatase: 82 IU/L (ref 48–121)
BUN/Creatinine Ratio: 17 (ref 10–24)
BUN: 17 mg/dL (ref 8–27)
Bilirubin Total: 0.5 mg/dL (ref 0.0–1.2)
CO2: 22 mmol/L (ref 20–29)
Calcium: 9.5 mg/dL (ref 8.6–10.2)
Chloride: 103 mmol/L (ref 96–106)
Creatinine, Ser: 1.02 mg/dL (ref 0.76–1.27)
GFR calc Af Amer: 80 mL/min/{1.73_m2} (ref >59)
GFR calc non Af Amer: 70 mL/min/{1.73_m2} (ref >59)
Globulin, Total: 3.3 g/dL (ref 1.5–4.5)
Glucose: 90 mg/dL (ref 65–99)
Potassium: 4 mmol/L (ref 3.5–5.2)
Sodium: 139 mmol/L (ref 134–144)
Total Protein: 7.7 g/dL (ref 6.0–8.5)

## 2019-10-05 LAB — URIC ACID: Uric Acid: 6.5 mg/dL (ref 3.8–8.4)

## 2019-10-05 LAB — RHEUMATOID FACTOR: Rheumatoid fact SerPl-aCnc: <10 IU/mL (ref 0.0–13.9)

## 2019-10-05 LAB — CYCLIC CITRUL PEPTIDE ANTIBODY, IGG/IGA: Cyclic Citrullin Peptide Ab: 4 units (ref 0–19)

## 2019-10-05 LAB — ANTINUCLEAR ANTIBODIES, IFA: ANA Titer 1: NEGATIVE

## 2019-10-05 LAB — CK: Total CK: 176 U/L (ref 41–331)

## 2019-10-05 LAB — SEDIMENTATION RATE: Sed Rate: 6 mm/hr (ref 0–30)

## 2019-10-17 NOTE — Progress Notes (Signed)
This visit occurred during the SARS-CoV-2 public health emergency.  Safety protocols were in place, including screening questions prior to the visit, additional usage of staff PPE, and extensive cleaning of exam room while observing appropriate contact time as indicated for disinfecting solutions.  Subjective:     Patient ID: Chad Miller , male    DOB: 23-Dec-1940 , 79 y.o.   MRN: [id number]   Chief Complaint  Patient presents with  . Hypertension    HPI  He presents today for BP check. He reports compliance with meds. He denies chest pain, headaches, and palpitations.   He is also concerned about allergy sx. He c/o postnasal drip and rhinorrhea. He takes his meds from time to time.   Hypertension This is a chronic problem. The current episode started more than 1 year ago. The problem has been gradually improving since onset. The problem is controlled. Pertinent negatives include no blurred vision, chest pain or shortness of breath. Risk factors for coronary artery disease include dyslipidemia and male gender. Past treatments include angiotensin blockers. Hypertensive end-organ damage includes kidney disease.     Past Medical History:  Diagnosis Date  . High blood pressure   . High cholesterol   . Loss of vision    Right     Family History  Problem Relation Age of Onset  . Heart disease Mother   . Lung cancer Father   . Heart disease Brother   . Heart disease Brother   . Asthma Sister   . Allergic rhinitis Neg Hx   . Eczema Neg Hx   . Urticaria Neg Hx      Current Outpatient Medications:  .  aspirin EC 81 MG tablet, Take 81 mg by mouth daily., Disp: , Rfl:  .  atorvastatin (LIPITOR) 10 MG tablet, Take 1 tablet (10 mg total) by mouth daily., Disp: 90 tablet, Rfl: 2 .  co-enzyme Q-10 30 MG capsule, Take 30 mg by mouth daily., Disp: , Rfl:  .  loratadine (CLARITIN) 10 MG tablet, Take 10 mg by mouth daily., Disp: , Rfl:  .  mometasone (NASONEX) 50 MCG/ACT nasal spray,  Place 2 sprays into the nose daily., Disp: , Rfl:  .  Multiple Vitamin (MULTIVITAMIN) tablet, Take 1 tablet by mouth daily., Disp: , Rfl:  .  olmesartan (BENICAR) 20 MG tablet, Take 1 tablet (20 mg total) by mouth daily., Disp: 90 tablet, Rfl: 2 .  tadalafil (CIALIS) 5 MG tablet, Take 1 tablet (5 mg total) by mouth daily as needed for erectile dysfunction., Disp: 10 tablet, Rfl: 0 .  azelastine (ASTELIN) 0.1 % nasal spray, Place 2 sprays into both nostrils 2 (two) times daily. Use in each nostril as directed (Patient not taking: Reported on 09/29/2019), Disp: 30 mL, Rfl: 5 .  DULoxetine (CYMBALTA) 20 MG capsule, Take 1 capsule (20 mg total) by mouth daily., Disp: 30 capsule, Rfl: 2 .  Omega 3 1000 MG CAPS, Take by mouth., Disp: , Rfl:  .  ranitidine (ZANTAC) 150 MG tablet, Take 150 mg by mouth 2 (two) times daily., Disp: , Rfl:    No Known Allergies   Review of Systems  Constitutional: Negative.   HENT: Positive for postnasal drip and rhinorrhea.   Eyes: Negative for blurred vision.  Respiratory: Negative.  Negative for shortness of breath.   Cardiovascular: Negative.  Negative for chest pain.  Gastrointestinal: Negative.   Musculoskeletal: Positive for arthralgias, back pain and myalgias.       He c/o b/l hand  pain. Has extreme stiffness upon awakening.   Neurological: Negative.   Psychiatric/Behavioral: Negative.      Today's Vitals   09/29/19 1410  BP: 116/74  Pulse: 76  Temp: (!) 97.5 F (36.4 C)  TempSrc: Oral  Weight: 155 lb (70.3 kg)  Height: 5' 8.6" (1.742 m)  PainSc: 5   PainLoc: Back   Body mass index is 23.16 kg/m.   Objective:  Physical Exam Vitals and nursing note reviewed.  Constitutional:      Appearance: Normal appearance.  Cardiovascular:     Rate and Rhythm: Normal rate and regular rhythm.     Heart sounds: Normal heart sounds.  Pulmonary:     Effort: Pulmonary effort is normal.     Breath sounds: Normal breath sounds.  Musculoskeletal:      Comments: Neg squeeze test. DIP swelling b/l hands.   Skin:    General: Skin is warm.     Capillary Refill: Capillary refill takes 2 to 3 seconds.  Neurological:     General: No focal deficit present.     Mental Status: He is alert.  Psychiatric:        Mood and Affect: Mood normal.         Assessment And Plan:     1. Hypertensive nephropathy  Chronic, well controlled. He will continue with current meds. He is encouraged to avoid adding salt to his foods. I will check renal function.   - CMP14+EGFR  2. Chronic renal disease, stage II  Chronic, I will check renal function today. He is encouraged to keep BP controlled and stay well hydrated to prevent progression of CKD.  3. Pure hypercholesterolemia  Chronic, previous labs reviewed in full detail. He will continue with current meds. He is encouraged to avoid fried food and to exercise at least 150 minutes per week.   4. Seasonal allergic rhinitis due to pollen  Chronic. Advised to resume use of mometasone NS and azelastine. He will let me know if his sx persist.   5. Myalgia  Chronic, intermittent. He is encouraged to stay well hydrated. He may also benefit from magnesium supplementation, 458m nightly. He is advised he can get this over the counter.   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 He may benefit from PT or dry needling. He does not wish to pursue this at this time.   7. Chronic pain syndrome  Chronic. He agrees to start duloxetine, 289mdaily. Possible side effects were discussed with the patient. He is advised to take with evening meal. He will rto in four weeks for re-evaluation.   8. Pain in both hands  I will check labs as listed below. I will make further recommendations once his labs are available for review.   - ANA, IFA (with reflex) - CYCLIC CITRUL PEPTIDE ANTIBODY, IGG/IGA - Rheumatoid factor - Sedimentation rate - Uric acid  9. Dysphagia,  unspecified type  He declined barium swallow at this time. He will let me know if he chooses to move forward.   RoMaximino GreenlandMD    THE PATIENT IS ENCOURAGED TO PRACTICE SOCIAL DISTANCING DUE TO THE COVID-19 PANDEMIC.

## 2019-10-19 DIAGNOSIS — N401 Enlarged prostate with lower urinary tract symptoms: Secondary | ICD-10-CM | POA: Diagnosis not present

## 2019-10-19 DIAGNOSIS — N5201 Erectile dysfunction due to arterial insufficiency: Secondary | ICD-10-CM | POA: Diagnosis not present

## 2019-10-19 DIAGNOSIS — R350 Frequency of micturition: Secondary | ICD-10-CM | POA: Diagnosis not present

## 2019-10-28 ENCOUNTER — Ambulatory Visit: Payer: Medicare Other | Admitting: Internal Medicine

## 2020-01-19 ENCOUNTER — Other Ambulatory Visit: Payer: Self-pay

## 2020-01-19 ENCOUNTER — Ambulatory Visit (INDEPENDENT_AMBULATORY_CARE_PROVIDER_SITE_OTHER): Payer: Medicare Other | Admitting: Allergy

## 2020-01-19 ENCOUNTER — Encounter: Payer: Self-pay | Admitting: Allergy

## 2020-01-19 VITALS — BP 140/60 | HR 69 | Temp 98.2°F | Resp 18 | Ht 69.0 in | Wt 158.2 lb

## 2020-01-19 DIAGNOSIS — J3089 Other allergic rhinitis: Secondary | ICD-10-CM

## 2020-01-19 DIAGNOSIS — R42 Dizziness and giddiness: Secondary | ICD-10-CM | POA: Diagnosis not present

## 2020-01-19 MED ORDER — TRIAMCINOLONE ACETONIDE 55 MCG/ACT NA AERO
2.0000 | INHALATION_SPRAY | Freq: Every day | NASAL | 5 refills | Status: DC
Start: 2020-01-19 — End: 2020-05-22

## 2020-01-19 NOTE — Patient Instructions (Addendum)
Allergic rhinitis   - continue avoidance measures for grasses, dust mites  - continue loratadine 10 mg daily  - use nasal steroid spray like mometasone, fluticasone or triamcinolone nasal spray 2 sprays each nostril daily.  Use for 1 to 2 weeks at a time before stopping once symptoms improve. Nasal steroid sprays help with sinus congestion  - if having nasal drainage/postnasal drip use nasal antihistamine, Azelastin, 2 sprays each nostril up to twice a day  - use nasal saline rinse or nasal saline spray to help keep nose moisturized and flushed out  Vertigo  - can be due to positional changes or shifts in otoliths in the inner ear  - recommend use of nasal steroid spray as above to help with congestion.   - if vertigo continues then would recommend ENT referral  Follow-up 4-6 months or sooner if needed

## 2020-01-19 NOTE — Progress Notes (Signed)
Follow-up Note  RE: Chad Miller MRN: [id number] DOB: 14-Aug-1940 Date of Office Visit: 01/19/2020   History of present illness: Chad Miller is a 79 y.o. male presenting today for follow-up of allergic rhinitis with postnasal drip and cough.  He also has history of of large local type reactions to insect bites.  He was last seen in the office on 12/11/2017 by myself.  He returns today as he states he has been having ear clogged sensation, pressure in the forehead, as well as positional type vertigo.  He states he is using loratadine daily as well as the Astelin for nasal drainage control.  Currently not using a nasal steroid spray.  He states he is doing a nasal saline spray and is also using a Vicks nasal decongestant type spray.  He is currently not doing any saline rinses but states he has performed this in the past. He also has some other questions today including having vertigo and if this could be related to his allergies. He has a sister that has asthma he is wondering if he could potentially develop asthma at his age.  He states he has been having a low back pain and is wondering if that could be his from his lungs.  He denies having any shortness of breath, wheezing or chest tightness.  He has had cough related to nasal drainage but this is not currently an issue.    Review of systems: Review of Systems  Constitutional: Negative.   HENT:       See HPI  Eyes: Negative.   Respiratory: Negative.   Cardiovascular: Negative.   Gastrointestinal: Negative.   Musculoskeletal: Negative.   Skin: Negative.   Neurological: Negative.     All other systems negative unless noted above in HPI  Past medical/social/surgical/family history have been reviewed and are unchanged unless specifically indicated below.  No changes  Medication List: Current Outpatient Medications  Medication Sig Dispense Refill  . aspirin EC 81 MG tablet Take 81 mg by mouth daily.    Marland Kitchen atorvastatin  (LIPITOR) 10 MG tablet Take 1 tablet (10 mg total) by mouth daily. 90 tablet 2  . azelastine (ASTELIN) 0.1 % nasal spray Place 2 sprays into both nostrils 2 (two) times daily. Use in each nostril as directed 30 mL 5  . co-enzyme Q-10 30 MG capsule Take 30 mg by mouth daily.    Marland Kitchen loratadine (CLARITIN) 10 MG tablet Take 10 mg by mouth daily.    . mometasone (NASONEX) 50 MCG/ACT nasal spray Place 2 sprays into the nose daily.    . Multiple Vitamin (MULTIVITAMIN) tablet Take 1 tablet by mouth daily.    Marland Kitchen olmesartan (BENICAR) 20 MG tablet Take 1 tablet (20 mg total) by mouth daily. 90 tablet 2  . Omega 3 1000 MG CAPS Take by mouth.    . DULoxetine (CYMBALTA) 20 MG capsule Take 1 capsule (20 mg total) by mouth daily. (Patient not taking: Reported on 01/19/2020) 30 capsule 2  . ranitidine (ZANTAC) 150 MG tablet Take 150 mg by mouth 2 (two) times daily. (Patient not taking: Reported on 01/19/2020)    . tadalafil (CIALIS) 5 MG tablet Take 1 tablet (5 mg total) by mouth daily as needed for erectile dysfunction. (Patient not taking: Reported on 01/19/2020) 10 tablet 0  . triamcinolone (NASACORT) 55 MCG/ACT AERO nasal inhaler Place 2 sprays into the nose daily. For nasal congestion 16 g 5   No current facility-administered medications for this visit.  Known medication allergies: No Known Allergies   Physical examination: Blood pressure 140/60, pulse 69, temperature 98.2 F (36.8 C), resp. rate 18, height 5\' 9"  (1.753 m), weight 158 lb 3.2 oz (71.8 kg), SpO2 96 %.  General: Alert, interactive, in no acute distress. HEENT: TMs pearly gray, turbinates mildly edematous without discharge, post-pharynx non erythematous. Neck: Supple without lymphadenopathy. Lungs: Clear to auscultation without wheezing, rhonchi or rales. {no increased work of breathing. CV: Normal S1, S2 without murmurs. Abdomen: Nondistended, nontender. Skin: Warm and dry, without lesions or rashes. Extremities:  No clubbing, cyanosis or  edema. Neuro:   Grossly intact.  Diagnositics/Labs: None today  Assessment and plan:   Allergic rhinitis   - continue avoidance measures for grasses, dust mites  - continue loratadine 10 mg daily  - use nasal steroid spray like mometasone, fluticasone or triamcinolone nasal spray 2 sprays each nostril daily.  Use for 1 to 2 weeks at a time before stopping once symptoms improve. Nasal steroid sprays help with sinus congestion  - if having nasal drainage/postnasal drip use nasal antihistamine, Azelastin, 2 sprays each nostril up to twice a day  - use nasal saline rinse or nasal saline spray to help keep nose moisturized and flushed out  Vertigo  - can be due to positional changes or shifts in otoliths in the inner ear  - recommend use of nasal steroid spray as above to help with congestion.   - if vertigo continues then would recommend ENT referral  I discussed with him the questions he had and do not have any indication that he will develop asthma.  He has no symptoms consistent with asthma at this time. Follow-up 4-6 months or sooner if needed    I appreciate the opportunity to take part in [place] care. Please do not hesitate to contact me with questions.  Sincerely,   [place], MD Allergy/Immunology [facility]

## 2020-01-21 DIAGNOSIS — Z23 Encounter for immunization: Secondary | ICD-10-CM | POA: Diagnosis not present

## 2020-02-15 DIAGNOSIS — Z23 Encounter for immunization: Secondary | ICD-10-CM | POA: Diagnosis not present

## 2020-04-07 ENCOUNTER — Ambulatory Visit (INDEPENDENT_AMBULATORY_CARE_PROVIDER_SITE_OTHER): Payer: Medicare Other

## 2020-04-07 ENCOUNTER — Ambulatory Visit (INDEPENDENT_AMBULATORY_CARE_PROVIDER_SITE_OTHER): Payer: Medicare Other | Admitting: Surgical

## 2020-04-07 DIAGNOSIS — M79605 Pain in left leg: Secondary | ICD-10-CM

## 2020-04-07 DIAGNOSIS — M5410 Radiculopathy, site unspecified: Secondary | ICD-10-CM

## 2020-04-12 ENCOUNTER — Ambulatory Visit (INDEPENDENT_AMBULATORY_CARE_PROVIDER_SITE_OTHER): Payer: Medicare Other | Admitting: Internal Medicine

## 2020-04-12 ENCOUNTER — Encounter: Payer: Self-pay | Admitting: Internal Medicine

## 2020-04-12 ENCOUNTER — Ambulatory Visit (INDEPENDENT_AMBULATORY_CARE_PROVIDER_SITE_OTHER): Payer: Medicare Other

## 2020-04-12 ENCOUNTER — Other Ambulatory Visit: Payer: Self-pay

## 2020-04-12 VITALS — BP 142/84 | HR 53 | Temp 97.5°F | Ht 69.0 in | Wt 148.2 lb

## 2020-04-12 DIAGNOSIS — Z0000 Encounter for general adult medical examination without abnormal findings: Secondary | ICD-10-CM

## 2020-04-12 DIAGNOSIS — Z79899 Other long term (current) drug therapy: Secondary | ICD-10-CM

## 2020-04-12 DIAGNOSIS — R634 Abnormal weight loss: Secondary | ICD-10-CM | POA: Diagnosis not present

## 2020-04-12 DIAGNOSIS — N182 Chronic kidney disease, stage 2 (mild): Secondary | ICD-10-CM | POA: Diagnosis not present

## 2020-04-12 DIAGNOSIS — I129 Hypertensive chronic kidney disease with stage 1 through stage 4 chronic kidney disease, or unspecified chronic kidney disease: Secondary | ICD-10-CM

## 2020-04-12 DIAGNOSIS — G894 Chronic pain syndrome: Secondary | ICD-10-CM

## 2020-04-12 DIAGNOSIS — E7800 Pure hypercholesterolemia, unspecified: Secondary | ICD-10-CM | POA: Diagnosis not present

## 2020-04-12 DIAGNOSIS — M48062 Spinal stenosis, lumbar region with neurogenic claudication: Secondary | ICD-10-CM

## 2020-04-12 DIAGNOSIS — Z1159 Encounter for screening for other viral diseases: Secondary | ICD-10-CM | POA: Diagnosis not present

## 2020-04-12 LAB — POCT URINALYSIS DIPSTICK
Bilirubin, UA: NEGATIVE
Blood, UA: NEGATIVE
Glucose, UA: NEGATIVE
Ketones, UA: NEGATIVE
Leukocytes, UA: NEGATIVE
Nitrite, UA: NEGATIVE
Protein, UA: NEGATIVE
Spec Grav, UA: 1.02 (ref 1.010–1.025)
Urobilinogen, UA: 0.2 E.U./dL
pH, UA: 5.5 (ref 5.0–8.0)

## 2020-04-12 LAB — POCT UA - MICROALBUMIN
Albumin/Creatinine Ratio, Urine, POC: <30
Creatinine, POC: 200 mg/dL
Microalbumin Ur, POC: 30 mg/L

## 2020-04-12 MED ORDER — ATORVASTATIN CALCIUM 10 MG PO TABS
10.0000 mg | ORAL_TABLET | Freq: Every day | ORAL | 2 refills | Status: DC
Start: 2020-04-12 — End: 2021-04-24

## 2020-04-12 MED ORDER — OLMESARTAN MEDOXOMIL 20 MG PO TABS
20.0000 mg | ORAL_TABLET | Freq: Every day | ORAL | 2 refills | Status: DC
Start: 2020-04-12 — End: 2020-09-25

## 2020-04-12 NOTE — Addendum Note (Signed)
Addended by: Elisha Ponder E on: 04/12/2020 02:40 PM   Modules accepted: Orders

## 2020-04-12 NOTE — Progress Notes (Signed)
This visit occurred during the SARS-CoV-2 public health emergency.  Safety protocols were in place, including screening questions prior to the visit, additional usage of staff PPE, and extensive cleaning of exam room while observing appropriate contact time as indicated for disinfecting solutions.  Subjective:   Chad Miller is a 79 y.o. male who presents for Medicare Annual/Subsequent preventive examination.  Review of Systems     Cardiac Risk Factors include: advanced age (>52men, >45 women);sedentary lifestyle;male gender     Objective:    Today's Vitals   04/12/20 1011 04/12/20 1020  BP: (!) 142/84   Pulse: (!) 53   Temp: (!) 97.5 F (36.4 C)   TempSrc: Oral   SpO2: 95%   Weight: 148 lb 3.2 oz (67.2 kg)   Height: 5\' 9"  (1.753 m)   PainSc:  6    Body mass index is 21.89 kg/m.  Advanced Directives 04/12/2020 04/01/2019  Does Patient Have a Medical Advance Directive? Yes Yes  Type of 13/04/2019 of [place];Living will Healthcare Power of [place];Living will  Copy of Healthcare Power of Attorney in Chart? No - copy requested No - copy requested    Current Medications (verified) Outpatient Encounter Medications as of 04/12/2020  Medication Sig  . acetaminophen (TYLENOL) 325 MG tablet Take 650 mg by mouth every 6 (six) hours as needed.  04/14/2020 aspirin EC 81 MG tablet Take 81 mg by mouth daily.  Marland Kitchen atorvastatin (LIPITOR) 10 MG tablet Take 1 tablet (10 mg total) by mouth daily.  Marland Kitchen co-enzyme Q-10 30 MG capsule Take 30 mg by mouth daily.  Marland Kitchen loratadine (CLARITIN) 10 MG tablet Take 10 mg by mouth daily.  . Melatonin 5 MG CAPS Take by mouth.  . Multiple Vitamin (MULTIVITAMIN) tablet Take 1 tablet by mouth daily.  Marland Kitchen olmesartan (BENICAR) 20 MG tablet Take 1 tablet (20 mg total) by mouth daily.  . Omega 3 1000 MG CAPS Take by mouth.  . saw palmetto 500 MG capsule Take 500 mg by mouth daily.  . tadalafil (CIALIS) 5 MG tablet Take 1 tablet (5 mg total) by mouth  daily as needed for erectile dysfunction.  . triamcinolone (NASACORT ALLERGY 24HR) 55 MCG/ACT AERO nasal inhaler Place 2 sprays into the nose daily.  Marland Kitchen triamcinolone (NASACORT) 55 MCG/ACT AERO nasal inhaler Place 2 sprays into the nose daily. For nasal congestion  . azelastine (ASTELIN) 0.1 % nasal spray Place 2 sprays into both nostrils 2 (two) times daily. Use in each nostril as directed (Patient not taking: Reported on 04/12/2020)  . DULoxetine (CYMBALTA) 20 MG capsule Take 1 capsule (20 mg total) by mouth daily. (Patient not taking: Reported on 01/19/2020)  . mometasone (NASONEX) 50 MCG/ACT nasal spray Place 2 sprays into the nose daily. (Patient not taking: Reported on 04/12/2020)  . ranitidine (ZANTAC) 150 MG tablet Take 150 mg by mouth 2 (two) times daily. (Patient not taking: Reported on 01/19/2020)   No facility-administered encounter medications on file as of 04/12/2020.    Allergies (verified) Patient has no known allergies.   History: Past Medical History:  Diagnosis Date  . High blood pressure   . High cholesterol   . Loss of vision    Right   Past Surgical History:  Procedure Laterality Date  . CATARACT EXTRACTION    . REPLACEMENT TOTAL KNEE Left 2004   Family History  Problem Relation Age of Onset  . Heart disease Mother   . Lung cancer Father   . Heart disease Brother   .  Heart disease Brother   . Asthma Sister   . Allergic rhinitis Neg Hx   . Eczema Neg Hx   . Urticaria Neg Hx    Social History   Socioeconomic History  . Marital status: Married    Spouse name: Not on file  . Number of children: 2  . Years of education: College  . Highest education level: Not on file  Occupational History  . Occupation: Retired  Tobacco Use  . Smoking status: Never Smoker  . Smokeless tobacco: Never Used  Vaping Use  . Vaping Use: Never used  Substance and Sexual Activity  . Alcohol use: No    Comment: Quit 2015  . Drug use: No  . Sexual activity: Yes    Partners:  Female    Comment: Married  Other Topics Concern  . Not on file  Social History Narrative   Lives at home w/ his wife   Right-handed   Caffeine: 1 cup per day   Social Determinants of Health   Financial Resource Strain: Low Risk   . Difficulty of Paying Living Expenses: Not hard at all  Food Insecurity: No Food Insecurity  . Worried About Programme researcher, broadcasting/film/video in the Last Year: Never true  . Ran Out of Food in the Last Year: Never true  Transportation Needs: No Transportation Needs  . Lack of Transportation (Medical): No  . Lack of Transportation (Non-Medical): No  Physical Activity: Inactive  . Days of Exercise per Week: 0 days  . Minutes of Exercise per Session: 0 min  Stress: No Stress Concern Present  . Feeling of Stress : Not at all  Social Connections:   . Frequency of Communication with Friends and Family: Not on file  . Frequency of Social Gatherings with Friends and Family: Not on file  . Attends Religious Services: Not on file  . Active Member of Clubs or Organizations: Not on file  . Attends Banker Meetings: Not on file  . Marital Status: Not on file    Tobacco Counseling Counseling given: Not Answered   Clinical Intake:  Pre-visit preparation completed: Yes  Pain : 0-10 Pain Score: 6  Pain Type: Chronic pain Pain Location: Back (left knee) Pain Orientation: Lower Pain Descriptors / Indicators: Constant, Aching Pain Onset: More than a month ago Pain Frequency: Constant     Nutritional Status: BMI of 19-24  Normal Nutritional Risks: None Diabetes: No  How often do you need to have someone help you when you read instructions, pamphlets, or other written materials from your doctor or pharmacy?: 1 - Never What is the last grade level you completed in school?: master's degree  Diabetic? no  Interpreter Needed?: No  Information entered by :: NAllen LPN   Activities of Daily Living In your present state of health, do you have any  difficulty performing the following activities: 04/12/2020  Hearing? 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 N  Walking or climbing stairs? Y  Dressing or bathing? N  Doing errands, shopping? N  Preparing Food and eating ? N  Using the Toilet? N  In the past six months, have you accidently leaked urine? N  Do you have problems with loss of bowel control? N  Managing your Medications? N  Managing your Finances? N  Housekeeping or managing your Housekeeping? N  Some recent data might be hidden    Patient Care Team: [name], MD as PCP -  General (Internal Medicine)  Indicate any recent Medical Services you may have received from other than [facility] providers in the past year (date may be approximate).     Assessment:   This is a routine wellness examination for Chad Miller.  Hearing/Vision screen  Hearing Screening   125Hz  250Hz  500Hz  1000Hz  2000Hz  3000Hz  4000Hz  6000Hz  8000Hz   Right ear:           Left ear:           Vision Screening Comments: Regular eye exams, Dr.  Dietary issues and exercise activities discussed: Current Exercise Habits: The patient does not participate in regular exercise at present  Goals    . Patient Stated     04/01/2019, wants to do more exercise with the equipment at home    . Patient Stated     04/12/2020, keep living      Depression Screen PHQ 2/9 Scores 04/12/2020 04/01/2019 07/15/2018  PHQ - 2 Score 0 0 0  PHQ- 9 Score - 3 -    Fall Risk Fall Risk  04/12/2020 04/01/2019 04/01/2019 07/15/2018 12/06/2016  Falls in the past year? 1 0 0 0 Yes  Comment lose balance - - - Emmi Telephone Survey: data to providers prior to load  Number falls in past yr: 1 - - - 2 or more  Comment - - - - Emmi Telephone Survey Actual Response = 2  Injury with Fall? 0 - - - No  Risk for fall due to : Impaired mobility;Medication side effect Impaired vision;Medication side effect - - -  Follow up Falls  evaluation completed;Education provided;Falls prevention discussed Falls evaluation completed;Education provided;Falls prevention discussed - - -    Any stairs in or around the home? Yes  If so, are there any without handrails? No  Home free of loose throw rugs in walkways, pet beds, electrical cords, etc? Yes  Adequate lighting in your home to reduce risk of falls? Yes   ASSISTIVE DEVICES UTILIZED TO PREVENT FALLS:  Life alert? Yes  Use of a cane, walker or w/c? Yes  Grab bars in the bathroom? Yes  Shower chair or bench in shower? Yes  Elevated toilet seat or a handicapped toilet? Yes   TIMED UP AND GO:  Was the test performed? No .    Gait slow and steady with assistive device  Cognitive Function:     6CIT Screen 04/12/2020 04/01/2019  What Year? 0 points 0 points  What month? 0 points 0 points  What time? 3 points 3 points  Count back from 20 0 points 0 points  Months in reverse 0 points 0 points  Repeat phrase 2 points 2 points  Total Score 5 5    Immunizations Immunization History  Administered Date(s) Administered  . Fluad Quad(high Dose 65+) 01/21/2020  . Influenza, High Dose Seasonal PF 01/28/2017, 02/04/2019  . Influenza-Unspecified 01/18/2013, 01/18/2018, 02/04/2019  . PFIZER SARS-COV-2 Vaccination 07/02/2019, 07/27/2019, 02/15/2020  . Pneumococcal Polysaccharide-23 04/30/2010, 03/18/2016  . Zoster Recombinat (Shingrix) 07/21/2017    TDAP status: Up to date Flu Vaccine status: Up to date Pneumococcal vaccine status: Up to date Covid-19 vaccine status: Completed vaccines  Qualifies for Shingles Vaccine? Yes   Zostavax completed No   Shingrix Completed?: may need second dose  Screening Tests Health Maintenance  Topic Date Due  . Hepatitis C Screening  Never done  . TETANUS/TDAP  06/11/2021  . INFLUENZA VACCINE  Completed  . COVID-19 Vaccine  Completed  . PNA vac Low Risk  Adult  Completed    Health Maintenance  Health Maintenance Due  Topic  Date Due  . Hepatitis C Screening  Never done    Colorectal cancer screening: No longer required.   Lung Cancer Screening: (Low Dose CT Chest recommended if Age 52-80 years, 30 pack-year currently smoking OR have quit w/in 15years.) does not qualify.   Lung Cancer Screening Referral: no  Additional Screening:  Hepatitis C Screening: does qualify; today  Vision Screening: Recommended annual ophthalmology exams for early detection of glaucoma and other disorders of the eye. Is the patient up to date with their annual eye exam?  Yes  Who is the provider or what is the name of the office in which the patient attends annual eye exams? Dr. Charlotte SanesMcCuen If pt is not established with a provider, would they like to be referred to a provider to establish care? No .   Dental Screening: Recommended annual dental exams for proper oral hygiene  Community Resource Referral / Chronic Care Management: CRR required this visit?  No   CCM required this visit?  No      Plan:     I have personally reviewed and noted the following in the patient's chart:   . Medical and social history . Use of alcohol, tobacco or illicit drugs  . Current medications and supplements . Functional ability and status . Nutritional status . Physical activity . Advanced directives . List of other physicians . Hospitalizations, surgeries, and ER visits in previous 12 months . Vitals . Screenings to include cognitive, depression, and falls . Referrals and appointments  In addition, I have reviewed and discussed with patient certain preventive protocols, quality metrics, and best practice recommendations. A written personalized care plan for preventive services as well as general preventive health recommendations were provided to patient.     Barb Merinoickeah E Wille Aubuchon, LPN   16/10/960411/24/2021   Nurse Notes:

## 2020-04-12 NOTE — Patient Instructions (Signed)
Chad Miller , Thank you for taking time to come for your Medicare Wellness Visit. I appreciate your ongoing commitment to your health goals. Please review the following plan we discussed and let me know if I can assist you in the future.   Screening recommendations/referrals: Colonoscopy: not required Recommended yearly ophthalmology/optometry visit for glaucoma screening and checkup Recommended yearly dental visit for hygiene and checkup  Vaccinations: Influenza vaccine: completed 01/21/2020, due 12/18/2020 Pneumococcal vaccine: completed 01/15/2018 Tdap vaccine: completed 06/12/2011, due 06/11/2021 Shingles vaccine: completed   Covid-19: 02/15/2020, 07/27/2019, 07/02/2019  Advanced directives: Please bring a copy of your POA (Power of Attorney) and/or Living Will to your next appointment.   Conditions/risks identified: none  Next appointment: Follow up in one year for your annual wellness visit. 04/19/2021 at 10:00  Preventive Care 65 Years and Older, Male Preventive care refers to lifestyle choices and visits with your health care provider that can promote health and wellness. What does preventive care include?  A yearly physical exam. This is also called an annual well check.  Dental exams once or twice a year.  Routine eye exams. Ask your health care provider how often you should have your eyes checked.  Personal lifestyle choices, including:  Daily care of your teeth and gums.  Regular physical activity.  Eating a healthy diet.  Avoiding tobacco and drug use.  Limiting alcohol use.  Practicing safe sex.  Taking low doses of aspirin every day.  Taking vitamin and mineral supplements as recommended by your health care provider. What happens during an annual well check? The services and screenings done by your health care provider during your annual well check will depend on your age, overall health, lifestyle risk factors, and family history of disease. Counseling  Your  health care provider may ask you questions about your:  Alcohol use.  Tobacco use.  Drug use.  Emotional well-being.  Home and relationship well-being.  Sexual activity.  Eating habits.  History of falls.  Memory and ability to understand (cognition).  Work and work Astronomer. Screening  You may have the following tests or measurements:  Height, weight, and BMI.  Blood pressure.  Lipid and cholesterol levels. These may be checked every 5 years, or more frequently if you are over 40 years old.  Skin check.  Lung cancer screening. You may have this screening every year starting at age 87 if you have a 30-pack-year history of smoking and currently smoke or have quit within the past 15 years.  Fecal occult blood test (FOBT) of the stool. You may have this test every year starting at age 18.  Flexible sigmoidoscopy or colonoscopy. You may have a sigmoidoscopy every 5 years or a colonoscopy every 10 years starting at age 23.  Prostate cancer screening. Recommendations will vary depending on your family history and other risks.  Hepatitis C blood test.  Hepatitis B blood test.  Sexually transmitted disease (STD) testing.  Diabetes screening. This is done by checking your blood sugar (glucose) after you have not eaten for a while (fasting). You may have this done every 1-3 years.  Abdominal aortic aneurysm (AAA) screening. You may need this if you are a current or former smoker.  Osteoporosis. You may be screened starting at age 47 if you are at high risk. Talk with your health care provider about your test results, treatment options, and if necessary, the need for more tests. Vaccines  Your health care provider may recommend certain vaccines, such as:  Influenza vaccine.  This is recommended every year.  Tetanus, diphtheria, and acellular pertussis (Tdap, Td) vaccine. You may need a Td booster every 10 years.  Zoster vaccine. You may need this after age  40.  Pneumococcal 13-valent conjugate (PCV13) vaccine. One dose is recommended after age 73.  Pneumococcal polysaccharide (PPSV23) vaccine. One dose is recommended after age 31. Talk to your health care provider about which screenings and vaccines you need and how often you need them. This information is not intended to replace advice given to you by your health care provider. Make sure you discuss any questions you have with your health care provider. Document Released: 06/02/2015 Document Revised: 01/24/2016 Document Reviewed: 03/07/2015 Elsevier Interactive Patient Education  2017 [place] Prevention in the Home Falls can cause injuries. They can happen to people of all ages. There are many things you can do to make your home safe and to help prevent falls. What can I do on the outside of my home?  Regularly fix the edges of walkways and driveways and fix any cracks.  Remove anything that might make you trip as you walk through a door, such as a raised step or threshold.  Trim any bushes or trees on the path to your home.  Use bright outdoor lighting.  Clear any walking paths of anything that might make someone trip, such as rocks or tools.  Regularly check to see if handrails are loose or broken. Make sure that both sides of any steps have handrails.  Any raised decks and porches should have guardrails on the edges.  Have any leaves, snow, or ice cleared regularly.  Use sand or salt on walking paths during winter.  Clean up any spills in your garage right away. This includes oil or grease spills. What can I do in the bathroom?  Use night lights.  Install grab bars by the toilet and in the tub and shower. Do not use towel bars as grab bars.  Use non-skid mats or decals in the tub or shower.  If you need to sit down in the shower, use a plastic, non-slip stool.  Keep the floor dry. Clean up any water that spills on the floor as soon as it happens.  Remove  soap buildup in the tub or shower regularly.  Attach bath mats securely with double-sided non-slip rug tape.  Do not have throw rugs and other things on the floor that can make you trip. What can I do in the bedroom?  Use night lights.  Make sure that you have a light by your bed that is easy to reach.  Do not use any sheets or blankets that are too big for your bed. They should not hang down onto the floor.  Have a firm chair that has side arms. You can use this for support while you get dressed.  Do not have throw rugs and other things on the floor that can make you trip. What can I do in the kitchen?  Clean up any spills right away.  Avoid walking on wet floors.  Keep items that you use a lot in easy-to-reach places.  If you need to reach something above you, use a strong step stool that has a grab bar.  Keep electrical cords out of the way.  Do not use floor polish or wax that makes floors slippery. If you must use wax, use non-skid floor wax.  Do not have throw rugs and other things on the floor that can make  you trip. What can I do with my stairs?  Do not leave any items on the stairs.  Make sure that there are handrails on both sides of the stairs and use them. Fix handrails that are broken or loose. Make sure that handrails are as long as the stairways.  Check any carpeting to make sure that it is firmly attached to the stairs. Fix any carpet that is loose or worn.  Avoid having throw rugs at the top or bottom of the stairs. If you do have throw rugs, attach them to the floor with carpet tape.  Make sure that you have a light switch at the top of the stairs and the bottom of the stairs. If you do not have them, ask someone to add them for you. What else can I do to help prevent falls?  Wear shoes that:  Do not have high heels.  Have rubber bottoms.  Are comfortable and fit you well.  Are closed at the toe. Do not wear sandals.  If you use a  stepladder:  Make sure that it is fully opened. Do not climb a closed stepladder.  Make sure that both sides of the stepladder are locked into place.  Ask someone to hold it for you, if possible.  Clearly mark and make sure that you can see:  Any grab bars or handrails.  First and last steps.  Where the edge of each step is.  Use tools that help you move around (mobility aids) if they are needed. These include:  Canes.  Walkers.  Scooters.  Crutches.  Turn on the lights when you go into a dark area. Replace any light bulbs as soon as they burn out.  Set up your furniture so you have a clear path. Avoid moving your furniture around.  If any of your floors are uneven, fix them.  If there are any pets around you, be aware of where they are.  Review your medicines with your doctor. Some medicines can make you feel dizzy. This can increase your chance of falling. Ask your doctor what other things that you can do to help prevent falls. This information is not intended to replace advice given to you by your health care provider. Make sure you discuss any questions you have with your health care provider. Document Released: 03/02/2009 Document Revised: 10/12/2015 Document Reviewed: 06/10/2014 Elsevier Interactive Patient Education  2017 [employer].

## 2020-04-12 NOTE — Patient Instructions (Signed)

## 2020-04-12 NOTE — Progress Notes (Signed)
I,Katawbba Wiggins,acting as a Education administrator for [name], MD.,have documented all relevant documentation on the behalf of [name], MD,as directed by  [name], MD while in the presence of [name], MD.  This visit occurred during the SARS-CoV-2 public health emergency.  Safety protocols were in place, including screening questions prior to the visit, additional usage of staff PPE, and extensive cleaning of exam room while observing appropriate contact time as indicated for disinfecting solutions.  Subjective:     Patient ID: Chad Miller , male    DOB: 1940-11-15 , 79 y.o.   MRN: [id number]   Chief Complaint  Patient presents with  . Hypertension  . Hyperlipidemia    HPI  He presents today for BP and cholesterol check. He reports compliance with meds. Unfortunately, he is still having mod-severe chronic back pain. He is followed by Ortho. He has not discussed pain management with Ortho provider.   Hypertension This is a chronic problem. The current episode started more than 1 year ago. The problem has been gradually improving since onset. The problem is controlled. Pertinent negatives include no blurred vision, chest pain or shortness of breath. Risk factors for coronary artery disease include dyslipidemia and male gender. Past treatments include angiotensin blockers. Hypertensive end-organ damage includes kidney disease. Identifiable causes of hypertension include chronic renal disease.  Hyperlipidemia This is a chronic problem. The current episode started more than 1 year ago. The problem is controlled. Exacerbating diseases include chronic renal disease. Pertinent negatives include no chest pain or shortness of breath. Current antihyperlipidemic treatment includes statins. The current treatment provides moderate improvement of lipids. Compliance problems include adherence to exercise.  Risk factors for coronary artery disease include dyslipidemia, hypertension and  male sex.     Past Medical History:  Diagnosis Date  . High blood pressure   . High cholesterol   . Loss of vision    Right     Family History  Problem Relation Age of Onset  . Heart disease Mother   . Lung cancer Father   . Heart disease Brother   . Heart disease Brother   . Asthma Sister   . Allergic rhinitis Neg Hx   . Eczema Neg Hx   . Urticaria Neg Hx      Current Outpatient Medications:  .  acetaminophen (TYLENOL) 325 MG tablet, Take 650 mg by mouth every 6 (six) hours as needed., Disp: , Rfl:  .  aspirin EC 81 MG tablet, Take 81 mg by mouth daily., Disp: , Rfl:  .  atorvastatin (LIPITOR) 10 MG tablet, Take 1 tablet (10 mg total) by mouth daily., Disp: 90 tablet, Rfl: 2 .  azelastine (ASTELIN) 0.1 % nasal spray, Place 2 sprays into both nostrils 2 (two) times daily. Use in each nostril as directed (Patient not taking: Reported on 04/12/2020), Disp: 30 mL, Rfl: 5 .  co-enzyme Q-10 30 MG capsule, Take 30 mg by mouth daily., Disp: , Rfl:  .  loratadine (CLARITIN) 10 MG tablet, Take 10 mg by mouth daily., Disp: , Rfl:  .  Melatonin 5 MG CAPS, Take by mouth., Disp: , Rfl:  .  mometasone (NASONEX) 50 MCG/ACT nasal spray, Place 2 sprays into the nose daily. (Patient not taking: Reported on 04/12/2020), Disp: , Rfl:  .  Multiple Vitamin (MULTIVITAMIN) tablet, Take 1 tablet by mouth daily., Disp: , Rfl:  .  olmesartan (BENICAR) 20 MG tablet, Take 1 tablet (20 mg total) by mouth daily., Disp: 90  tablet, Rfl: 2 .  Omega 3 1000 MG CAPS, Take by mouth., Disp: , Rfl:  .  saw palmetto 500 MG capsule, Take 500 mg by mouth daily., Disp: , Rfl:  .  tadalafil (CIALIS) 5 MG tablet, Take 1 tablet (5 mg total) by mouth daily as needed for erectile dysfunction., Disp: 10 tablet, Rfl: 0 .  triamcinolone (NASACORT ALLERGY 24HR) 55 MCG/ACT AERO nasal inhaler, Place 2 sprays into the nose daily., Disp: , Rfl:  .  triamcinolone (NASACORT) 55 MCG/ACT AERO nasal inhaler, Place 2 sprays into the nose  daily. For nasal congestion, Disp: 16 g, Rfl: 5   No Known Allergies   Review of Systems  Constitutional: Negative.   Eyes: Negative for blurred vision.  Respiratory: Negative.  Negative for shortness of breath.   Cardiovascular: Negative.  Negative for chest pain.  Gastrointestinal: Negative.   Psychiatric/Behavioral: Negative.   All other systems reviewed and are negative.    Today's Vitals   04/12/20 1034  BP: (!) 142/84  Pulse: (!) 53  Temp: (!) 97.5 F (36.4 C)  TempSrc: Oral  Weight: 148 lb 3.2 oz (67.2 kg)  Height: _0  (1.753 m)   Body mass index is 21.89 kg/m.  Wt Readings from Last 3 Encounters:  04/12/20 148 lb 3.2 oz (67.2 kg)  04/12/20 148 lb 3.2 oz (67.2 kg)  01/19/20 158 lb 3.2 oz (71.8 kg)   Objective:  Physical Exam Vitals and nursing note reviewed.  Constitutional:      Appearance: Normal appearance.  HENT:     Head: Normocephalic and atraumatic.  Cardiovascular:     Rate and Rhythm: Normal rate and regular rhythm.     Heart sounds: Normal heart sounds.  Pulmonary:     Breath sounds: Normal breath sounds.  Musculoskeletal:     Cervical back: Normal range of motion.  Skin:    General: Skin is warm.  Neurological:     General: No focal deficit present.     Mental Status: He is alert and oriented to person, place, and time.         Assessment And Plan:     1. Hypertensive nephropathy Comments: Chronic, uncontrolled. States its due to concerns he needed to discuss today. He will c/w current meds for now. I will check renal function today. He will rto in four to six months for re-evaluation.  - Lipid panel - CBC no Diff - CMP14+EGFR  2. Chronic renal disease, stage II Comments: Chronic, this has been stable. He is aware that adequate hydration and optimal BP control are necessary to prevent progression of CKD.   3. Pure hypercholesterolemia Comments: Chronic, I will check lipid panel. Encouraged to avoid fried foods and increase daily  activity.  - Lipid panel  4. Abnormal weight loss Comments: He has lost 10 pounds in the past 2.5 months.Declines GI referral at this time. I will check labs as listed below.  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 Unfortunately, his sx are mod-severe. He may benefit from duloxetine.   6. Chronic pain syndrome Comments: He declined rx duloxetine. He reports doing research on the medication and he does not think he wants to try this. He is encouraged to follow up with Dr. Ernestina Patches regarding pain management.  7. Drug therapy - Vitamin B12     Patient was given opportunity to ask questions. Patient verbalized understanding of the plan and  was able to repeat key elements of the plan. All questions were answered to their satisfaction.  [name], MD   I, [name], MD, have reviewed all documentation for this visit. The documentation on 04/29/20 for the exam, diagnosis, procedures, and orders are all accurate and complete.  THE PATIENT IS ENCOURAGED TO PRACTICE SOCIAL DISTANCING DUE TO THE COVID-19 PANDEMIC.

## 2020-04-13 LAB — LIPID PANEL
Chol/HDL Ratio: 2.6 ratio (ref 0.0–5.0)
Cholesterol, Total: 143 mg/dL (ref 100–199)
HDL: 54 mg/dL (ref >39)
LDL Chol Calc (NIH): 78 mg/dL (ref 0–99)
Triglycerides: 50 mg/dL (ref 0–149)
VLDL Cholesterol Cal: 11 mg/dL (ref 5–40)

## 2020-04-13 LAB — CMP14+EGFR
ALT: 28 IU/L (ref 0–44)
AST: 26 IU/L (ref 0–40)
Albumin/Globulin Ratio: 1.3 (ref 1.2–2.2)
Albumin: 4.4 g/dL (ref 3.7–4.7)
Alkaline Phosphatase: 80 IU/L (ref 44–121)
BUN/Creatinine Ratio: 9 — ABNORMAL LOW (ref 10–24)
BUN: 9 mg/dL (ref 8–27)
Bilirubin Total: 0.8 mg/dL (ref 0.0–1.2)
CO2: 25 mmol/L (ref 20–29)
Calcium: 9.7 mg/dL (ref 8.6–10.2)
Chloride: 107 mmol/L — ABNORMAL HIGH (ref 96–106)
Creatinine, Ser: 0.95 mg/dL (ref 0.76–1.27)
GFR calc Af Amer: 88 mL/min/{1.73_m2} (ref >59)
GFR calc non Af Amer: 76 mL/min/{1.73_m2} (ref >59)
Globulin, Total: 3.4 g/dL (ref 1.5–4.5)
Glucose: 99 mg/dL (ref 65–99)
Potassium: 4.9 mmol/L (ref 3.5–5.2)
Sodium: 145 mmol/L — ABNORMAL HIGH (ref 134–144)
Total Protein: 7.8 g/dL (ref 6.0–8.5)

## 2020-04-13 LAB — CBC
Hematocrit: 47.6 % (ref 37.5–51.0)
Hemoglobin: 16.3 g/dL (ref 13.0–17.7)
MCH: 32.3 pg (ref 26.6–33.0)
MCHC: 34.2 g/dL (ref 31.5–35.7)
MCV: 94 fL (ref 79–97)
Platelets: 216 10*3/uL (ref 150–450)
RBC: 5.04 x10E6/uL (ref 4.14–5.80)
RDW: 12.8 % (ref 11.6–15.4)
WBC: 3.2 10*3/uL — ABNORMAL LOW (ref 3.4–10.8)

## 2020-04-13 LAB — PREALBUMIN: PREALBUMIN: 21 mg/dL (ref 9–32)

## 2020-04-13 LAB — VITAMIN B12: Vitamin B-12: 1411 pg/mL — ABNORMAL HIGH (ref 232–1245)

## 2020-04-13 LAB — TSH: TSH: 1.54 u[IU]/mL (ref 0.450–4.500)

## 2020-04-13 LAB — HEPATITIS C ANTIBODY: Hep C Virus Ab: <0.1 s/co ratio (ref 0.0–0.9)

## 2020-04-15 ENCOUNTER — Encounter: Payer: Self-pay | Admitting: Surgical

## 2020-04-15 NOTE — Progress Notes (Signed)
Office Visit Note   Patient: Chad Miller           Date of Birth: [birth year]           MRN: [id number] Visit Date: 04/07/2020 Requested by: [name], MD [address] [place],  [place] [address] PCP: [name], MD  Subjective: Chief Complaint  Patient presents with  . Left Leg - Pain    HPI: Chad Miller is a 79 y.o. male who presents to the office complaining of left knee pain.  Patient has history of left total knee arthroplasty in 2004.  He has been doing well in regards to his left knee pain aside from the last 3 years.  He complains of pain that travels down the leg especially around the left knee and into the dorsal foot.  He has associated numbness and tingling and low back pain with a history of "back issues".  Pain is waking him up at night.  He takes Tylenol PM and uses ice and heating pad which provides some relief but does not resolve his pain.  He denies any red flag symptoms such as bowel/bladder incontinence or saddle anesthesia.  He did see Dr. Alvester Morin for epidural steroid injections in his low back which provided 2 to 3 weeks of 50% relief from the epidural steroid injections.  He also complains of groin pain but the majority of his pain is anterior thigh pain that travels through the anterior knee and into the dorsal foot..                ROS: All systems reviewed are negative as they relate to the chief complaint within the history of present illness.  Patient denies fevers or chills.  Assessment & Plan: Visit Diagnoses:  1. Pain in left leg   2. Radicular syndrome of left leg     Plan: Patient is a 79 year old male who presents complaining of left knee pain.  He has a history of left total knee arthroplasty 2004.  He did well for that but has had increasing pain throughout the left lower extremity in the last several years.  He does have a history of back pain and had an MRI scan in October 2020 that revealed chronic severe spinal degeneration  from L2-L3 through L5-S1 with moderate to severe spinal, lateral recess, foraminal stenosis throughout.  He had right L4-L5 transforaminal epidural steroid injection that provided 50% relief for 2 to 3 weeks.  Discussed options available to patient.  Based on his response to the epidural steroid injections, it seems like the majority of his pain is coming from his lumbar spine.  Discussed living with his pain versus trying another injection versus discussing with a spine surgeon.  He wants to avoid surgery at all cost.  He wants to try another injection.  Plan refer patient to Dr. Naaman Plummer for epidural steroid injection in the lumbar spine versus intra-articular left hip injection for atypical left hip pain.    Patient's symptoms seem to be related to the severe lumbar spine changes given his MRI results and his history/clinical presentation but radiographs taken today and compared to radiographs from 14 months ago show increased valgus of the prosthesis, particularly the tibial prosthesis.  There is slight valgus alignment 14 months ago that was measured at about 2.5 degrees but most recently has been measured at 5.5 degrees.  This could represent aseptic loosening which could be contributing to his knee pain.  Plan to follow-up after epidural  steroid injection, with potential for inflammatory lab work-up and bone scan of the left knee if he has underwhelming response to the injection.  Follow-Up Instructions: No follow-ups on file.   Orders:  Orders Placed This Encounter  Procedures  . XR Knee 1-2 Views Left  . XR HIP UNILAT W OR W/O PELVIS 2-3 VIEWS LEFT  . Ambulatory referral to Physical Medicine Rehab   No orders of the defined types were placed in this encounter.     Procedures: No procedures performed   Clinical Data: No additional findings.  Objective: Vital Signs: There were no vitals taken for this visit.  Physical Exam:  Constitutional: Patient appears well-developed HEENT:   Head: Normocephalic Eyes:EOM are normal Neck: Normal range of motion Cardiovascular: Normal rate Pulmonary/chest: Effort normal Neurologic: Patient is alert Skin: Skin is warm Psychiatric: Patient has normal mood and affect  Ortho Exam: Ortho exam demonstrates well-healed incisional scar from prior total knee arthroplasty of the left knee.  No significant effusion.  No increased warmth of the left knee compared to the contralateral side.  No pain with passive range of motion of left knee.  Extensor mechanism intact.  No significant instability in flexion or extension of the left knee.  Mild to moderate pain elicited with hip range of motion, especially with external rotation internal rotation.  Negative Stinchfield exam.  Mild tenderness throughout the axial lumbar spine and paraspinal musculature on the left side.  5/5 motor strength of the bilateral hip flexors, quadricep, hamstring, dorsiflexion, plantarflexion.  No evidence of clonus.  Sensation intact through all dermatomes of bilateral lower extremities.  Specialty Comments:  No specialty comments available.  Imaging: No results found.   PMFS History: Patient Active Problem List   Diagnosis Date Noted  . OSA (obstructive sleep apnea) 08/15/2016  . 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 Snoring 07/05/2016  . Excessive daytime sleepiness 07/05/2016  . 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 Gait disturbance 04/22/2016  . Vertigo 04/22/2016  . Urinary urgency 04/22/2016  . Spinal stenosis of lumbar region 04/22/2016  . 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 High blood pressure   . High cholesterol   . Loss of vision    Right    Family History  Problem Relation Age of Onset  . Heart disease Mother   . Lung cancer Father   . Heart disease Brother   . Heart disease Brother   . Asthma Sister   . Allergic rhinitis Neg Hx   . Eczema Neg Hx   . Urticaria Neg Hx      Past Surgical History:  Procedure Laterality Date  . CATARACT EXTRACTION    . REPLACEMENT TOTAL KNEE Left 2004   Social History   Occupational History  . Occupation: Retired  Tobacco Use  . Smoking status: Never Smoker  . Smokeless tobacco: Never Used  Vaping Use  . Vaping Use: Never used  Substance and Sexual Activity  . Alcohol use: No    Comment: Quit 2015  . Drug use: No  . Sexual activity: Yes    Partners: Female    Comment: Married

## 2020-05-02 DIAGNOSIS — R351 Nocturia: Secondary | ICD-10-CM | POA: Diagnosis not present

## 2020-05-02 DIAGNOSIS — N401 Enlarged prostate with lower urinary tract symptoms: Secondary | ICD-10-CM | POA: Diagnosis not present

## 2020-05-05 ENCOUNTER — Telehealth: Payer: Self-pay

## 2020-05-05 DIAGNOSIS — H52202 Unspecified astigmatism, left eye: Secondary | ICD-10-CM | POA: Diagnosis not present

## 2020-05-05 DIAGNOSIS — H1820 Unspecified corneal edema: Secondary | ICD-10-CM | POA: Diagnosis not present

## 2020-05-05 DIAGNOSIS — H25812 Combined forms of age-related cataract, left eye: Secondary | ICD-10-CM | POA: Diagnosis not present

## 2020-05-05 NOTE — Telephone Encounter (Signed)
Patient agreed to come in 05/24/19 for BPC.

## 2020-05-09 ENCOUNTER — Ambulatory Visit: Payer: Self-pay

## 2020-05-09 ENCOUNTER — Encounter: Payer: Self-pay | Admitting: Physical Medicine and Rehabilitation

## 2020-05-09 ENCOUNTER — Encounter: Payer: Self-pay | Admitting: Internal Medicine

## 2020-05-09 ENCOUNTER — Ambulatory Visit (INDEPENDENT_AMBULATORY_CARE_PROVIDER_SITE_OTHER): Payer: Medicare Other | Admitting: Physical Medicine and Rehabilitation

## 2020-05-09 ENCOUNTER — Other Ambulatory Visit: Payer: Self-pay

## 2020-05-09 DIAGNOSIS — M25552 Pain in left hip: Secondary | ICD-10-CM

## 2020-05-09 DIAGNOSIS — M25551 Pain in right hip: Secondary | ICD-10-CM

## 2020-05-09 NOTE — Progress Notes (Signed)
Chad Miller - 79 y.o. male MRN [id number]  Date of birth: 1940-12-07  Office Visit Note: Visit Date: 05/09/2020 PCP: [name], MD Referred by: [name], MD  Subjective: Chief Complaint  Patient presents with  . Right Hip - Pain   HPI:  Chad Miller is a 79 y.o. male who comes in today at the request of Karenann Cai, PA-C for planned Left anesthetic hip arthrogram with fluoroscopic guidance.  The patient has failed conservative care including home exercise, medications, time and activity modification.  This injection will be diagnostic and hopefully therapeutic.  Please see requesting physician notes for further details and justification.  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  He reports right hip hurts just as bad as his left hip.  We will complete bilateral hip injections today.  ROS Otherwise per HPI.  Assessment & Plan: Visit Diagnoses: No diagnosis found.  Plan: No additional findings.   Meds & Orders: No orders of the defined types were placed in this encounter.  No orders of the defined types were placed in this encounter.   Follow-up: No follow-ups on file.   Procedures: Large Joint Inj: bilateral hip joint on 05/09/2020 2:25 PM Indications: diagnostic evaluation and pain Details: 22 G 3.5 in needle, fluoroscopy-guided anterior approach  Arthrogram: No  Medications (Right): 60 mg triamcinolone acetonide 40 MG/ML; 4 mL bupivacaine 0.25 % Medications (Left): 60 mg triamcinolone acetonide 40 MG/ML; 4 mL bupivacaine 0.25 % Outcome: tolerated well, no immediate complications  There was excellent flow of contrast producing a partial arthrogram of the hip. The patient did have relief of symptoms during the anesthetic phase of the injection. Procedure, treatment alternatives, risks and benefits explained, specific risks discussed. Consent was given by the patient.  Immediately prior to procedure a time out was called to verify the correct patient, procedure, equipment, support staff and site/side marked as required. Patient was prepped and draped in the usual sterile fashion.          Clinical History: MRI LUMBAR SPINE WITHOUT CONTRAST  TECHNIQUE: Multiplanar, multisequence MR imaging of the lumbar spine was performed. No intravenous contrast was administered.  COMPARISON:  Lumbar MRI 07/13/2016. CT Abdomen and Pelvis 01/17/2012.  FINDINGS: Segmentation: Normal, vestigial L5-S1 disc space. This is the same numbering system used on the 07/13/2016 MRI.  Alignment: Chronic straightening and mild reversal of lumbar lordosis with mostly levoconvex lumbar scoliosis. Chronic retrolisthesis of L1 on L2 measures up to 6 millimeters but appears stable since 2018.  Vertebrae: Chronic severe endplate degeneration with associated marrow signal changes L2-L3 through L5-S1. Background bone marrow signal remains normal. No acute or suspicious marrow lesion identified. Intact visible sacrum and SI joints.  Conus medullaris and cauda equina: Conus extends to the L1 level. No lower spinal cord or conus signal abnormality.  Paraspinal and other soft tissues: Negative.  Disc levels:  T11-T12: Borderline to mild spinal stenosis related to disc bulging and moderate posterior element hypertrophy. Superimposed severe left T11 foraminal stenosis which may be in part related to a left foraminal disc protrusion. Moderate to severe right T11 foraminal stenosis. These are stable since 2018.  T12-L1: Left eccentric disc bulge and posterior element hypertrophy. No significant spinal stenosis. Mild to moderate T12 foraminal stenosis. This level is stable.  L1-L2: Chronic retrolisthesis and circumferential disc bulge eccentric to the left. Moderate posterior element hypertrophy with new trace degenerative facet joint fluid. Mild spinal  stenosis  and mild-to-moderate right greater than left L1 foraminal stenosis. This level is stable.  L2-L3: Severe chronic disc space loss with bulky disc osteophyte complex and moderate posterior element hypertrophy. Moderate to severe spinal and right lateral recess stenosis. Severe right L2 foraminal stenosis. This level is stable.  L3-L4: Severe chronic disc space loss with bulky circumferential disc osteophyte complex and posterior element hypertrophy. Epidural fat contributes to moderate to severe spinal stenosis with moderate left and severe right L3 foraminal stenosis. This level is stable.  L4-L5: Severe chronic disc space loss with bulky circumferential disc osteophyte complex and posterior element hypertrophy. Severe spinal stenosis and left greater than right lateral recess stenosis. Severe left and mild right L4 foraminal stenosis. This level is stable.  L5-S1: Severe chronic disc space loss with bulky circumferential disc osteophyte complex and posterior element hypertrophy. Moderate to severe spinal and bilateral lateral recess stenosis. Moderate to severe L5 foraminal stenosis perhaps greater on the right. This level is stable.  S1-S2: Vestigial disc, otherwise negative.  IMPRESSION: 1. No acute findings in the lumbar spine. Degenerative marrow signal changes with no suspicious osseous lesion identified. 2. 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 3. 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 4. Stable mild lower thoracic spinal stenosis and moderate to severe foraminal stenosis at T11-T12.   Electronically Signed   By: Odessa Fleming M.D.   On: 02/28/2019 22:04     Objective:  VS:  HT:    WT:   BMI:     BP:   HR: bpm  TEMP: ( )  RESP:  Physical Exam   Imaging: No results  found.

## 2020-05-09 NOTE — Progress Notes (Signed)
Pt state lower right hip pain. Pt state right hurts more then his left. Pt state get up out of bed in the morning he feels pains. Pt state excises and pain meds to help ease the pain.  Numeric Pain Rating Scale and Functional Assessment Average Pain 5   In the last MONTH (on 0-10 scale) has pain interfered with the following?  1. General activity like being  able to carry out your everyday physical activities such as walking, climbing stairs, carrying groceries, or moving a chair?  Rating(6)

## 2020-05-22 MED ORDER — TRIAMCINOLONE ACETONIDE 40 MG/ML IJ SUSP
60.0000 mg | INTRAMUSCULAR | Status: AC | PRN
  Administered 2020-05-09: 60 mg via INTRA_ARTICULAR

## 2020-05-22 MED ORDER — BUPIVACAINE HCL 0.25 % IJ SOLN
4.0000 mL | INTRAMUSCULAR | Status: AC | PRN
  Administered 2020-05-09: 4 mL via INTRA_ARTICULAR

## 2020-05-23 ENCOUNTER — Other Ambulatory Visit: Payer: Self-pay

## 2020-05-23 ENCOUNTER — Ambulatory Visit: Payer: Medicare Other

## 2020-05-23 VITALS — BP 138/80 | HR 81 | Temp 97.6°F | Ht 69.0 in | Wt 153.4 lb

## 2020-05-23 DIAGNOSIS — I129 Hypertensive chronic kidney disease with stage 1 through stage 4 chronic kidney disease, or unspecified chronic kidney disease: Secondary | ICD-10-CM

## 2020-05-23 NOTE — Progress Notes (Signed)
Patient is here for blood pressure check.   BP Readings from Last 3 Encounters:  05/23/20 138/80  04/12/20 (!) 142/84  04/12/20 (!) 142/84   Patient is almost at goal 130/80. Patient agrees to exercise more on his treadmill and bicycle.

## 2020-06-26 ENCOUNTER — Other Ambulatory Visit: Payer: Self-pay | Admitting: Internal Medicine

## 2020-06-26 ENCOUNTER — Telehealth: Payer: Self-pay

## 2020-06-26 DIAGNOSIS — I129 Hypertensive chronic kidney disease with stage 1 through stage 4 chronic kidney disease, or unspecified chronic kidney disease: Secondary | ICD-10-CM

## 2020-06-26 DIAGNOSIS — E7800 Pure hypercholesterolemia, unspecified: Secondary | ICD-10-CM

## 2020-06-26 NOTE — Telephone Encounter (Signed)
Pt called asking for a referral to see Dr. Jacinto Halim. Per RS call pt and ask him why? Per pt he has high blood pressure and at this time in his life he would like to see a heart doctor to make sure things are going good. Pt stated he seen him in the past several years ago.  He also stated if there is someone else you recommend him see than [facility] he is ok with that!

## 2020-07-03 ENCOUNTER — Encounter: Payer: Self-pay | Admitting: Internal Medicine

## 2020-07-20 ENCOUNTER — Ambulatory Visit (INDEPENDENT_AMBULATORY_CARE_PROVIDER_SITE_OTHER): Payer: Medicare Other | Admitting: Allergy

## 2020-07-20 ENCOUNTER — Other Ambulatory Visit: Payer: Self-pay

## 2020-07-20 ENCOUNTER — Encounter: Payer: Self-pay | Admitting: Allergy

## 2020-07-20 VITALS — BP 142/98 | HR 61 | Temp 98.6°F | Resp 18 | Wt 158.6 lb

## 2020-07-20 DIAGNOSIS — J3089 Other allergic rhinitis: Secondary | ICD-10-CM

## 2020-07-20 DIAGNOSIS — R42 Dizziness and giddiness: Secondary | ICD-10-CM

## 2020-07-20 NOTE — Progress Notes (Signed)
Follow-up Note  RE: Chad Miller MRN: [id number] DOB: 1940-12-19 Date of Office Visit: 07/20/2020   History of present illness: Chad Miller is a 80 y.o. male presenting today for follow-up of allergic rhinitis.  He also has issues with vertigo.  He was last seen in the office on 01/19/2020 by myself.  He states he continues to have vertigo. He states it is not the point of falling or being imbalanced but it is bothersome.   He does feel some relief with nasal saline use as he does note that he will produce some mucus after use of the saline that he can then blow out.  He is not sure if the loratadine has provide any benefit. He states he did see a neurologist about a year ago and had testing done that he reports did not reveal a cause of the vertigo.  He does report that changes in position can elicit vertigo.     Review of systems: Review of Systems  Constitutional: Negative.   HENT: Negative.   Eyes: Negative.   Respiratory: Negative.   Cardiovascular: Negative.   Gastrointestinal: Negative.   Musculoskeletal: Negative.   Skin: Negative.   Neurological: Positive for dizziness.    All other systems negative unless noted above in HPI  Past medical/social/surgical/family history have been reviewed and are unchanged unless specifically indicated below.  No changes  Medication List: Current Outpatient Medications  Medication Sig Dispense Refill  . aspirin EC 81 MG tablet Take 81 mg by mouth daily.    Marland Kitchen atorvastatin (LIPITOR) 10 MG tablet Take 1 tablet (10 mg total) by mouth daily. 90 tablet 2  . azelastine (ASTELIN) 0.1 % nasal spray Place 2 sprays into both nostrils 2 (two) times daily. Use in each nostril as directed 30 mL 5  . co-enzyme Q-10 30 MG capsule Take 30 mg by mouth daily.    Marland Kitchen loratadine (CLARITIN) 10 MG tablet Take 10 mg by mouth daily.    . Melatonin 5 MG CAPS Take by mouth.    . mometasone (NASONEX) 50 MCG/ACT nasal spray Place 2 sprays into the nose  daily.    . Multiple Vitamin (MULTIVITAMIN) tablet Take 1 tablet by mouth daily.    Marland Kitchen olmesartan (BENICAR) 20 MG tablet Take 1 tablet (20 mg total) by mouth daily. 90 tablet 2  . Omega 3 1000 MG CAPS Take by mouth.    . tadalafil (CIALIS) 5 MG tablet Take 1 tablet (5 mg total) by mouth daily as needed for erectile dysfunction. 10 tablet 0  . triamcinolone (NASACORT) 55 MCG/ACT AERO nasal inhaler Place 2 sprays into the nose daily.     No current facility-administered medications for this visit.     Known medication allergies: No Known Allergies   Physical examination: Blood pressure (!) 142/98, pulse 61, temperature 98.6 F (37 C), temperature source Temporal, resp. rate 18, weight 158 lb 9.6 oz (71.9 kg), SpO2 96 %.  General: Alert, interactive, in no acute distress. HEENT: PERRLA, right eye cloudy, TMs pearly gray, turbinates non-edematous without discharge, post-pharynx non erythematous. Neck: Supple without lymphadenopathy. Lungs: Clear to auscultation without wheezing, rhonchi or rales. {no increased work of breathing. CV: Normal S1, S2 without murmurs. Abdomen: Nondistended, nontender. Skin: Warm and dry, without lesions or rashes. Extremities:  No clubbing, cyanosis or edema. Neuro:   Grossly intact.  Diagnositics/Labs: None today   Assessment and plan:   Allergic rhinitis   - continue avoidance measures for grasses, dust mites  -  change Loratadine to Ceterizine (Zyrtec) 10 mg daily  - use nasal steroid spray like mometasone, fluticasone or triamcinolone nasal spray 2 sprays each nostril daily.  Use for 1 to 2 weeks at a time before stopping once symptoms improve. Nasal steroid sprays help with sinus congestion  - if having nasal drainage/postnasal drip use nasal antihistamine, Azelastin, 2 sprays each nostril up to twice a day  - use nasal saline rinse or nasal saline spray to help keep nose moisturized and flushed out  Vertigo  - can be due to positional changes or  shifts in otoliths in the inner ear  - will place ENT referral for vertigo evaluation  Follow-up 4-6 months or sooner if needed    I appreciate the opportunity to take part in Chad Miller's care. Please do not hesitate to contact me with questions.  Sincerely,   Margo Aye, MD Allergy/Immunology [facility]

## 2020-07-20 NOTE — Patient Instructions (Signed)
Allergic rhinitis   - continue avoidance measures for grasses, dust mites  - change Loratadine to Ceterizine (Zyrtec) 10 mg daily  - use nasal steroid spray like mometasone, fluticasone or triamcinolone nasal spray 2 sprays each nostril daily.  Use for 1 to 2 weeks at a time before stopping once symptoms improve. Nasal steroid sprays help with sinus congestion  - if having nasal drainage/postnasal drip use nasal antihistamine, Azelastin, 2 sprays each nostril up to twice a day  - use nasal saline rinse or nasal saline spray to help keep nose moisturized and flushed out  Vertigo  - can be due to positional changes or shifts in otoliths in the inner ear  - will place ENT referral for vertigo evaluation  Follow-up 4-6 months or sooner if needed

## 2020-07-24 ENCOUNTER — Telehealth: Payer: Self-pay

## 2020-07-24 NOTE — Telephone Encounter (Signed)
Referral has been placed to Dr Avel Sensor office for review and scheduling.  Patient has been informed & he states he will call their office tomorrow to get scheduled.  Thanks

## 2020-07-24 NOTE — Telephone Encounter (Signed)
-----   Message from Ma Hillock, [place] sent at 07/20/2020 12:11 PM EST ----- Regarding: Referal to ENT Patient has been referred to Dr. Suszanne Conners from Dr. Delorse Lek for evaluation of Vertigo.

## 2020-08-14 ENCOUNTER — Ambulatory Visit: Payer: Medicare Other | Admitting: Cardiology

## 2020-08-14 ENCOUNTER — Other Ambulatory Visit: Payer: Self-pay

## 2020-08-14 ENCOUNTER — Encounter: Payer: Self-pay | Admitting: Cardiology

## 2020-08-14 VITALS — BP 142/70 | HR 50 | Temp 98.2°F | Resp 17 | Ht 70.0 in | Wt 151.0 lb

## 2020-08-14 DIAGNOSIS — I493 Ventricular premature depolarization: Secondary | ICD-10-CM | POA: Diagnosis not present

## 2020-08-14 DIAGNOSIS — R0609 Other forms of dyspnea: Secondary | ICD-10-CM | POA: Diagnosis not present

## 2020-08-14 DIAGNOSIS — R0600 Dyspnea, unspecified: Secondary | ICD-10-CM

## 2020-08-14 DIAGNOSIS — I739 Peripheral vascular disease, unspecified: Secondary | ICD-10-CM | POA: Diagnosis not present

## 2020-08-14 DIAGNOSIS — I517 Cardiomegaly: Secondary | ICD-10-CM | POA: Diagnosis not present

## 2020-08-14 DIAGNOSIS — I10 Essential (primary) hypertension: Secondary | ICD-10-CM | POA: Diagnosis not present

## 2020-08-14 DIAGNOSIS — I129 Hypertensive chronic kidney disease with stage 1 through stage 4 chronic kidney disease, or unspecified chronic kidney disease: Secondary | ICD-10-CM | POA: Diagnosis not present

## 2020-08-14 MED ORDER — AMLODIPINE BESYLATE 5 MG PO TABS: 5.0000 mg | ORAL_TABLET | Freq: Every day | ORAL | 3 refills | Status: DC

## 2020-08-14 NOTE — Progress Notes (Signed)
Primary Physician/Referring:  Dorothyann PengSanders, Robyn, MD  Patient ID: Chad Miller, male    DOB: 07/29/1940, 80 y.o.   MRN: [id number]  Chief Complaint  Patient presents with  . Hypertension  . Hyperlipidemia  . New Patient (Initial Visit)    Referred by Dr. Allyne GeeSanders   HPI:    Chad Miller  is a 80 y.o. AA male with history of hypertension, hyperlipidemia, and chronic pain syndrome managed by Dr. Alvester MorinNewton.  Patient was seen in our office by Dr. Jacinto HalimGanji in the past, last visit appears to be in 2014 for episodes of dizziness that were felt to be related to antihypertensive medications and he was advised for as needed follow-up.   No history of diabetes mellitus, chronic kidney disease, coronary artery disease, MI, CVA or TIA.  He is now referred back to our office by PCP, Dr. Allyne GeeSanders, at patient's request for further evaluation and management of hypertension and hyperlipidemia.  Patient reports that for last few months he has noticed his home blood pressure readings slowly increasing to 140 mmHg systolic.  He does admit to relatively high salt intake in his diet.  He continues to have chronic back and left knee pain, he follows with orthopedic and pain management.  He also is concerned that over the last several months he has noticed increased dyspnea on exertion, particularly goes up the steps.  He also reports leg pain and feet pain when he goes up the stairs.  Past Medical History:  Diagnosis Date  . High blood pressure   . High cholesterol   . Loss of vision    Right   Past Surgical History:  Procedure Laterality Date  . CATARACT EXTRACTION    . REPLACEMENT TOTAL KNEE Left 2004   Family History  Problem Relation Age of Onset  . Heart disease Mother   . Lung cancer Father   . Heart disease Brother   . Heart disease Brother   . Asthma Sister   . Allergic rhinitis Neg Hx   . Eczema Neg Hx   . Urticaria Neg Hx     Social History   Tobacco Use  . Smoking status: Never Smoker  .  Smokeless tobacco: Never Used  Substance Use Topics  . Alcohol use: No    Comment: Quit 2015   Marital Status: Married  ROS  Review of Systems  Constitutional: Negative for malaise/fatigue and weight gain.  Cardiovascular: Positive for dyspnea on exertion. Negative for chest pain, claudication, near-syncope, orthopnea, palpitations, paroxysmal nocturnal dyspnea and syncope.  Hematologic/Lymphatic: Does not bruise/bleed easily.  Gastrointestinal: Negative for melena.  Neurological: Negative for dizziness and weakness.   Objective  Blood pressure (!) 142/70, pulse (!) 50, temperature 98.2 F (36.8 C), resp. rate 17, height 5\' 10"  (1.778 m), weight 151 lb (68.5 kg), SpO2 97 %.  Vitals with BMI 08/14/2020 07/20/2020 05/23/2020  Height 5\' 10"  - 5\' 9"   Weight 151 lbs 158 lbs 10 oz 153 lbs 6 oz  BMI 21.67 - 22.64  Systolic 142 142 409138  Diastolic 70 98 80  Pulse 50 61 81     Physical Exam Vitals reviewed.  HENT:     Head: Normocephalic and atraumatic.  Cardiovascular:     Rate and Rhythm: Normal rate and regular rhythm. Frequent extrasystoles are present.    Pulses: Intact distal pulses.          Carotid pulses are 2+ on the right side and 2+ on the left side.  Radial pulses are 2+ on the right side and 2+ on the left side.       Femoral pulses are 2+ on the right side and 2+ on the left side.      Popliteal pulses are 2+ on the right side and 1+ on the left side.       Dorsalis pedis pulses are 0 on the right side and 0 on the left side.       Posterior tibial pulses are 1+ on the right side and 1+ on the left side.     Heart sounds: S1 normal and S2 normal. No murmur heard. No gallop.      Comments: Patient presently in ventricular bigeminy.  Pulmonary:     Effort: Pulmonary effort is normal. No respiratory distress.     Breath sounds: No wheezing, rhonchi or rales.  Musculoskeletal:     Right lower leg: No edema.     Left lower leg: No edema.  Skin:    General: Skin is warm  and dry.  Neurological:     Mental Status: He is alert.    Laboratory examination:   Recent Labs    10/04/19 0846 04/12/20 1156  NA 139 145*  K 4.0 4.9  CL 103 107*  CO2 22 25  GLUCOSE 90 99  BUN 17 9  CREATININE 1.02 0.95  CALCIUM 9.5 9.7  GFRNONAA 70 76  GFRAA 80 88   CrCl cannot be calculated (Patient's most recent lab result is older than the maximum 21 days allowed.).  CMP Latest Ref Rng & Units 04/12/2020 10/04/2019 04/01/2019  Glucose 65 - 99 mg/dL 99 90 93  BUN 8 - 27 mg/dL 9 17 10   Creatinine 0.76 - 1.27 mg/dL 7.12 4.58  Sodium 134 - 144 mmol/L 145(H) 139 141  Potassium 3.5 - 5.2 mmol/L 4.9 4.0 4.5  Chloride 96 - 106 mmol/L 107(H) 103 103  CO2 20 - 29 mmol/L 25 22 25   Calcium 8.6 - 10.2 mg/dL 9.7 9.5 9.4  Total Protein 6.0 - 8.5 g/dL 7.8 7.7 7.5  Total Bilirubin 0.0 - 1.2 mg/dL 0.8 0.5 1.1  Alkaline Phos 44 - 121 IU/L 80 82 78  AST 0 - 40 IU/L 26 21 24   ALT 0 - 44 IU/L 28 19 20    CBC Latest Ref Rng & Units 04/12/2020 04/01/2019 11/02/2008  WBC 3.4 - 10.8 x10E3/uL 3.2(L) 4.1 -  Hemoglobin 13.0 - 17.7 g/dL 04/14/2020 13/04/2019  Hematocrit 37.5 - 51.0 % 47.6 42.4 44.0  Platelets 150 - 450 x10E3/uL 216 211 -    Lipid Panel Recent Labs    04/12/20 1156  CHOL 143  TRIG 50  LDLCALC 78  HDL 54  CHOLHDL 2.6   Lipid Panel     Component Value Date/Time   CHOL 143 04/12/2020 1156   TRIG 50 04/12/2020 1156   HDL 54 04/12/2020 1156   CHOLHDL 2.6 04/12/2020 1156   LDLCALC 78 04/12/2020 1156   LABVLDL 11 04/12/2020 1156     HEMOGLOBIN A1C Lab Results  Component Value Date   HGBA1C 5.4 04/01/2019   TSH Recent Labs    04/12/20 1156  TSH 1.540    External labs:   None   Medications and allergies  No Known Allergies   Outpatient Medications Prior to Visit  Medication Sig  . aspirin EC 81 MG tablet Take 81 mg by mouth daily.  04/14/2020 atorvastatin (LIPITOR) 10 MG tablet Take 1 tablet (10 mg total) by  mouth daily.  Marland Kitchen azelastine (ASTELIN) 0.1 % nasal  spray Place 2 sprays into both nostrils 2 (two) times daily. Use in each nostril as directed  . co-enzyme Q-10 30 MG capsule Take 30 mg by mouth daily.  Marland Kitchen loratadine (CLARITIN) 10 MG tablet Take 10 mg by mouth daily.  . Melatonin 5 MG CAPS Take by mouth.  . mometasone (NASONEX) 50 MCG/ACT nasal spray Place 2 sprays into the nose daily.  . Multiple Vitamin (MULTIVITAMIN) tablet Take 1 tablet by mouth daily.  Marland Kitchen olmesartan (BENICAR) 20 MG tablet Take 1 tablet (20 mg total) by mouth daily.  . Omega 3 1000 MG CAPS Take by mouth.  . tadalafil (CIALIS) 5 MG tablet Take 1 tablet (5 mg total) by mouth daily as needed for erectile dysfunction.  . triamcinolone (NASACORT) 55 MCG/ACT AERO nasal inhaler Place 2 sprays into the nose daily.   No facility-administered medications prior to visit.    Radiology:   No results found.   Cardiac Studies:   Echocardiogram 11/28/2011:  Normal LVEF.  Atrial septal aneurysm, small PFO.  EKG:   EKG 08/14/2020: Sinus rhythm with frequent PVCs in bigeminal pattern at a rate of 79 bpm.  Left atrial enlargement.  Normal axis.  Incomplete right bundle branch block.  IVCD. Low voltage complexes.   EKG 05/21/2012: Normal sinus rhythm at a rate of 56 bpm.  Incomplete right bundle branch block.  Otherwise normal.  No evidence of ischemia or injury pattern.  Assessment     ICD-10-CM   1. Primary hypertension  I10 EKG 12-Lead    amLODipine (NORVASC) 5 MG tablet  2. Dyspnea on exertion  R06.00 DG Chest 2 View    ECHOCARDIOGRAM COMPLETE    CANCELED: PCV ECHOCARDIOGRAM COMPLETE  3. Hypertensive nephropathy  I12.9 EKG 12-Lead  4. PVC (premature ventricular contraction)  I49.3 ECHOCARDIOGRAM COMPLETE    CANCELED: PCV ECHOCARDIOGRAM COMPLETE  5. Left atrial enlargement  I51.7 ECHOCARDIOGRAM COMPLETE    CANCELED: PCV ECHOCARDIOGRAM COMPLETE  6. Mild claudication (HCC)  I73.9 PCV ANKLE BRACHIAL INDEX (ABI)     There are no discontinued medications.  Meds ordered this  encounter  Medications  . amLODipine (NORVASC) 5 MG tablet    Sig: Take 1 tablet (5 mg total) by mouth daily.    Dispense:  30 tablet    Refill:  3   Orders Placed This Encounter  Procedures  . DG Chest 2 View    Standing Status:   Future    Standing Expiration Date:   10/14/2020    Order Specific Question:   Reason for Exam (SYMPTOM  OR DIAGNOSIS REQUIRED)    Answer:   DOE    Order Specific Question:   Preferred imaging location?    Answer:   [facility]    Order Specific Question:   Radiology Contrast Protocol - do NOT remove file path    Answer:   [email or web address]  . EKG 12-Lead  . ECHOCARDIOGRAM COMPLETE    With strain    Standing Status:   Future    Standing Expiration Date:   08/14/2021    Order Specific Question:   Where should this test be performed    Answer:       Order Specific Question:   Perflutren DEFINITY (image enhancing agent) should be administered unless hypersensitivity or allergy exist    Answer:   Administer Perflutren    Order Specific Question:   Reason for exam-Echo    Answer:  Other-Full Diagnosis List    Order Specific Question:   Full ICD-10/Reason for Exam    Answer:   PVC (premature ventricular contraction) [295284]    Recommendations:   Chad Miller is a 80 y.o. AA male with history of hypertension, hyperlipidemia, and chronic pain syndrome managed by Dr. Alvester Morin.  Patient was seen in our office by Dr. Jacinto Halim in the past, last visit appears to be in 2014.  He is now referred back to our office by PCP, Dr. Allyne Gee, at patient's request for further evaluation and management of hypertension and hyperlipidemia.  Patient's primary concerns today are dyspnea on exertion and elevated blood pressure over the last several months.  Patient's blood pressure was initially significantly elevated in the office, upon recheck it improved but remained above goal at 140/70 mmHg.  We will add  amlodipine 5 mg daily in the evenings.  Advised him to continue Benicar 20 mg daily in the morning.  I personally reviewed external labs, lipids are under excellent control, continue atorvastatin.  In regard to patient's symptoms of dyspnea on exertion as well as EKG demonstrating low voltage complexes, which may be concerning for infiltrative cardiac disease, will obtain echocardiogram history.  We will also obtain chest x-ray.  On exam patient has reduced pedal pulses bilaterally, he also reports symptoms of claudication, particularly with going up stairs.  Although it is difficult to differentiate symptoms may be consistent with pseudoclaudication.  Will obtain ABI to further investigate underlying etiology.  Follow-up in 6 weeks, sooner if needed, for results of cardiac testing.  Patient was seen in collaboration with Dr. Jacinto Halim. He also reviewed patient's chart and examined the patient. Dr. Jacinto Halim  is in agreement of the plan.    Chad Halsted, PA-C 08/14/2020, 1:17 PM Office: [phone]

## 2020-08-16 ENCOUNTER — Ambulatory Visit (HOSPITAL_COMMUNITY)
Admission: RE | Admit: 2020-08-16 | Discharge: 2020-08-16 | Disposition: A | Discharge status: Home / Self Care | Payer: Medicare Other | Source: Ambulatory Visit | Attending: Student | Admitting: Student

## 2020-08-16 ENCOUNTER — Ambulatory Visit
Admission: RE | Admit: 2020-08-16 | Discharge: 2020-08-16 | Disposition: A | Discharge status: Home / Self Care | Payer: Medicare Other | Source: Ambulatory Visit | Attending: Student | Admitting: Student

## 2020-08-16 ENCOUNTER — Other Ambulatory Visit: Payer: Self-pay

## 2020-08-16 DIAGNOSIS — I517 Cardiomegaly: Secondary | ICD-10-CM | POA: Diagnosis not present

## 2020-08-16 DIAGNOSIS — R0600 Dyspnea, unspecified: Secondary | ICD-10-CM | POA: Diagnosis not present

## 2020-08-16 DIAGNOSIS — I493 Ventricular premature depolarization: Secondary | ICD-10-CM

## 2020-08-16 DIAGNOSIS — R0609 Other forms of dyspnea: Secondary | ICD-10-CM

## 2020-08-16 NOTE — Progress Notes (Signed)
  Echocardiogram 2D Echocardiogram has been performed.  Delcie Roch 08/16/2020, 9:57 AM

## 2020-08-17 NOTE — Progress Notes (Signed)
Called and left voicemail for patient to call the office back to discuss results.

## 2020-08-17 NOTE — Progress Notes (Signed)
Reviewed and discussed results with patient. He had injury to his ribs several years ago, this is old fracture.

## 2020-08-18 DIAGNOSIS — Z23 Encounter for immunization: Secondary | ICD-10-CM | POA: Diagnosis not present

## 2020-08-23 ENCOUNTER — Ambulatory Visit: Payer: Medicare Other

## 2020-08-23 ENCOUNTER — Other Ambulatory Visit: Payer: Self-pay

## 2020-08-23 DIAGNOSIS — I739 Peripheral vascular disease, unspecified: Secondary | ICD-10-CM

## 2020-09-08 NOTE — Telephone Encounter (Signed)
Patient is scheduled for an appointment on 09/19/2020 @ 1:30 per fax request we received from Dr Avel Sensor office.

## 2020-09-21 DIAGNOSIS — H838X3 Other specified diseases of inner ear, bilateral: Secondary | ICD-10-CM | POA: Diagnosis not present

## 2020-09-21 DIAGNOSIS — R42 Dizziness and giddiness: Secondary | ICD-10-CM | POA: Diagnosis not present

## 2020-09-21 DIAGNOSIS — H8112 Benign paroxysmal vertigo, left ear: Secondary | ICD-10-CM | POA: Diagnosis not present

## 2020-09-21 DIAGNOSIS — H903 Sensorineural hearing loss, bilateral: Secondary | ICD-10-CM | POA: Diagnosis not present

## 2020-09-21 NOTE — Progress Notes (Signed)
Primary Physician/Referring:  [name], MD  Patient ID: Chad Miller, male    DOB: Aug 05, 1940, 80 y.o.   MRN: [id number]  Chief Complaint  Patient presents with  . Hypertension  . Results  . Follow-up   HPI:    Chad Miller  is a 80 y.o. AA male with history of hypertension, hyperlipidemia, and chronic pain syndrome managed by Dr. Alvester Morin.  Patient was seen in our office by Dr. Jacinto Halim in 2014 for dizziness.  He was then referred back to our office by PCP for further evaluation and management of hypertension and hyperlipidemia.   No history of diabetes mellitus, chronic kidney disease, coronary artery disease, MI, CVA or TIA.  Patient now presents for 6-week follow-up and results of cardiac testing.  At last visit added amlodipine 5 mg daily, ordered echocardiogram and chest x-ray, as well as ABI.  Unfortunately results of echocardiogram were not available for review at today's visit, however chest x-ray was without abnormality and ABI did reveal mildly reduced perfusion of right and left lower extremities with abnormal waveforms suggestive of severely diseased right AT.   Patient admitts to feelings of high anxiety in regard to blood pressure management, therefore he does not monitor his blood pressure at home on a regular basis.  He also suffers from severe chronic back pain.  At last visit patient discussed pain when walking up the stairs, however upon further discussion of the symptoms at today's visit patient reports pain is primarily concentrated in his left knee, as well as pain in his toes which is constant not just with walking.  Patient denies symptoms of claudication, rather symptoms suggestive of arthritis and/or neuropathy.  She does continue to have mild dyspnea on exertion particularly with going up the stairs.    Past Medical History:  Diagnosis Date  . High blood pressure   . High cholesterol   . Loss of vision    Right   Past Surgical History:  Procedure  Laterality Date  . CATARACT EXTRACTION    . REPLACEMENT TOTAL KNEE Left 2004   Family History  Problem Relation Age of Onset  . Heart disease Mother   . Lung cancer Father   . Heart disease Brother   . Heart disease Brother   . Asthma Sister   . Allergic rhinitis Neg Hx   . Eczema Neg Hx   . Urticaria Neg Hx     Social History   Tobacco Use  . Smoking status: Never Smoker  . Smokeless tobacco: Never Used  Substance Use Topics  . Alcohol use: No    Comment: Quit 2015   Marital Status: Married  ROS  Review of Systems  Constitutional: Negative for malaise/fatigue and weight gain.  Cardiovascular: Positive for dyspnea on exertion. Negative for chest pain, claudication, near-syncope, orthopnea, palpitations, paroxysmal nocturnal dyspnea and syncope.  Hematologic/Lymphatic: Does not bruise/bleed easily.  Musculoskeletal: Positive for arthritis and back pain.  Gastrointestinal: Negative for melena.  Neurological: Negative for dizziness and weakness.   Objective  Blood pressure (!) 156/82, pulse 77, temperature 97.6 F (36.4 C), height 5\' 10"  (1.778 m), weight 158 lb (71.7 kg), SpO2 98 %.  Vitals with BMI 09/25/2020 08/14/2020 07/20/2020  Height 5\' 10"  5\' 10"  -  Weight 158 lbs 151 lbs 158 lbs 10 oz  BMI 22.67 21.67 -  Systolic 156 142 09/19/2020  Diastolic 82 70 98  Pulse 77 50 61     Physical Exam Vitals reviewed.  HENT:  Head: Normocephalic and atraumatic.  Cardiovascular:     Rate and Rhythm: Normal rate and regular rhythm. Frequent extrasystoles are present.    Pulses: Intact distal pulses.          Carotid pulses are 2+ on the right side and 2+ on the left side.      Radial pulses are 2+ on the right side and 2+ on the left side.       Femoral pulses are 2+ on the right side and 2+ on the left side.      Popliteal pulses are 2+ on the right side and 1+ on the left side.       Dorsalis pedis pulses are 0 on the right side and 0 on the left side.       Posterior tibial  pulses are 1+ on the right side and 1+ on the left side.     Heart sounds: S1 normal and S2 normal. No murmur heard. No gallop.      Comments:   Pulmonary:     Effort: Pulmonary effort is normal. No respiratory distress.     Breath sounds: No wheezing, rhonchi or rales.  Musculoskeletal:     Right lower leg: No edema.     Left lower leg: No edema.  Skin:    General: Skin is warm and dry.  Neurological:     Mental Status: He is alert.    Laboratory examination:   Recent Labs    10/04/19 0846 04/12/20 1156  NA 139 145*  K 4.0 4.9  CL 103 107*  CO2 22 25  GLUCOSE 90 99  BUN 17 9  CREATININE 1.02 0.95  CALCIUM 9.5 9.7  GFRNONAA 70 76  GFRAA 80 88   CrCl cannot be calculated (Patient's most recent lab result is older than the maximum 21 days allowed.).  CMP Latest Ref Rng & Units 04/12/2020 10/04/2019 04/01/2019  Glucose 65 - 99 mg/dL 99 90 93  BUN 8 - 27 mg/dL 9 17 10   Creatinine 0.76 - 1.27 mg/dL 3.61 4.43  Sodium 134 - 144 mmol/L 145(H) 139 141  Potassium 3.5 - 5.2 mmol/L 4.9 4.0 4.5  Chloride 96 - 106 mmol/L 107(H) 103 103  CO2 20 - 29 mmol/L 25 22 25   Calcium 8.6 - 10.2 mg/dL 9.7 9.5 9.4  Total Protein 6.0 - 8.5 g/dL 7.8 7.7 7.5  Total Bilirubin 0.0 - 1.2 mg/dL 0.8 0.5 1.1  Alkaline Phos 44 - 121 IU/L 80 82 78  AST 0 - 40 IU/L 26 21 24   ALT 0 - 44 IU/L 28 19 20    CBC Latest Ref Rng & Units 04/12/2020 04/01/2019 11/02/2008  WBC 3.4 - 10.8 x10E3/uL 3.2(L) 4.1 -  Hemoglobin 13.0 - 17.7 g/dL 04/14/2020 13/04/2019  Hematocrit 37.5 - 51.0 % 47.6 42.4 44.0  Platelets 150 - 450 x10E3/uL 216 211 -    Lipid Panel Recent Labs    04/12/20 1156  CHOL 143  TRIG 50  LDLCALC 78  HDL 54  CHOLHDL 2.6   Lipid Panel     Component Value Date/Time   CHOL 143 04/12/2020 1156   TRIG 50 04/12/2020 1156   HDL 54 04/12/2020 1156   CHOLHDL 2.6 04/12/2020 1156   LDLCALC 78 04/12/2020 1156   LABVLDL 11 04/12/2020 1156     HEMOGLOBIN A1C Lab Results  Component Value Date    HGBA1C 5.4 04/01/2019   TSH Recent Labs    04/12/20 1156  TSH 1.540  External labs:   None   Medications and allergies  No Known Allergies   Outpatient Medications Prior to Visit  Medication Sig  . aspirin EC 81 MG tablet Take 81 mg by mouth daily.  Marland Kitchen. atorvastatin (LIPITOR) 10 MG tablet Take 1 tablet (10 mg total) by mouth daily.  Marland Kitchen. azelastine (ASTELIN) 0.1 % nasal spray Place 2 sprays into both nostrils 2 (two) times daily. Use in each nostril as directed  . co-enzyme Q-10 30 MG capsule Take 30 mg by mouth daily.  Marland Kitchen. loratadine (CLARITIN) 10 MG tablet Take 10 mg by mouth daily.  . Melatonin 5 MG CAPS Take by mouth.  . mometasone (NASONEX) 50 MCG/ACT nasal spray Place 2 sprays into the nose daily.  . Multiple Vitamin (MULTIVITAMIN) tablet Take 1 tablet by mouth daily.  . Omega 3 1000 MG CAPS Take by mouth.  . tadalafil (CIALIS) 5 MG tablet Take 1 tablet (5 mg total) by mouth daily as needed for erectile dysfunction.  . triamcinolone (NASACORT) 55 MCG/ACT AERO nasal inhaler Place 2 sprays into the nose daily.  . [DISCONTINUED] amLODipine (NORVASC) 5 MG tablet Take 1 tablet (5 mg total) by mouth daily.  . [DISCONTINUED] olmesartan (BENICAR) 20 MG tablet Take 1 tablet (20 mg total) by mouth daily.   No facility-administered medications prior to visit.    Radiology:   No results found.  Chest x-ray 08/16/2020: Cardiac shadow is within normal limits. Tortuous thoracic aorta is again seen. Lungs are well aerated bilaterally. Healing rib fractures are noted on the right. No focal infiltrate or sizable effusion is seen. No acute bony abnormality is seen. IMPRESSION: No acute abnormality noted.  Cardiac Studies:   Echocardiogram 11/28/2011:  Normal LVEF.  Atrial septal aneurysm, small PFO.  Echocardiogram: Pending  ABI 08/23/2020:  This exam reveals mildly decreased perfusion of the right lower extremity, noted at the anterior tibial and post tibial artery level (ABI  0.88).  There is severely abnormal monophasic waveform at the right ankle. 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   This exam reveals mildly decreased perfusion of the left lower extremity, noted at the anterior tibial and post tibial artery level (ABI 0.88) with mildly abnormal biphasic waveform at the left ankle.   EKG:   EKG 08/14/2020: Sinus rhythm with frequent PVCs in bigeminal pattern at a rate of 79 bpm.  Left atrial enlargement.  Normal axis.  Incomplete right bundle branch block.  IVCD. Low voltage complexes.   EKG 05/21/2012: Normal sinus rhythm at a rate of 56 bpm.  Incomplete right bundle branch block.  Otherwise normal.  No evidence of ischemia or injury pattern.  Assessment     ICD-10-CM   1. Primary hypertension  I10 Basic metabolic panel    amLODipine (NORVASC) 5 MG tablet  2. Mild claudication (HCC)  I73.9   3. Dyspnea on exertion  R06.00      Medications Discontinued During This Encounter  Medication Reason  . olmesartan (BENICAR) 20 MG tablet   . amLODipine (NORVASC) 5 MG tablet     Meds ordered this encounter  Medications  . hydrochlorothiazide (MICROZIDE) 12.5 MG capsule    Sig: Take 1 capsule (12.5 mg total) by mouth in the morning.    Dispense:  30 capsule    Refill:  3  . olmesartan (BENICAR) 20 MG tablet    Sig: Take 1 tablet (20 mg total) by mouth in the morning.    Dispense:  90 tablet    Refill:  2  .  amLODipine (NORVASC) 5 MG tablet    Sig: Take 1 tablet (5 mg total) by mouth every evening.    Dispense:  30 tablet    Refill:  3   Orders Placed This Encounter  Procedures  . Basic metabolic panel    Recommendations:   Chad Miller is a 80 y.o. AA male with history of hypertension, hyperlipidemia, and chronic pain syndrome managed by Dr. Alvester Morin.  Patient was seen in our office by Dr. Jacinto Halim in 2014 for dizziness.  He was then referred back to our office by PCP (at patient's request) for further evaluation and management of hypertension and  hyperlipidemia.   No history of diabetes mellitus, chronic kidney disease, coronary artery disease, MI, CVA or TIA.  Patient now presents for 6-week follow-up and results of cardiac testing.  At last visit added amlodipine 5 mg daily, ordered echocardiogram and chest x-ray, as well as ABI.  Viewed and discussed with patient regarding results of chest x-ray and ABI.  Although ABI revealed mildly reduced perfusion of bilateral lower extremities, patient's symptoms are more suggestive of arthritis and/or neuropathy as underlying etiology.  Discussed with patient symptoms and indication of addition of Plavix for further management of PAD, could consider this in the future.  Patient's blood pressure remains elevated above goal, again discussed regarding low-salt intake.  We will add hydrochlorothiazide 12.5 mg once daily with repeat BMP in 1 week.  Patient will monitor his blood pressure twice weekly at home and bring with him a written log to his next visit.  In regard to patient's symptoms of dyspnea on exertion, results of echocardiogram are still pending, however EKG previously demonstrated low voltage complexes which may be concerning for infiltrative cardiac disease.  I will follow-up on results of echocardiogram.  Follow-up in 6 to 8 weeks, sooner if needed, for hypertension.   Chad Halsted, PA-C 09/25/2020, 1:46 PM Office: [phone]

## 2020-09-25 ENCOUNTER — Ambulatory Visit: Payer: Medicare Other | Admitting: Student

## 2020-09-25 ENCOUNTER — Other Ambulatory Visit: Payer: Self-pay

## 2020-09-25 ENCOUNTER — Encounter: Payer: Self-pay | Admitting: Student

## 2020-09-25 VITALS — BP 156/82 | HR 77 | Temp 97.6°F | Ht 70.0 in | Wt 158.0 lb

## 2020-09-25 DIAGNOSIS — R0609 Other forms of dyspnea: Secondary | ICD-10-CM | POA: Diagnosis not present

## 2020-09-25 DIAGNOSIS — I10 Essential (primary) hypertension: Secondary | ICD-10-CM | POA: Diagnosis not present

## 2020-09-25 DIAGNOSIS — I739 Peripheral vascular disease, unspecified: Secondary | ICD-10-CM

## 2020-09-25 DIAGNOSIS — R0600 Dyspnea, unspecified: Secondary | ICD-10-CM

## 2020-09-25 LAB — ECHOCARDIOGRAM COMPLETE
Area-P 1/2: 3.51 cm2
S' Lateral: 2.9 cm

## 2020-09-25 MED ORDER — HYDROCHLOROTHIAZIDE 12.5 MG PO CAPS: 12.5000 mg | ORAL_CAPSULE | Freq: Every morning | ORAL | 3 refills | Status: DC

## 2020-09-25 MED ORDER — AMLODIPINE BESYLATE 5 MG PO TABS: 5.0000 mg | ORAL_TABLET | Freq: Every evening | ORAL | 3 refills | Status: DC

## 2020-09-25 MED ORDER — OLMESARTAN MEDOXOMIL 20 MG PO TABS: 20.0000 mg | ORAL_TABLET | Freq: Every morning | ORAL | 2 refills | Status: DC

## 2020-09-25 NOTE — Patient Instructions (Signed)
Check BP once weekly, write it down. Keep a log with date, time, blood pressure, and pulse.   Go to [facility] 1 week after starting hydrochlorothiazide for labs.

## 2020-09-25 NOTE — Addendum Note (Signed)
Encounter addended by: Yates Decamp, MD on: 09/25/2020 3:25 PM  Actions taken: Charge Capture section accepted

## 2020-10-04 DIAGNOSIS — I10 Essential (primary) hypertension: Secondary | ICD-10-CM | POA: Diagnosis not present

## 2020-10-05 LAB — BASIC METABOLIC PANEL
BUN/Creatinine Ratio: 20 (ref 10–24)
BUN: 24 mg/dL (ref 8–27)
CO2: 20 mmol/L (ref 20–29)
Calcium: 10.1 mg/dL (ref 8.6–10.2)
Chloride: 99 mmol/L (ref 96–106)
Creatinine, Ser: 1.22 mg/dL (ref 0.76–1.27)
Glucose: 96 mg/dL (ref 65–99)
Potassium: 3.9 mmol/L (ref 3.5–5.2)
Sodium: 139 mmol/L (ref 134–144)
eGFR: 60 mL/min/{1.73_m2} (ref >59)

## 2020-10-05 NOTE — Progress Notes (Signed)
Please notify pt his echo showed normal heart function, will discuss further at next office visit. Also is BMP is stable, continue current medications.

## 2020-10-09 NOTE — Progress Notes (Signed)
Called and spoke to pt regarding results. Pt voiced understanding.

## 2020-10-10 ENCOUNTER — Ambulatory Visit (INDEPENDENT_AMBULATORY_CARE_PROVIDER_SITE_OTHER): Payer: Medicare Other | Admitting: Internal Medicine

## 2020-10-10 ENCOUNTER — Other Ambulatory Visit: Payer: Self-pay

## 2020-10-10 ENCOUNTER — Encounter: Payer: Self-pay | Admitting: Internal Medicine

## 2020-10-10 VITALS — BP 120/68 | HR 50 | Temp 97.6°F | Ht 70.0 in | Wt 151.0 lb

## 2020-10-10 DIAGNOSIS — Z79899 Other long term (current) drug therapy: Secondary | ICD-10-CM

## 2020-10-10 DIAGNOSIS — I739 Peripheral vascular disease, unspecified: Secondary | ICD-10-CM | POA: Diagnosis not present

## 2020-10-10 DIAGNOSIS — I129 Hypertensive chronic kidney disease with stage 1 through stage 4 chronic kidney disease, or unspecified chronic kidney disease: Secondary | ICD-10-CM | POA: Diagnosis not present

## 2020-10-10 DIAGNOSIS — N182 Chronic kidney disease, stage 2 (mild): Secondary | ICD-10-CM

## 2020-10-10 DIAGNOSIS — E7800 Pure hypercholesterolemia, unspecified: Secondary | ICD-10-CM

## 2020-10-10 MED ORDER — TADALAFIL 10 MG PO TABS: 10.0000 mg | ORAL_TABLET | Freq: Every day | ORAL | 0 refills | Status: DC | PRN

## 2020-10-10 NOTE — Progress Notes (Signed)
I,Katawbba Wiggins,acting as a Neurosurgeon for Gwynneth Aliment, MD.,have documented all relevant documentation on the behalf of Gwynneth Aliment, MD,as directed by  Gwynneth Aliment, MD while in the presence of Gwynneth Aliment, MD.  This visit occurred during the SARS-CoV-2 public health emergency.  Safety protocols were in place, including screening questions prior to the visit, additional usage of staff PPE, and extensive cleaning of exam room while observing appropriate contact time as indicated for disinfecting solutions.  Subjective:     Patient ID: Chad Miller , male    DOB: February 23, 1941 , 80 y.o.   MRN: [id number]   Chief Complaint  Patient presents with  . Hypertension  . Hyperlipidemia    HPI  He presents today for BP and cholesterol check. He reports compliance with meds. He denies headaches, chest pain and visual disturbances.   Hypertension This is a chronic problem. The current episode started more than 1 year ago. The problem has been gradually improving since onset. The problem is controlled. Pertinent negatives include no blurred vision, chest pain or shortness of breath. Risk factors for coronary artery disease include dyslipidemia and male gender. Past treatments include angiotensin blockers. Hypertensive end-organ damage includes kidney disease. Identifiable causes of hypertension include chronic renal disease.  Hyperlipidemia This is a chronic problem. The current episode started more than 1 year ago. The problem is controlled. Exacerbating diseases include chronic renal disease. Pertinent negatives include no chest pain or shortness of breath. Current antihyperlipidemic treatment includes statins. The current treatment provides moderate improvement of lipids. Compliance problems include adherence to exercise.  Risk factors for coronary artery disease include dyslipidemia, hypertension and male sex.     Past Medical History:  Diagnosis Date  . High blood pressure   . High  cholesterol   . Loss of vision    Right     Family History  Problem Relation Age of Onset  . Heart disease Mother   . Lung cancer Father   . Heart disease Brother   . Heart disease Brother   . Asthma Sister   . Allergic rhinitis Neg Hx   . Eczema Neg Hx   . Urticaria Neg Hx      Current Outpatient Medications:  .  amLODipine (NORVASC) 5 MG tablet, Take 1 tablet (5 mg total) by mouth every evening., Disp: 30 tablet, Rfl: 3 .  aspirin EC 81 MG tablet, Take 81 mg by mouth daily., Disp: , Rfl:  .  atorvastatin (LIPITOR) 10 MG tablet, Take 1 tablet (10 mg total) by mouth daily., Disp: 90 tablet, Rfl: 2 .  azelastine (ASTELIN) 0.1 % nasal spray, Place 2 sprays into both nostrils 2 (two) times daily. Use in each nostril as directed, Disp: 30 mL, Rfl: 5 .  co-enzyme Q-10 30 MG capsule, Take 30 mg by mouth daily., Disp: , Rfl:  .  hydrochlorothiazide (MICROZIDE) 12.5 MG capsule, Take 1 capsule (12.5 mg total) by mouth in the morning., Disp: 30 capsule, Rfl: 3 .  loratadine (CLARITIN) 10 MG tablet, Take 10 mg by mouth daily., Disp: , Rfl:  .  Melatonin 5 MG CAPS, Take by mouth., Disp: , Rfl:  .  mometasone (NASONEX) 50 MCG/ACT nasal spray, Place 2 sprays into the nose daily., Disp: , Rfl:  .  Multiple Vitamin (MULTIVITAMIN) tablet, Take 1 tablet by mouth daily., Disp: , Rfl:  .  olmesartan (BENICAR) 20 MG tablet, Take 1 tablet (20 mg total) by mouth in the morning., Disp: 90 tablet, Rfl:  2 .  Omega 3 1000 MG CAPS, Take by mouth., Disp: , Rfl:  .  tadalafil (CIALIS) 10 MG tablet, Take 1 tablet (10 mg total) by mouth daily as needed for erectile dysfunction., Disp: 10 tablet, Rfl: 0 .  triamcinolone (NASACORT) 55 MCG/ACT AERO nasal inhaler, Place 2 sprays into the nose daily., Disp: , Rfl:    No Known Allergies   Review of Systems  Constitutional: Negative.   Eyes: Negative for blurred vision.  Respiratory: Negative.  Negative for shortness of breath.   Cardiovascular: Negative.  Negative  for chest pain.  Gastrointestinal: Negative.   Neurological: Negative.   Psychiatric/Behavioral: Negative.      Today's Vitals   10/10/20 1115  BP: 120/68  Pulse: (!) 50  Temp: 97.6 F (36.4 C)  TempSrc: Oral  Weight: 151 lb (68.5 kg)  Height: 5\' 10"  (1.778 m)  PainSc: 8   PainLoc: Back   Body mass index is 21.67 kg/m.  Wt Readings from Last 3 Encounters:  10/10/20 151 lb (68.5 kg)  09/25/20 158 lb (71.7 kg)  08/14/20 151 lb (68.5 kg)   BP Readings from Last 3 Encounters:  10/10/20 120/68  09/25/20 (!) 156/82  08/14/20 (!) 142/70   Objective:  Physical Exam Vitals and nursing note reviewed.  Constitutional:      Appearance: Normal appearance.  HENT:     Nose:     Comments: Masked     Mouth/Throat:     Comments: Masked  Cardiovascular:     Rate and Rhythm: Normal rate and regular rhythm.     Heart sounds: Normal heart sounds.  Pulmonary:     Effort: Pulmonary effort is normal.     Breath sounds: Normal breath sounds.  Skin:    General: Skin is warm.  Neurological:     General: No focal deficit present.     Mental Status: He is alert.  Psychiatric:        Mood and Affect: Mood normal.         Assessment And Plan:     1. Hypertensive nephropathy Comments: Chronic, improved with use of amlodipine. Appreciate Cardiology input. Encouraged to follow low sodium diet. Echo reviewed.  - AMB Referral to [facility] Coordinaton  2. Chronic renal disease, stage II Comments: Chronic, recent bmp from 10/04/20 reviewed. Advised to stay well hydrated.   3. Pure hypercholesterolemia Comments: Chronic, will check lipid panel at next visit. He will continue with atorvastatin 10mg  daily. He agrees to CCM referral. - AMB Referral to [facility] Coordinaton  4. Peripheral artery disease (HCC) Comments: Encouraged to increase daily activity to improve circulation and to comply with statin use.  - AMB Referral to [facility] Coordinaton  5. Drug therapy -  Liver Profile   Patient was given opportunity to ask questions. Patient verbalized understanding of the plan and was able to repeat key elements of the plan. All questions were answered to their satisfaction.   I, [facility], MD, have reviewed all documentation for this visit. The documentation on 10/13/20 for the exam, diagnosis, procedures, and orders are all accurate and complete.   IF YOU HAVE BEEN REFERRED TO A SPECIALIST, IT MAY TAKE 1-2 WEEKS TO SCHEDULE/PROCESS THE REFERRAL. IF YOU HAVE NOT HEARD FROM US/SPECIALIST IN TWO WEEKS, PLEASE GIVE Gwynneth Aliment A CALL AT [phone] X [phone].   THE PATIENT IS ENCOURAGED TO PRACTICE SOCIAL DISTANCING DUE TO THE COVID-19 PANDEMIC.

## 2020-10-10 NOTE — Patient Instructions (Signed)
High Cholesterol  High cholesterol is a condition in which the blood has high levels of a white, waxy substance similar to fat (cholesterol). The liver makes all the cholesterol that the body needs. The human body needs small amounts of cholesterol to help build cells. A person gets extra or excess cholesterol from the food that he or she eats. The blood carries cholesterol from the liver to the rest of the body. If you have high cholesterol, deposits (plaques) may build up on the walls of your arteries. Arteries are the blood vessels that carry blood away from your heart. These plaques make the arteries narrow and stiff. Cholesterol plaques increase your risk for heart attack and stroke. Work with your health care provider to keep your cholesterol levels in a healthy range. What increases the risk? The following factors may make you more likely to develop this condition:  Eating foods that are high in animal fat (saturated fat) or cholesterol.  Being overweight.  Not getting enough exercise.  A family history of high cholesterol (familial hypercholesterolemia).  Use of tobacco products.  Having diabetes. What are the signs or symptoms? There are no symptoms of this condition. How is this diagnosed? This condition may be diagnosed based on the results of a blood test.  If you are older than 81 years of age, your health care provider may check your cholesterol levels every 4-6 years.  You may be checked more often if you have high cholesterol or other risk factors for heart disease. The blood test for cholesterol measures:  "Bad" cholesterol, or LDL cholesterol. This is the main type of cholesterol that causes heart disease. The desired level is less than 100 mg/dL.  "Good" cholesterol, or HDL cholesterol. HDL helps protect against heart disease by cleaning the arteries and carrying the LDL to the liver for processing. The desired level for HDL is 60 mg/dL or higher.  Triglycerides.  These are fats that your body can store or burn for energy. The desired level is less than 150 mg/dL.  Total cholesterol. This measures the total amount of cholesterol in your blood and includes LDL, HDL, and triglycerides. The desired level is less than 200 mg/dL. How is this treated? This condition may be treated with:  Diet changes. You may be asked to eat foods that have more fiber and less saturated fats or added sugar.  Lifestyle changes. These may include regular exercise, maintaining a healthy weight, and quitting use of tobacco products.  Medicines. These are given when diet and lifestyle changes have not worked. You may be prescribed a statin medicine to help lower your cholesterol levels. Follow these instructions at home: Eating and drinking  Eat a healthy, balanced diet. This diet includes: ? Daily servings of a variety of fresh, frozen, or canned fruits and vegetables. ? Daily servings of whole grain foods that are rich in fiber. ? Foods that are low in saturated fats and trans fats. These include poultry and fish without skin, lean cuts of meat, and low-fat dairy products. ? A variety of fish, especially oily fish that contain omega-3 fatty acids. Aim to eat fish at least 2 times a week.  Avoid foods and drinks that have added sugar.  Use healthy cooking methods, such as roasting, grilling, broiling, baking, poaching, steaming, and stir-frying. Do not fry your food except for stir-frying.   Lifestyle  Get regular exercise. Aim to exercise for a total of 150 minutes a week. Increase your activity level by doing activities   such as gardening, walking, and taking the stairs.  Do not use any products that contain nicotine or tobacco, such as cigarettes, e-cigarettes, and chewing tobacco. If you need help quitting, ask your health care provider.   General instructions  Take over-the-counter and prescription medicines only as told by your health care provider.  Keep all  follow-up visits as told by your health care provider. This is important. Where to find more information  American Heart Association: www.heart.org  National Heart, Lung, and Blood Institute: www.nhlbi.nih.gov Contact a health care provider if:  You have trouble achieving or maintaining a healthy diet or weight.  You are starting an exercise program.  You are unable to stop smoking. Get help right away if:  You have chest pain.  You have trouble breathing.  You have any symptoms of a stroke. "BE FAST" is an easy way to remember the main warning signs of a stroke: ? B - Balance. Signs are dizziness, sudden trouble walking, or loss of balance. ? E - Eyes. Signs are trouble seeing or a sudden change in vision. ? F - Face. Signs are sudden weakness or numbness of the face, or the face or eyelid drooping on one side. ? A - Arms. Signs are weakness or numbness in an arm. This happens suddenly and usually on one side of the body. ? S - Speech. Signs are sudden trouble speaking, slurred speech, or trouble understanding what people say. ? T - Time. Time to call emergency services. Write down what time symptoms started.  You have other signs of a stroke, such as: ? A sudden, severe headache with no known cause. ? Nausea or vomiting. ? Seizure. These symptoms may represent a serious problem that is an emergency. Do not wait to see if the symptoms will go away. Get medical help right away. Call your local emergency services (911 in the [place] Do not drive yourself to the hospital. Summary  Cholesterol plaques increase your risk for heart attack and stroke. Work with your health care provider to keep your cholesterol levels in a healthy range.  Eat a healthy, balanced diet, get regular exercise, and maintain a healthy weight.  Do not use any products that contain nicotine or tobacco, such as cigarettes, e-cigarettes, and chewing tobacco.  Get help right away if you have any symptoms of a  stroke. This information is not intended to replace advice given to you by your health care provider. Make sure you discuss any questions you have with your health care provider. Document Revised: 04/05/2019 Document Reviewed: 04/05/2019 Elsevier Patient Education  2021 [employer].  

## 2020-10-11 ENCOUNTER — Telehealth: Payer: Self-pay | Admitting: *Deleted

## 2020-10-11 LAB — HEPATIC FUNCTION PANEL
ALT: 27 IU/L (ref 0–44)
AST: 26 IU/L (ref 0–40)
Albumin: 4.4 g/dL (ref 3.7–4.7)
Alkaline Phosphatase: 82 IU/L (ref 44–121)
Bilirubin Total: 0.8 mg/dL (ref 0.0–1.2)
Bilirubin, Direct: 0.15 mg/dL (ref 0.00–0.40)
Total Protein: 8.3 g/dL (ref 6.0–8.5)

## 2020-10-11 NOTE — Chronic Care Management (AMB) (Signed)
  Chronic Care Management   Note  10/11/2020 Name: [name] MRN: [id number] DOB: 25-Dec-1940  Chad Miller is a 80 y.o. year old male who is a primary care patient of [name], MD. I reached out to Jake Bathe by phone today in response to a referral sent by [name]'s PCP, Dr. Baird Cancer     [name] was given information about Chronic Care Management services today including:  1. CCM service includes personalized support from designated clinical staff supervised by his physician, including individualized plan of care and coordination with other care providers 2. 24/7 contact phone numbers for assistance for urgent and routine care needs. 3. Service will only be billed when office clinical staff spend 20 minutes or more in a month to coordinate care. 4. Only one practitioner may furnish and bill the service in a calendar month. 5. The patient may stop CCM services at any time (effective at the end of the month) by phone call to the office staff. 6. The patient will be responsible for cost sharing (co-pay) of up to 20% of the service fee (after annual deductible is met).  Patient agreed to services and verbal consent obtained.   Follow up plan: Telephone appointment with care management team member scheduled for:11/02/2020  [facility]

## 2020-11-02 ENCOUNTER — Ambulatory Visit (INDEPENDENT_AMBULATORY_CARE_PROVIDER_SITE_OTHER): Payer: Medicare Other

## 2020-11-02 DIAGNOSIS — I129 Hypertensive chronic kidney disease with stage 1 through stage 4 chronic kidney disease, or unspecified chronic kidney disease: Secondary | ICD-10-CM

## 2020-11-02 DIAGNOSIS — N182 Chronic kidney disease, stage 2 (mild): Secondary | ICD-10-CM | POA: Diagnosis not present

## 2020-11-02 DIAGNOSIS — I739 Peripheral vascular disease, unspecified: Secondary | ICD-10-CM

## 2020-11-02 DIAGNOSIS — E7800 Pure hypercholesterolemia, unspecified: Secondary | ICD-10-CM | POA: Diagnosis not present

## 2020-11-02 NOTE — Patient Instructions (Signed)
Social Worker Visit Information   Materials provided: Yes: Provided verbal education regarding care management program  Mr. Weidler was given information about Chronic Care Management services today including:  CCM service includes personalized support from designated clinical staff supervised by his physician, including individualized plan of care and coordination with other care providers 24/7 contact phone numbers for assistance for urgent and routine care needs. Service will only be billed when office clinical staff spend 20 minutes or more in a month to coordinate care. Only one practitioner may furnish and bill the service in a calendar month. The patient may stop CCM services at any time (effective at the end of the month) by phone call to the office staff. The patient will be responsible for cost sharing (co-pay) of up to 20% of the service fee (after annual deductible is met).  Patient agreed to services and verbal consent obtained.   Patient verbalizes understanding of instructions provided today and agrees to view in [employer].   Follow up plan:  No SW follow up planned at this time. Scheduled initial call with RN Care Manager 7/25. Please contact me as needed.  Daneen Schick, BSW, CDP Social Worker, Certified Dementia Practitioner [facility] [phone]

## 2020-11-02 NOTE — Chronic Care Management (AMB) (Signed)
Chronic Care Management    Social Work Note  11/02/2020 Name: Chad Miller MRN: [id number] DOB: Dec 02, 1940  Chad Miller is a 80 y.o. year old male who is a primary care patient of [name], MD. The CCM team was consulted to assist the patient with chronic disease management and/or care coordination needs related to:  Hypertensive Nephropathy, Hyperlipidemia, Chronic Renal Disease Stage II, PAD, and Pure Hypercholesterolemia .   Engaged with patient by telephone for initial visit in response to provider referral for social work chronic care management and care coordination services.   Consent to Services:  The patient was given the following information about Chronic Care Management services today, agreed to services, and gave verbal consent: 1. CCM service includes personalized support from designated clinical staff supervised by the primary care provider, including individualized plan of care and coordination with other care providers 2. 24/7 contact phone numbers for assistance for urgent and routine care needs. 3. Service will only be billed when office clinical staff spend 20 minutes or more in a month to coordinate care. 4. Only one practitioner may furnish and bill the service in a calendar month. 5.The patient may stop CCM services at any time (effective at the end of the month) by phone call to the office staff. 6. The patient will be responsible for cost sharing (co-pay) of up to 20% of the service fee (after annual deductible is met). Patient agreed to services and consent obtained.  Patient agreed to services and consent obtained.   Assessment: Review of patient past medical history, allergies, medications, and health status, including review of relevant consultants reports was performed today as part of a comprehensive evaluation and provision of chronic care management and care coordination services.     SW placed a successful outbound call placed to the patient to perform  SDoH screen. No acute challenges noted at this time. Discussed plan for RN Care Manager to follow up with the patient over the next 45 days to develop an individualized plan of care. Patient agreed with plan and stated he will keep a notebook to write down questions/concerns regarding disease management to review during scheduled call. Collaboration with [facility] to advise of enrollment status and future appointment.   SDOH (Social Determinants of Health) assessments and interventions performed:  SDOH Interventions    Flowsheet Row Most Recent Value  SDOH Interventions   Food Insecurity Interventions Intervention Not Indicated  Housing Interventions Intervention Not Indicated  Transportation Interventions Intervention Not Indicated        Advanced Directives Status:  Patient reports he has Advance Directives. Copy requested.  CCM Care Plan  No Known Allergies  Outpatient Encounter Medications as of 11/02/2020  Medication Sig   amLODipine (NORVASC) 5 MG tablet Take 1 tablet (5 mg total) by mouth every evening.   aspirin EC 81 MG tablet Take 81 mg by mouth daily.   atorvastatin (LIPITOR) 10 MG tablet Take 1 tablet (10 mg total) by mouth daily.   azelastine (ASTELIN) 0.1 % nasal spray Place 2 sprays into both nostrils 2 (two) times daily. Use in each nostril as directed   co-enzyme Q-10 30 MG capsule Take 30 mg by mouth daily.   hydrochlorothiazide (MICROZIDE) 12.5 MG capsule Take 1 capsule (12.5 mg total) by mouth in the morning.   loratadine (CLARITIN) 10 MG tablet Take 10 mg by mouth daily.   Melatonin 5 MG CAPS Take by mouth.   mometasone (NASONEX) 50 MCG/ACT nasal spray Place 2  sprays into the nose daily.   Multiple Vitamin (MULTIVITAMIN) tablet Take 1 tablet by mouth daily.   olmesartan (BENICAR) 20 MG tablet Take 1 tablet (20 mg total) by mouth in the morning.   Omega 3 1000 MG CAPS Take by mouth.   tadalafil (CIALIS) 10 MG tablet Take 1 tablet (10 mg total)  by mouth daily as needed for erectile dysfunction.   triamcinolone (NASACORT) 55 MCG/ACT AERO nasal inhaler Place 2 sprays into the nose daily.   No facility-administered encounter medications on file as of 11/02/2020.    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    Follow Up Plan:  No SW follow up planned at this time. Patient is scheduled to speak with RN Care Manager 12/11/20. Advised the patient to contact SW as needed.      Daneen Schick, BSW, CDP Social Worker, Certified Dementia Practitioner [facility] [place] [phone]  Total time spent performing care coordination and/or care management activities with the patient by phone or face to face = 35 minutes.

## 2020-11-05 NOTE — Progress Notes (Signed)
Primary Physician/Referring:  Dorothyann PengSanders, Robyn, MD  Patient ID: Chad Miller, male    DOB: 30-Apr-1941, 80 y.o.   MRN: [id number]  Chief Complaint  Patient presents with   Hypertension   Results    Echocardiogram   Follow-up    6 weeks   HPI:    Chad Miller  is a 80 y.o. AA male with history of hypertension, hyperlipidemia, and chronic pain syndrome managed by Dr. Alvester MorinNewton.  Patient was seen in our office by Dr. Jacinto HalimGanji in 2014 for dizziness.  He was then referred back to our office by PCP for further evaluation and management of hypertension and hyperlipidemia.   Patient presents for 6 week follow up of hypertension. At last visit added HCTZ 12.5 mg daily, repeat BMP remained stable. Patient has noted occasional episodes of dizziness which he associated with lower blood pressure readings on home monitoring, with lowest reading being 102/48 mmHg. Denies chest pain, palpitations, dyspnea, syncope.   Patient admitts to feelings of high anxiety in regard to blood pressure management, therefore he does not monitor his blood pressure at home on a regular basis.  He also suffers from severe chronic back pain.   Past Medical History:  Diagnosis Date   High blood pressure    High cholesterol    Loss of vision    Right   Past Surgical History:  Procedure Laterality Date   CATARACT EXTRACTION     REPLACEMENT TOTAL KNEE Left 2004   Family History  Problem Relation Age of Onset   Heart disease Mother    Lung cancer Father    Heart disease Brother    Heart disease Brother    Asthma Sister    Allergic rhinitis Neg Hx    Eczema Neg Hx    Urticaria Neg Hx     Social History   Tobacco Use   Smoking status: Never   Smokeless tobacco: Never  Substance Use Topics   Alcohol use: No    Comment: Quit 2015   Marital Status: Married  ROS  Review of Systems  Cardiovascular:  Negative for chest pain, claudication, dyspnea on exertion, near-syncope, orthopnea, palpitations, paroxysmal  nocturnal dyspnea and syncope.  Musculoskeletal:  Positive for arthritis and back pain.  Neurological:  Positive for dizziness.  Objective  Blood pressure 130/64, pulse 97, temperature 98.7 F (37.1 C), temperature source Temporal, resp. rate 17, height 5\' 10"  (1.778 m), weight 151 lb 12.8 oz (68.9 kg), SpO2 96 %.  Vitals with BMI 11/06/2020 10/10/2020 09/25/2020  Height 5\' 10"  5\' 10"  5\' 10"   Weight 151 lbs 13 oz 151 lbs 158 lbs  BMI 21.78 21.67 22.67  Systolic 130 120 [id number]  Diastolic 64 68 82  Pulse 97 50 77     Physical Exam Vitals reviewed.  Cardiovascular:     Rate and Rhythm: Normal rate and regular rhythm. FrequentExtrasystoles are present.    Pulses: Intact distal pulses.          Carotid pulses are 2+ on the right side and 2+ on the left side.      Radial pulses are 2+ on the right side and 2+ on the left side.       Femoral pulses are 2+ on the right side and 2+ on the left side.      Popliteal pulses are 2+ on the right side and 1+ on the left side.       Dorsalis pedis pulses are 0 on the right side and 0 on  the left side.       Posterior tibial pulses are 1+ on the right side and 1+ on the left side.     Heart sounds: S1 normal and S2 normal. No murmur heard.   No gallop.     Comments:   Pulmonary:     Effort: Pulmonary effort is normal. No respiratory distress.     Breath sounds: No wheezing, rhonchi or rales.  Musculoskeletal:     Right lower leg: No edema.     Left lower leg: No edema.  Skin:    General: Skin is warm and dry.  Neurological:     Mental Status: He is alert.   Laboratory examination:   Recent Labs    04/12/20 1156 10/04/20 0954  NA 145* 139  K 4.9 3.9  CL 107* 99  CO2 25 20  GLUCOSE 99 96  BUN 9 24  CREATININE 0.95 1.22  CALCIUM 9.7 10.1  GFRNONAA 76  --   GFRAA 88  --    CrCl cannot be calculated (Patient's most recent lab result is older than the maximum 21 days allowed.).  CMP Latest Ref Rng & Units 10/10/2020 10/04/2020 04/12/2020   Glucose 65 - 99 mg/dL - 96 99  BUN 8 - 27 mg/dL - 24 9  Creatinine 4.58 - 1.27 mg/dL - 0.99 8.33  Sodium 825 - 144 mmol/L - 139 145(H)  Potassium 3.5 - 5.2 mmol/L - 3.9 4.9  Chloride 96 - 106 mmol/L - 99 107(H)  CO2 20 - 29 mmol/L - 20 25  Calcium 8.6 - 10.2 mg/dL - 05.3 9.7  Total Protein 6.0 - 8.5 g/dL 8.3 - 7.8  Total Bilirubin 0.0 - 1.2 mg/dL 0.8 - 0.8  Alkaline Phos 44 - 121 IU/L 82 - 80  AST 0 - 40 IU/L 26 - 26  ALT 0 - 44 IU/L 27 - 28   CBC Latest Ref Rng & Units 04/12/2020 04/01/2019 11/02/2008  WBC 3.4 - 10.8 x10E3/uL 3.2(L) 4.1 -  Hemoglobin 13.0 - 17.7 g/dL 97.6 73.4 19.3  Hematocrit 37.5 - 51.0 % 47.6 42.4 44.0  Platelets 150 - 450 x10E3/uL 216 211 -    Lipid Panel Recent Labs    04/12/20 1156  CHOL 143  TRIG 50  LDLCALC 78  HDL 54  CHOLHDL 2.6   Lipid Panel     Component Value Date/Time   CHOL 143 04/12/2020 1156   TRIG 50 04/12/2020 1156   HDL 54 04/12/2020 1156   CHOLHDL 2.6 04/12/2020 1156   LDLCALC 78 04/12/2020 1156   LABVLDL 11 04/12/2020 1156     HEMOGLOBIN A1C Lab Results  Component Value Date   HGBA1C 5.4 04/01/2019   TSH Recent Labs    04/12/20 1156  TSH 1.540    External labs:   None   Medications and allergies  No Known Allergies   Outpatient Medications Prior to Visit  Medication Sig   aspirin EC 81 MG tablet Take 81 mg by mouth daily.   atorvastatin (LIPITOR) 10 MG tablet Take 1 tablet (10 mg total) by mouth daily.   azelastine (ASTELIN) 0.1 % nasal spray Place 2 sprays into both nostrils 2 (two) times daily. Use in each nostril as directed   co-enzyme Q-10 30 MG capsule Take 30 mg by mouth daily.   loratadine (CLARITIN) 10 MG tablet Take 10 mg by mouth daily.   Melatonin 5 MG CAPS Take by mouth.   mometasone (NASONEX) 50 MCG/ACT nasal spray Place 2  sprays into the nose daily.   Multiple Vitamin (MULTIVITAMIN) tablet Take 1 tablet by mouth daily.   olmesartan (BENICAR) 20 MG tablet Take 1 tablet (20 mg total) by mouth  in the morning.   Omega 3 1000 MG CAPS Take by mouth.   tadalafil (CIALIS) 10 MG tablet Take 1 tablet (10 mg total) by mouth daily as needed for erectile dysfunction.   triamcinolone (NASACORT) 55 MCG/ACT AERO nasal inhaler Place 2 sprays into the nose daily.   [DISCONTINUED] amLODipine (NORVASC) 5 MG tablet Take 1 tablet (5 mg total) by mouth every evening.   [DISCONTINUED] hydrochlorothiazide (MICROZIDE) 12.5 MG capsule Take 1 capsule (12.5 mg total) by mouth in the morning.   No facility-administered medications prior to visit.    Radiology:   No results found.  Chest x-ray 08/16/2020: Cardiac shadow is within normal limits. Tortuous thoracic aorta is again seen. Lungs are well aerated bilaterally. Healing rib fractures are noted on the right. No focal infiltrate or sizable effusion is seen. No acute bony abnormality is seen. IMPRESSION: No acute abnormality noted.  Cardiac Studies:   Echocardiogram 11/28/2011:  Normal LVEF.  Atrial septal aneurysm, small PFO.  Echocardiogram 08/16/2020:  1. 2D echocardiogram reveals preserved LVEF however strain is mildly  abnormal. Diastolic function could not be evaluated due to frequent PVCs  in bigeminal pattern.. Left ventricular ejection fraction, by estimation,  is 55 to 60%. The left ventricle has  normal function. The left ventricle has no regional wall motion  abnormalities. Left ventricular diastolic parameters are indeterminate.  The average left ventricular global longitudinal strain is 13.1 %. The  global longitudinal strain is abnormal.   2. Right ventricular systolic function is normal. The right ventricular  size is normal. Tricuspid regurgitation signal is inadequate for assessing  PA pressure.   3. The mitral valve is normal in structure. Trivial mitral valve  regurgitation. No evidence of mitral stenosis.   4. The aortic valve is tricuspid. Aortic valve regurgitation is mild.  Mild to moderate aortic valve  sclerosis/calcification is present, without  any evidence of aortic stenosis.   ABI 08/23/2020:  This exam reveals mildly decreased perfusion of the right lower extremity, noted at the anterior tibial and post tibial artery level (ABI 0.88).  There is severely abnormal monophasic waveform at the right ankle. 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    This exam reveals mildly decreased perfusion of the left lower extremity, noted at the anterior tibial and post tibial artery level (ABI 0.88) with mildly abnormal biphasic waveform at the left ankle.   EKG:   EKG 08/14/2020: Sinus rhythm with frequent PVCs in bigeminal pattern at a rate of 79 bpm.  Left atrial enlargement.  Normal axis.  Incomplete right bundle branch block.  IVCD. Low voltage complexes.   EKG 05/21/2012: Normal sinus rhythm at a rate of 56 bpm.  Incomplete right bundle branch block.  Otherwise normal.  No evidence of ischemia or injury pattern.  Assessment     ICD-10-CM   1. Primary hypertension  I10 amLODipine (NORVASC) 5 MG tablet    2. Dyspnea on exertion  R06.00 PCV MYOCARDIAL PERFUSION WITH LEXISCAN    3. PVC (premature ventricular contraction)  I49.3        Medications Discontinued During This Encounter  Medication Reason   hydrochlorothiazide (MICROZIDE) 12.5 MG capsule    amLODipine (NORVASC) 5 MG tablet Reorder   hydrochlorothiazide (MICROZIDE) 12.5 MG capsule Discontinued by provider    Meds ordered this encounter  Medications  DISCONTD: hydrochlorothiazide (MICROZIDE) 12.5 MG capsule    Sig: Take 1 capsule (12.5 mg total) by mouth every other day.    Dispense:  30 capsule    Refill:  3   amLODipine (NORVASC) 5 MG tablet    Sig: Take 1 tablet (5 mg total) by mouth every evening.    Dispense:  90 tablet    Refill:  3   metoprolol succinate (TOPROL-XL) 25 MG 24 hr tablet    Sig: Take 1 tablet (25 mg total) by mouth daily. Take with or immediately following a meal.    Dispense:  30 tablet    Refill:  3    Orders Placed This Encounter  Procedures   PCV MYOCARDIAL PERFUSION WITH LEXISCAN    Standing Status:   Future    Standing Expiration Date:   01/08/2021    Recommendations:   Chad Miller is a 81 y.o. AA male with history of hypertension, hyperlipidemia, and chronic pain syndrome managed by Dr. Alvester Morin.  Patient was seen in our office by Dr. Jacinto Halim in 2014 for dizziness.  He was then referred back to our office by PCP (at patient's request) for further evaluation and management of hypertension and hyperlipidemia.   Patient presents for 6 week follow up of hypertension. At last visit added HCTZ 12.5 mg daily, repeat BMP remained stable. Reviewed and discussed with patient regarding results of echocardiogram, details above. Echocardiogram revealed frequent PVCs. 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 Given frequent PVCs and patient's risk factors recommend ischemic evaluation at this time. Will obtain nuclear stress test.   Patient reports dizziness and soft blood pressure with hydrochlorothiazide. Will discontinue it at this time. Will start him on metoprolol 25 mg once daily to suppress PVCs as well as control BP. Will plan to repeat echocardiogram in 6 months -1 year to revaluate cardiomyopathy following suppression of PVCs.   Follow up in 8 weeks, sooner if needed, for results of cardiac testing and hypertension.   Patient was seen in collaboration with Dr. Jacinto Halim and he is in agreement with the plan.     Rayford Halsted, PA-C 11/08/2020, 7:54 PM Office: [phone]

## 2020-11-06 ENCOUNTER — Other Ambulatory Visit: Payer: Self-pay

## 2020-11-06 ENCOUNTER — Ambulatory Visit: Payer: Medicare Other | Admitting: Student

## 2020-11-06 ENCOUNTER — Encounter: Payer: Self-pay | Admitting: Student

## 2020-11-06 VITALS — BP 130/64 | HR 97 | Temp 98.7°F | Resp 17 | Ht 70.0 in | Wt 151.8 lb

## 2020-11-06 DIAGNOSIS — I493 Ventricular premature depolarization: Secondary | ICD-10-CM | POA: Diagnosis not present

## 2020-11-06 DIAGNOSIS — R0609 Other forms of dyspnea: Secondary | ICD-10-CM | POA: Diagnosis not present

## 2020-11-06 DIAGNOSIS — I10 Essential (primary) hypertension: Secondary | ICD-10-CM

## 2020-11-06 DIAGNOSIS — R0600 Dyspnea, unspecified: Secondary | ICD-10-CM

## 2020-11-06 MED ORDER — AMLODIPINE BESYLATE 5 MG PO TABS
5.0000 mg | ORAL_TABLET | Freq: Every evening | ORAL | 3 refills | Status: DC
Start: 2020-11-06 — End: 2021-12-26

## 2020-11-06 MED ORDER — HYDROCHLOROTHIAZIDE 12.5 MG PO CAPS: 12.5000 mg | ORAL_CAPSULE | ORAL | 3 refills | Status: DC

## 2020-11-08 MED ORDER — METOPROLOL SUCCINATE ER 25 MG PO TB24
25.0000 mg | ORAL_TABLET | Freq: Every day | ORAL | 3 refills | Status: DC
Start: 2020-11-08 — End: 2021-03-06

## 2020-12-11 ENCOUNTER — Ambulatory Visit (INDEPENDENT_AMBULATORY_CARE_PROVIDER_SITE_OTHER): Payer: Medicare Other

## 2020-12-11 ENCOUNTER — Telehealth: Payer: Medicare Other

## 2020-12-11 DIAGNOSIS — E7800 Pure hypercholesterolemia, unspecified: Secondary | ICD-10-CM | POA: Diagnosis not present

## 2020-12-11 DIAGNOSIS — I129 Hypertensive chronic kidney disease with stage 1 through stage 4 chronic kidney disease, or unspecified chronic kidney disease: Secondary | ICD-10-CM | POA: Diagnosis not present

## 2020-12-11 DIAGNOSIS — I739 Peripheral vascular disease, unspecified: Secondary | ICD-10-CM

## 2020-12-13 ENCOUNTER — Other Ambulatory Visit: Payer: Self-pay

## 2020-12-13 ENCOUNTER — Ambulatory Visit: Payer: Medicare Other

## 2020-12-13 DIAGNOSIS — R0600 Dyspnea, unspecified: Secondary | ICD-10-CM

## 2020-12-13 DIAGNOSIS — R0609 Other forms of dyspnea: Secondary | ICD-10-CM | POA: Diagnosis not present

## 2020-12-21 NOTE — Patient Instructions (Signed)
Patient Care Plan: Hypertension (Adult)     Problem Identified: Hypertension (Hypertension)   Priority: High     Long-Range Goal: Hypertension Monitored   Start Date: 12/11/2020  Expected End Date: 12/11/2021  This Visit's Progress: On track  Priority: High  Note:   Objective:  Last practice recorded BP readings:  BP Readings from Last 3 Encounters:  11/06/20 130/64  10/10/20 120/68  09/25/20 (!) 156/82   Most recent eGFR/CrCl:  Lab Results  Component Value Date   EGFR 60 10/04/2020    No components found for: CRCL Current Barriers:  Knowledge Deficits related to basic understanding of hypertension pathophysiology and self care management Knowledge Deficits related to understanding of medications prescribed for management of hypertension Case Manager Clinical Goal(s):  patient will verbalize understanding of plan for hypertension management patient will demonstrate improved adherence to prescribed treatment plan for hypertension as evidenced by taking all medications as prescribed, monitoring and recording blood pressure as directed, adhering to low sodium/DASH diet Interventions:  12/11/20 completed successful outbound call with patient  Collaboration with [name], MD regarding development and update of comprehensive plan of care as evidenced by provider attestation and co-signature Inter-disciplinary care team collaboration (see longitudinal plan of care) Evaluation of current treatment plan related to hypertension self management and patient's adherence to plan as established by provider. Provided education to patient re: stroke prevention, s/s of heart attack and stroke, DASH diet, complications of uncontrolled blood pressure Reviewed medications with patient and discussed importance of compliance Advised patient, providing education and rationale, to monitor blood pressure daily and record, calling PCP for findings outside established parameters.  Determined patient is  established with [facility] with most recent follow up completed on 11/06/20, reviewed and discussed the following recommendations:  Recommendations:    Chad Miller is a 80 y.o. AA male with history of hypertension, hyperlipidemia, and chronic pain syndrome managed by Dr. Ernestina Patches.  Patient was seen in our office by Dr. Einar Gip in 2014 for dizziness.  He was then referred back to our office by PCP (at patient's request) for further evaluation and management of hypertension and hyperlipidemia.   Patient presents for 6 week follow up of hypertension. At last visit added HCTZ 12.5 mg daily, repeat BMP remained stable. Reviewed and discussed with patient regarding results of echocardiogram, details above. Echocardiogram revealed frequent PVCs. 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 Given frequent PVCs and patient's risk factors recommend ischemic evaluation at this time. Will obtain nuclear stress test.   Patient reports dizziness and soft blood pressure with hydrochlorothiazide. Will discontinue it at this time. Will start him on metoprolol 25 mg once daily to suppress PVCs as well as control BP. Will plan to repeat echocardiogram in 6 months -1 year to revaluate cardiomyopathy following suppression of PVCs.   Follow up in 8 weeks, sooner if needed, for results of cardiac testing and hypertension.    Patient was seen in collaboration with Dr. Einar Gip and he is in agreement with the plan.  Patient verbalizes understanding of his prescribed treatment recommendations Discussed plans with patient for ongoing care management follow up and provided patient with direct contact information for care management team Reviewed scheduled/upcoming provider appointments including: follow up with [facility] scheduled for 01/01/21 _0  AM Self-Care Activities: Self administers medications as prescribed Attends all scheduled provider appointments Calls provider office for  new concerns, questions, or BP outside discussed parameters Checks BP and records as discussed Follows a low sodium diet/DASH diet Patient Goals: -  check blood pressure daily - write blood pressure results in a log or diary - learn about high blood pressure  Follow Up Plan: Telephone follow up appointment with care management team member scheduled for: 01/09/21     Goals Addressed      Track and Manage My Blood Pressure-Hypertension   On track    Timeframe:  Long-Range Goal Priority:  High Start Date:  12/11/20                           Expected End Date:  12/11/21                     Follow Up Date: 01/09/21    Self-Care Activities: Self administers medications as prescribed Attends all scheduled provider appointments Calls provider office for new concerns, questions, or BP outside discussed parameters Checks BP and records as discussed Follows a low sodium diet/DASH diet Patient Goals: - check blood pressure daily - write blood pressure results in a log or diary - learn about high blood pressure   Why is this important?   You won't feel high blood pressure, but it can still hurt your blood vessels.  High blood pressure can cause heart or kidney problems. It can also cause a stroke.  Making lifestyle changes like losing a [place] weight or eating less salt will help.  Checking your blood pressure at home and at different times of the day can help to control blood pressure.  If the doctor prescribes medicine remember to take it the way the doctor ordered.  Call the office if you cannot afford the medicine or if there are questions about it.     Notes:

## 2020-12-21 NOTE — Chronic Care Management (AMB) (Signed)
Chronic Care Management   CCM RN Visit Note  12/11/2020 Name: Chad Miller MRN: [id number] DOB: [birth year]  Subjective: Chad Miller is a 80 y.o. year old male who is a primary care patient of [name], MD. The care management team was consulted for assistance with disease management and care coordination needs.    Engaged with patient by telephone for initial visit in response to provider referral for case management and/or care coordination services.   Consent to Services:  The patient was given information about Chronic Care Management services, agreed to services, and gave verbal consent prior to initiation of services.  Please see initial visit note for detailed documentation.   Patient agreed to services and verbal consent obtained.   Assessment: Review of patient past medical history, allergies, medications, health status, including review of consultants reports, laboratory and other test data, was performed as part of comprehensive evaluation and provision of chronic care management services.   SDOH (Social Determinants of Health) assessments and interventions performed:  Yes, no acute challenges   CCM Care Plan  No Known Allergies  Outpatient Encounter Medications as of 12/11/2020  Medication Sig   amLODipine (NORVASC) 5 MG tablet Take 1 tablet (5 mg total) by mouth every evening.   aspirin EC 81 MG tablet Take 81 mg by mouth daily.   atorvastatin (LIPITOR) 10 MG tablet Take 1 tablet (10 mg total) by mouth daily.   azelastine (ASTELIN) 0.1 % nasal spray Place 2 sprays into both nostrils 2 (two) times daily. Use in each nostril as directed   co-enzyme Q-10 30 MG capsule Take 30 mg by mouth daily.   loratadine (CLARITIN) 10 MG tablet Take 10 mg by mouth daily.   Melatonin 5 MG CAPS Take by mouth.   metoprolol succinate (TOPROL-XL) 25 MG 24 hr tablet Take 1 tablet (25 mg total) by mouth daily. Take with or immediately following a meal.   mometasone (NASONEX) 50  MCG/ACT nasal spray Place 2 sprays into the nose daily.   Multiple Vitamin (MULTIVITAMIN) tablet Take 1 tablet by mouth daily.   olmesartan (BENICAR) 20 MG tablet Take 1 tablet (20 mg total) by mouth in the morning.   Omega 3 1000 MG CAPS Take by mouth.   tadalafil (CIALIS) 10 MG tablet Take 1 tablet (10 mg total) by mouth daily as needed for erectile dysfunction.   triamcinolone (NASACORT) 55 MCG/ACT AERO nasal inhaler Place 2 sprays into the nose daily.   No facility-administered encounter medications on file as of 12/11/2020.    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    Conditions to be addressed/monitored: Hypertensive nephropathy, Pure Hypercholesterolemia, Peripheral artery disease    Care Plan : Hypertension (Adult)  Updates made by Lynne Logan, RN since 12/11/2020 12:00 AM     Problem: Hypertension (Hypertension)   Priority: High     Long-Range Goal: Hypertension Monitored   Start Date: 12/11/2020  Expected End Date: 12/11/2021  This Visit's Progress: On track  Priority: High  Note:   Objective:  Last practice recorded BP readings:  BP Readings from Last 3 Encounters:  11/06/20 130/64  10/10/20 120/68  09/25/20 (!) 156/82   Most recent eGFR/CrCl:  Lab Results  Component Value Date   EGFR 60 10/04/2020    No components found for: CRCL Current Barriers:  Knowledge Deficits related to basic understanding of hypertension pathophysiology and self care management Knowledge Deficits related to understanding of medications prescribed for management of hypertension Case Manager Clinical  Goal(s):  patient will verbalize understanding of plan for hypertension management patient will demonstrate improved adherence to prescribed treatment plan for hypertension as evidenced by taking all medications as prescribed, monitoring and recording blood pressure as directed, adhering to low sodium/DASH diet Interventions:  12/11/20 completed successful outbound call with patient  Collaboration with [name], MD regarding development and update of comprehensive plan of care as evidenced by provider attestation and co-signature Inter-disciplinary care team collaboration (see longitudinal plan of care) Evaluation of current treatment plan related to hypertension self management and patient's adherence to plan as established by provider. Provided education to patient re: stroke prevention, s/s of heart attack and stroke, DASH diet, complications of uncontrolled blood pressure Reviewed medications with patient and discussed importance of compliance Advised patient, providing education and rationale, to monitor blood pressure daily and record, calling PCP for findings outside established parameters.  Determined patient is established with [facility] with most recent follow up completed on 11/06/20, reviewed and discussed the following recommendations:  Recommendations:    Chad Miller is a 80 y.o. AA male with history of hypertension, hyperlipidemia, and chronic pain syndrome managed by Dr. Ernestina Patches.  Patient was seen in our office by Dr. Einar Gip in 2014 for dizziness.  He was then referred back to our office by PCP (at patient's request) for further evaluation and management of hypertension and hyperlipidemia.   Patient presents for 6 week follow up of hypertension. At last visit added HCTZ 12.5 mg daily, repeat BMP remained stable. Reviewed and discussed with patient regarding results of echocardiogram, details above. 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 Given frequent PVCs and patient's risk factors recommend ischemic evaluation at this time. Will obtain nuclear stress test.   Patient reports dizziness and soft blood pressure with hydrochlorothiazide. Will discontinue it at this time. Will start him on metoprolol 25 mg once daily to suppress PVCs as well as control BP. Will plan to repeat echocardiogram in 6 months -1 year to revaluate cardiomyopathy following suppression of PVCs.   Follow up in 8 weeks, sooner if needed, for results of cardiac testing and hypertension.    Patient was seen in collaboration with Dr. Einar Gip and he is in agreement with the plan.  Patient verbalizes understanding of his prescribed treatment recommendations Discussed plans with patient for ongoing care management follow up and provided patient with direct contact information for care management team Reviewed scheduled/upcoming provider appointments including: follow up with [facility] scheduled for 01/01/21 '@10'  AM Self-Care Activities: Self administers medications as prescribed Attends all scheduled provider appointments Calls provider office for new concerns, questions, or BP outside discussed parameters Checks BP and records as discussed Follows a low sodium diet/DASH diet Patient Goals: - check blood pressure daily - write blood pressure results in a log or diary - learn about high blood pressure  Follow Up Plan: Telephone follow up appointment with care management team member scheduled for: 01/09/21    Plan:Telephone follow up appointment with care management team member scheduled for:  01/09/21  Barb Merino, RN, BSN, CCM Care Management Coordinator [facility]  Direct Phone: [phone]

## 2021-01-01 ENCOUNTER — Other Ambulatory Visit: Payer: Self-pay

## 2021-01-01 ENCOUNTER — Ambulatory Visit: Payer: Medicare Other | Admitting: Student

## 2021-01-01 ENCOUNTER — Encounter: Payer: Self-pay | Admitting: Student

## 2021-01-01 VITALS — BP 126/62 | HR 55 | Temp 98.2°F | Resp 17 | Ht 70.0 in | Wt 149.6 lb

## 2021-01-01 DIAGNOSIS — I493 Ventricular premature depolarization: Secondary | ICD-10-CM

## 2021-01-01 DIAGNOSIS — I10 Essential (primary) hypertension: Secondary | ICD-10-CM

## 2021-01-01 DIAGNOSIS — I517 Cardiomegaly: Secondary | ICD-10-CM | POA: Diagnosis not present

## 2021-01-01 NOTE — Progress Notes (Signed)
Primary Physician/Referring:  [name], MD  Patient ID: Chad Miller, male    DOB: [birth year], 80 y.o.   MRN: [id number]  Chief Complaint  Patient presents with   Hypertension   Follow-up    8 months   HPI:    Chad Miller  is a 79 y.o. AA male with history of hypertension, hyperlipidemia, and chronic pain syndrome managed by Dr. Alvester Morin.  Patient was seen in our office by Dr. Jacinto Halim in 2014 for dizziness.  He was then referred back to our office by PCP for further evaluation and management of hypertension and hyperlipidemia.   Patient presents for 8-week follow-up of hypertension and results of cardiac testing.  At last office visit ordered nuclear stress test given frequent PVCs.  Also discontinued hydrochlorothiazide due to dizziness and started patient on metoprolol 25 mg daily.Patient reports he is feeling relatively well overall with the exception of occasional episodes of lightheadedness when he goes from sitting to standing lasting several seconds.  He also continues to have complaints regarding knee and back pain which he feels are contributing to his uneasiness while walking.  Denies chest pain, palpitations, dyspnea, syncope. Patient admitts to feelings of high anxiety in regard to blood pressure management, therefore he does not monitor his blood pressure at home on a regular basis.   Past Medical History:  Diagnosis Date   High blood pressure    High cholesterol    Loss of vision    Right   Past Surgical History:  Procedure Laterality Date   CATARACT EXTRACTION     REPLACEMENT TOTAL KNEE Left 2004   Family History  Problem Relation Age of Onset   Heart disease Mother    Lung cancer Father    Asthma Sister    Heart disease Brother    Heart disease Brother    Allergic rhinitis Neg Hx    Eczema Neg Hx    Urticaria Neg Hx     Social History   Tobacco Use   Smoking status: Never   Smokeless tobacco: Never  Substance Use Topics   Alcohol use: No     Comment: Quit 2015   Marital Status: Married  ROS  Review of Systems  Cardiovascular:  Negative for chest pain, claudication, dyspnea on exertion, near-syncope, orthopnea, palpitations, paroxysmal nocturnal dyspnea and syncope.  Musculoskeletal:  Positive for arthritis and back pain.  Neurological:  Positive for light-headedness (positional, brief).  Objective  Blood pressure 126/62, pulse (!) 55, temperature 98.2 F (36.8 C), temperature source Temporal, resp. rate 17, height 5\' 10"  (1.778 m), weight 149 lb 9.6 oz (67.9 kg), SpO2 99 %.  Vitals with BMI 01/01/2021 01/01/2021 11/06/2020  Height - 5\' 10"  5\' 10"   Weight - 149 lbs 10 oz 151 lbs 13 oz  BMI - 21.47 21.78  Systolic 126 142 11/08/2020  Diastolic 62 60 64  Pulse 55 55 97     Physical Exam Vitals reviewed.  Cardiovascular:     Rate and Rhythm: Normal rate and regular rhythm. Occasional Extrasystoles are present.    Pulses: Intact distal pulses.          Carotid pulses are 2+ on the right side and 2+ on the left side.      Radial pulses are 2+ on the right side and 2+ on the left side.       Femoral pulses are 2+ on the right side and 2+ on the left side.      Popliteal pulses are 2+  on the right side and 1+ on the left side.       Dorsalis pedis pulses are 0 on the right side and 0 on the left side.       Posterior tibial pulses are 1+ on the right side and 1+ on the left side.     Heart sounds: S1 normal and S2 normal. No murmur heard.   No gallop.     Comments:   Pulmonary:     Effort: Pulmonary effort is normal.     Breath sounds: Normal breath sounds.  Musculoskeletal:     Right lower leg: No edema.     Left lower leg: No edema.   Laboratory examination:   Recent Labs    04/12/20 1156 10/04/20 0954  NA 145* 139  K 4.9 3.9  CL 107* 99  CO2 25 20  GLUCOSE 99 96  BUN 9 24  CREATININE 0.95 1.22  CALCIUM 9.7 10.1  GFRNONAA 76  --   GFRAA 88  --    CrCl cannot be calculated (Patient's most recent lab result is  older than the maximum 21 days allowed.).  CMP Latest Ref Rng & Units 10/10/2020 10/04/2020 04/12/2020  Glucose 65 - 99 mg/dL - 96 99  BUN 8 - 27 mg/dL - 24 9  Creatinine 6.56 - 1.27 mg/dL - 8.12 7.51  Sodium 700 - 144 mmol/L - 139 145(H)  Potassium 3.5 - 5.2 mmol/L - 3.9 4.9  Chloride 96 - 106 mmol/L - 99 107(H)  CO2 20 - 29 mmol/L - 20 25  Calcium 8.6 - 10.2 mg/dL - 17.4 9.7  Total Protein 6.0 - 8.5 g/dL 8.3 - 7.8  Total Bilirubin 0.0 - 1.2 mg/dL 0.8 - 0.8  Alkaline Phos 44 - 121 IU/L 82 - 80  AST 0 - 40 IU/L 26 - 26  ALT 0 - 44 IU/L 27 - 28   CBC Latest Ref Rng & Units 04/12/2020 04/01/2019 11/02/2008  WBC 3.4 - 10.8 x10E3/uL 3.2(L) 4.1 -  Hemoglobin 13.0 - 17.7 g/dL 94.4 96.7 59.1  Hematocrit 37.5 - 51.0 % 47.6 42.4 44.0  Platelets 150 - 450 x10E3/uL 216 211 -    Lipid Panel Recent Labs    04/12/20 1156  CHOL 143  TRIG 50  LDLCALC 78  HDL 54  CHOLHDL 2.6   Lipid Panel     Component Value Date/Time   CHOL 143 04/12/2020 1156   TRIG 50 04/12/2020 1156   HDL 54 04/12/2020 1156   CHOLHDL 2.6 04/12/2020 1156   LDLCALC 78 04/12/2020 1156   LABVLDL 11 04/12/2020 1156     HEMOGLOBIN A1C Lab Results  Component Value Date   HGBA1C 5.4 04/01/2019   TSH Recent Labs    04/12/20 1156  TSH 1.540    External labs:  None   Allergies  No Known Allergies    Medications Prior to Visit:   Outpatient Medications Prior to Visit  Medication Sig Dispense Refill   amLODipine (NORVASC) 5 MG tablet Take 1 tablet (5 mg total) by mouth every evening. 90 tablet 3   atorvastatin (LIPITOR) 10 MG tablet Take 1 tablet (10 mg total) by mouth daily. 90 tablet 2   azelastine (ASTELIN) 0.1 % nasal spray Place 2 sprays into both nostrils 2 (two) times daily. Use in each nostril as directed 30 mL 5   co-enzyme Q-10 30 MG capsule Take 30 mg by mouth daily.     loratadine (CLARITIN) 10 MG tablet Take 10 mg  by mouth daily.     Melatonin 5 MG CAPS Take by mouth.     metoprolol succinate  (TOPROL-XL) 25 MG 24 hr tablet Take 1 tablet (25 mg total) by mouth daily. Take with or immediately following a meal. 30 tablet 3   mometasone (NASONEX) 50 MCG/ACT nasal spray Place 2 sprays into the nose daily.     Multiple Vitamin (MULTIVITAMIN) tablet Take 1 tablet by mouth daily.     Nutritional Supplements (EQUATE) LIQD Take by mouth daily.     olmesartan (BENICAR) 20 MG tablet Take 1 tablet (20 mg total) by mouth in the morning. 90 tablet 2   Omega 3 1000 MG CAPS Take by mouth.     tadalafil (CIALIS) 10 MG tablet Take 1 tablet (10 mg total) by mouth daily as needed for erectile dysfunction. 10 tablet 0   triamcinolone (NASACORT) 55 MCG/ACT AERO nasal inhaler Place 2 sprays into the nose daily.     aspirin EC 81 MG tablet Take 81 mg by mouth daily.     No facility-administered medications prior to visit.   Final Medications at End of Visit    Current Meds  Medication Sig   amLODipine (NORVASC) 5 MG tablet Take 1 tablet (5 mg total) by mouth every evening.   atorvastatin (LIPITOR) 10 MG tablet Take 1 tablet (10 mg total) by mouth daily.   azelastine (ASTELIN) 0.1 % nasal spray Place 2 sprays into both nostrils 2 (two) times daily. Use in each nostril as directed   co-enzyme Q-10 30 MG capsule Take 30 mg by mouth daily.   loratadine (CLARITIN) 10 MG tablet Take 10 mg by mouth daily.   Melatonin 5 MG CAPS Take by mouth.   metoprolol succinate (TOPROL-XL) 25 MG 24 hr tablet Take 1 tablet (25 mg total) by mouth daily. Take with or immediately following a meal.   mometasone (NASONEX) 50 MCG/ACT nasal spray Place 2 sprays into the nose daily.   Multiple Vitamin (MULTIVITAMIN) tablet Take 1 tablet by mouth daily.   Nutritional Supplements (EQUATE) LIQD Take by mouth daily.   olmesartan (BENICAR) 20 MG tablet Take 1 tablet (20 mg total) by mouth in the morning.   Omega 3 1000 MG CAPS Take by mouth.   tadalafil (CIALIS) 10 MG tablet Take 1 tablet (10 mg total) by mouth daily as needed for  erectile dysfunction.   triamcinolone (NASACORT) 55 MCG/ACT AERO nasal inhaler Place 2 sprays into the nose daily.   Radiology:   No results found.  Chest x-ray 08/16/2020: Cardiac shadow is within normal limits. Tortuous thoracic aorta is again seen. Lungs are well aerated bilaterally. Healing rib fractures are noted on the right. No focal infiltrate or sizable effusion is seen. No acute bony abnormality is seen. IMPRESSION: No acute abnormality noted.  Cardiac Studies:   Echocardiogram 11/28/2011:  Normal LVEF.  Atrial septal aneurysm, small PFO.  Echocardiogram 08/16/2020:  1. 2D echocardiogram reveals preserved LVEF however strain is mildly  abnormal. Diastolic function could not be evaluated due to frequent PVCs  in bigeminal pattern.. Left ventricular ejection fraction, by estimation,  is 55 to 60%. The left ventricle has  normal function. The left ventricle has no regional wall motion  abnormalities. Left ventricular diastolic parameters are indeterminate.  The average left ventricular global longitudinal strain is 13.1 %. The  global longitudinal strain is abnormal.   2. Right ventricular systolic function is normal. The right ventricular  size is normal. Tricuspid regurgitation signal is inadequate for  assessing  PA pressure.   3. The mitral valve is normal in structure. Trivial mitral valve  regurgitation. No evidence of mitral stenosis.   4. The aortic valve is tricuspid. Aortic valve regurgitation is mild.  Mild to moderate aortic valve sclerosis/calcification is present, without  any evidence of aortic stenosis.   ABI 08/23/2020:  This exam reveals mildly decreased perfusion of the right lower extremity, noted at the anterior tibial and post tibial artery level (ABI 0.88).  There is severely abnormal monophasic waveform at the right ankle. 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    This exam reveals mildly decreased perfusion of the left lower extremity, noted at the anterior  tibial and post tibial artery level (ABI 0.88) with mildly abnormal biphasic waveform at the left ankle.   PCV MYOCARDIAL PERFUSION WITH LEXISCAN 12/13/2020 1 Day Rest/Stress Protocol. Stress EKG is non-diagnostic, as this is pharmacological stress test using Lexiscan. No convincing evidence of reversible myocardial ischemia or prior infarct. Left ventricular ejection fraction is 51% , visually appears normal. Left ventricular wall thickness is preserved without regional wall motion abnormalities. Low risk study.  EKG:   EKG 08/14/2020: Sinus rhythm with frequent PVCs in bigeminal pattern at a rate of 79 bpm.  Left atrial enlargement.  Normal axis.  Incomplete right bundle branch block.  IVCD. Low voltage complexes.   EKG 05/21/2012: Normal sinus rhythm at a rate of 56 bpm.  Incomplete right bundle branch block.  Otherwise normal.  No evidence of ischemia or injury pattern.  Assessment     ICD-10-CM   1. Primary hypertension  I10     2. PVC (premature ventricular contraction)  I49.3 PCV ECHOCARDIOGRAM COMPLETE    3. Left atrial enlargement  I51.7 PCV ECHOCARDIOGRAM COMPLETE       Medications Discontinued During This Encounter  Medication Reason   aspirin EC 81 MG tablet Error    No orders of the defined types were placed in this encounter.  Orders Placed This Encounter  Procedures   PCV ECHOCARDIOGRAM COMPLETE    Standing Status:   Future    Standing Expiration Date:   01/01/2022    Recommendations:   Chad ChickOliver Miller is a 80 y.o. AA male with history of hypertension, hyperlipidemia, and chronic pain syndrome managed by Dr. Alvester MorinNewton.  Patient was seen in our office by Dr. Jacinto HalimGanji in 2014 for dizziness.  He was then referred back to our office by PCP (at patient's request) for further evaluation and management of hypertension and hyperlipidemia.   Patient presents for 8-week follow-up of hypertension and results of cardiac testing.  At last office visit ordered nuclear stress test  given frequent PVCs.  Also discontinued hydrochlorothiazide due to dizziness and started patient on metoprolol 25 mg daily.  Patient's dizziness has improved and on exam he has no longer having frequent extrasystole.  He continues to have positional lightheadedness, however this has improved.  Advised him regarding conservative measures including sitting on the edge of the bed before getting up and slowly changing positions from sitting to standing.  Shared decision was that benefits of metoprolol outweigh patient's symptoms of lightheadedness, however patient will notify our office if symptoms worsen.  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  Reviewed and discussed with patient regarding results of nuclear stress testing, details above.  Stress test was low risk, do not suspect ischemic etiology of patient's PVCs.  Patient is otherwise stable  from cardiovascular standpoint.  Follow-up in 6 months, sooner if needed, for PVCs, hypertension, hyperlipidemia.   Rayford Halsted, PA-C 01/01/2021, 1:11 PM Office: [phone]

## 2021-01-09 ENCOUNTER — Telehealth: Payer: Medicare Other

## 2021-01-09 ENCOUNTER — Ambulatory Visit (INDEPENDENT_AMBULATORY_CARE_PROVIDER_SITE_OTHER): Payer: Medicare Other

## 2021-01-09 DIAGNOSIS — I739 Peripheral vascular disease, unspecified: Secondary | ICD-10-CM

## 2021-01-09 DIAGNOSIS — E7800 Pure hypercholesterolemia, unspecified: Secondary | ICD-10-CM | POA: Diagnosis not present

## 2021-01-09 DIAGNOSIS — I129 Hypertensive chronic kidney disease with stage 1 through stage 4 chronic kidney disease, or unspecified chronic kidney disease: Secondary | ICD-10-CM | POA: Diagnosis not present

## 2021-01-12 DIAGNOSIS — H524 Presbyopia: Secondary | ICD-10-CM | POA: Diagnosis not present

## 2021-01-12 DIAGNOSIS — H5711 Ocular pain, right eye: Secondary | ICD-10-CM | POA: Diagnosis not present

## 2021-01-17 NOTE — Patient Instructions (Signed)
Goals Addressed      Track and Manage My Blood Pressure-Hypertension   On track    Timeframe:  Long-Range Goal Priority:  High Start Date:  12/11/20                           Expected End Date:  12/11/21                     Follow Up Date: 04/23/21    Self-Care Activities: Self administers medications as prescribed Attends all scheduled provider appointments Calls provider office for new concerns, questions, or BP outside discussed parameters Checks BP and records as discussed Follows a low sodium diet/DASH diet Patient Goals: - check blood pressure daily - write blood pressure results in a log or diary - learn about high blood pressure   Why is this important?   You won't feel high blood pressure, but it can still hurt your blood vessels.  High blood pressure can cause heart or kidney problems. It can also cause a stroke.  Making lifestyle changes like losing a Chad Miller weight or eating less salt will help.  Checking your blood pressure at home and at different times of the day can help to control blood pressure.  If the doctor prescribes medicine remember to take it the way the doctor ordered.  Call the office if you cannot afford the medicine or if there are questions about it.     Notes:

## 2021-01-17 NOTE — Chronic Care Management (AMB) (Signed)
Chronic Care Management   CCM RN Visit Note  01/09/2021 Name: Chad Miller MRN: [id number] DOB: Oct 16, 1940  Subjective: Chad Miller is a 80 y.o. year old male who is a primary care patient of [name], MD. The care management team was consulted for assistance with disease management and care coordination needs.    Engaged with patient by telephone for follow up visit in response to provider referral for case management and/or care coordination services.   Consent to Services:  The patient was given information about Chronic Care Management services, agreed to services, and gave verbal consent prior to initiation of services.  Please see initial visit note for detailed documentation.   Patient agreed to services and verbal consent obtained.   Assessment: Review of patient past medical history, allergies, medications, health status, including review of consultants reports, laboratory and other test data, was performed as part of comprehensive evaluation and provision of chronic care management services.   SDOH (Social Determinants of Health) assessments and interventions performed:    CCM Care Plan  No Known Allergies  Outpatient Encounter Medications as of 01/09/2021  Medication Sig   amLODipine (NORVASC) 5 MG tablet Take 1 tablet (5 mg total) by mouth every evening.   atorvastatin (LIPITOR) 10 MG tablet Take 1 tablet (10 mg total) by mouth daily.   azelastine (ASTELIN) 0.1 % nasal spray Place 2 sprays into both nostrils 2 (two) times daily. Use in each nostril as directed   co-enzyme Q-10 30 MG capsule Take 30 mg by mouth daily.   loratadine (CLARITIN) 10 MG tablet Take 10 mg by mouth daily.   Melatonin 5 MG CAPS Take by mouth.   metoprolol succinate (TOPROL-XL) 25 MG 24 hr tablet Take 1 tablet (25 mg total) by mouth daily. Take with or immediately following a meal.   mometasone (NASONEX) 50 MCG/ACT nasal spray Place 2 sprays into the nose daily.   Multiple Vitamin  (MULTIVITAMIN) tablet Take 1 tablet by mouth daily.   Nutritional Supplements (EQUATE) LIQD Take by mouth daily.   olmesartan (BENICAR) 20 MG tablet Take 1 tablet (20 mg total) by mouth in the morning.   Omega 3 1000 MG CAPS Take by mouth.   tadalafil (CIALIS) 10 MG tablet Take 1 tablet (10 mg total) by mouth daily as needed for erectile dysfunction.   triamcinolone (NASACORT) 55 MCG/ACT AERO nasal inhaler Place 2 sprays into the nose daily.   No facility-administered encounter medications on file as of 01/09/2021.    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    Conditions to be addressed/monitored: Hypertensive nephropathy, Pure Hypercholesterolemia, Peripheral artery disease    Care Plan : Hypertension (Adult)  Updates made by Lynne Logan, RN since 01/09/2021 12:00 AM     Problem: Hypertension (Hypertension)   Priority: High     Long-Range Goal: Hypertension Monitored   Start Date: 12/11/2020  Expected End Date: 12/11/2021  Recent Progress: On track  Priority: High  Note:   Objective:  Last practice recorded BP readings:  BP Readings from Last 3 Encounters:  01/01/21 126/62  11/06/20 130/64  10/10/20 120/68  Most recent eGFR/CrCl:  Lab Results  Component Value Date   EGFR 60 10/04/2020    No components found for: CRCL Current Barriers:  Knowledge Deficits related to basic understanding of hypertension pathophysiology and self care management Knowledge Deficits related to understanding of medications prescribed for management of hypertension Case Manager Clinical Goal(s):  patient will  verbalize understanding of plan for hypertension management patient will demonstrate improved adherence to prescribed treatment plan for hypertension as evidenced by taking all medications as prescribed, monitoring and recording blood pressure as directed, adhering to low sodium/DASH diet Interventions:  01/09/21 completed successful outbound call with patient  Collaboration with [name], MD regarding development and update of comprehensive plan of care as evidenced by provider attestation and co-signature Inter-disciplinary care team collaboration (see longitudinal plan of care) Evaluation of current treatment plan related to hypertension self management and patient's adherence to plan as established by provider. Reviewed medications with patient and discussed importance of compliance Advised patient, providing education and rationale, to monitor blood pressure daily and record, calling PCP for findings outside established parameters.  Determined patient is established with [facility] with most recent follow up completed on 01/01/21, reviewed and discussed the following recommendations: Recommendations:    Chad Miller is a 80 y.o. AA male with history of hypertension, hyperlipidemia, and chronic pain syndrome managed by Dr. Ernestina Patches.  Patient was seen in our office by Dr. Einar Gip in 2014 for dizziness.  He was then referred back to our office by PCP (at patient's request) for further evaluation and management of hypertension and hyperlipidemia.    Patient presents for 8-week follow-up of hypertension and results of cardiac testing.  At last office visit ordered nuclear stress test given frequent PVCs.  Also discontinued hydrochlorothiazide due to dizziness and started patient on metoprolol 25 mg daily.  Patient's dizziness has improved and on exam he has no longer having frequent extrasystole.  He continues to have positional lightheadedness, however this has improved.  Advised him  regarding conservative measures including sitting on the edge of the bed before getting up and slowly changing positions from sitting to standing.  Shared decision was that benefits of metoprolol outweigh patient's symptoms of lightheadedness, however patient will notify our office if symptoms worsen.  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  Reviewed and discussed with patient regarding results of nuclear stress testing, details above.  Stress test was low risk, do not suspect ischemic etiology of patient's PVCs.  Patient is otherwise stable from cardiovascular standpoint.  Follow-up in 6 months, sooner if needed, for PVCs, hypertension, hyperlipidemia.  Answered questions for patient regarding medication changes including potential SE and when to call the doctor Confirmed patient verbalizes understanding of his prescribed treatment plan  Mailed printed educational materials related to Cooking with High Cholesterol  Discussed plans with patient for ongoing care management follow up and provided patient with direct contact information for care management team Self-Care Activities: Self administers medications as prescribed Attends all scheduled provider appointments Calls provider office for new concerns, questions, or BP outside discussed parameters Checks BP and records as discussed Follows a low sodium diet/DASH diet Patient Goals: - check blood pressure daily - write blood pressure results in a log or diary - learn about high blood pressure  Follow Up Plan: Telephone follow up appointment with care management team member scheduled for: 04/23/21    Plan:Telephone follow up appointment with care  management team member scheduled for:  04/23/21  Barb Merino, RN, BSN, CCM Care Management Coordinator [facility]  Direct Phone: [phone]

## 2021-03-06 ENCOUNTER — Telehealth: Payer: Self-pay | Admitting: Student

## 2021-03-06 ENCOUNTER — Other Ambulatory Visit: Payer: Self-pay

## 2021-03-06 MED ORDER — METOPROLOL SUCCINATE ER 25 MG PO TB24: 25.0000 mg | ORAL_TABLET | Freq: Every day | ORAL | 1 refills | Status: DC

## 2021-03-06 NOTE — Telephone Encounter (Signed)
Ref req for   metoprolol succinate (TOPROL-XL) 25 MG 24 hr tablet  [facility] [id number] Ginette Otto, [place] - [address]  [phone]

## 2021-03-09 DIAGNOSIS — Z23 Encounter for immunization: Secondary | ICD-10-CM | POA: Diagnosis not present

## 2021-03-27 DIAGNOSIS — Z23 Encounter for immunization: Secondary | ICD-10-CM | POA: Diagnosis not present

## 2021-04-03 ENCOUNTER — Ambulatory Visit: Payer: Medicare Other | Admitting: Student

## 2021-04-03 ENCOUNTER — Other Ambulatory Visit: Payer: Self-pay

## 2021-04-03 ENCOUNTER — Encounter: Payer: Self-pay | Admitting: Student

## 2021-04-03 VITALS — BP 134/68 | HR 58 | Temp 98.0°F | Resp 16 | Ht 70.0 in | Wt 143.0 lb

## 2021-04-03 DIAGNOSIS — R0609 Other forms of dyspnea: Secondary | ICD-10-CM | POA: Diagnosis not present

## 2021-04-03 DIAGNOSIS — R42 Dizziness and giddiness: Secondary | ICD-10-CM | POA: Diagnosis not present

## 2021-04-03 NOTE — Progress Notes (Signed)
Primary Physician/Referring:  Dorothyann PengSanders, Robyn, MD  Patient ID: Chad Miller, male    DOB: 08/15/1940, 80 y.o.   MRN: [id number]  Chief Complaint  Patient presents with   Hypertension   Leg Swelling   Dizziness   HPI:    Chad Miller  is a 80 y.o. AA male with history of hypertension, hyperlipidemia, and chronic pain syndrome managed by Dr. Alvester MorinNewton.  Patient was seen in our office by Dr. Jacinto HalimGanji in 2014 for dizziness.  He was then referred back to our office by PCP for further evaluation and management of hypertension and hyperlipidemia.   Patient presents for urgent visit with complaints of dizziness, this is similar to what he has complained about at multiple visits prior to today.  He was last seen in our office 01/01/2021 at which time patient was stable from a cardiovascular standpoint and no changes were made.  Patient continues to have occasional episodes of lightheadedness when he goes from sitting to standing lasting several seconds.  Denies syncope or near syncope.  Patient also has multiple orthopedic complaints today.  He is scheduled for annual with his PCP on 04/19/2021.  Denies chest pain, palpitations, orthopnea, PND, leg swelling.  Patient does report mild fatigue and dyspnea on exertion.  Past Medical History:  Diagnosis Date   High blood pressure    High cholesterol    Loss of vision    Right   Past Surgical History:  Procedure Laterality Date   CATARACT EXTRACTION     REPLACEMENT TOTAL KNEE Left 2004   Family History  Problem Relation Age of Onset   Heart disease Mother    Lung cancer Father    Asthma Sister    Heart disease Brother    Heart disease Brother    Allergic rhinitis Neg Hx    Eczema Neg Hx    Urticaria Neg Hx     Social History   Tobacco Use   Smoking status: Never   Smokeless tobacco: Never  Substance Use Topics   Alcohol use: No    Comment: Quit 2015   Marital Status: Married  ROS  Review of Systems  Constitutional: Positive for  malaise/fatigue.  Cardiovascular:  Positive for dyspnea on exertion. Negative for chest pain, claudication, near-syncope, orthopnea, palpitations, paroxysmal nocturnal dyspnea and syncope.  Musculoskeletal:  Positive for arthritis and back pain.  Neurological:  Positive for light-headedness (positional, brief).  Objective  Blood pressure 134/68, pulse (!) 58, temperature 98 F (36.7 C), resp. rate 16, height 5\' 10"  (1.778 m), weight 143 lb (64.9 kg), SpO2 97 %.  Vitals with BMI 04/03/2021 01/01/2021 01/01/2021  Height 5\' 10"  - 5\' 10"   Weight 143 lbs - 149 lbs 10 oz  BMI 20.52 - 21.47  Systolic 134 126 [id number]  Diastolic 68 62 60  Pulse 58 55 55    Orthostatic VS for the past 72 hrs (Last 3 readings):  Orthostatic BP Patient Position BP Location Cuff Size Orthostatic Pulse  04/03/21 1429 -- Sitting Right Arm Normal --  04/03/21 1426 149/72 Standing Left Arm Normal 61  04/03/21 1424 131/66 Sitting Left Arm Normal 60  04/03/21 1420 138/68 Supine Left Arm Normal 56     Physical Exam Vitals reviewed.  Cardiovascular:     Rate and Rhythm: Normal rate and regular rhythm. Occasional Extrasystoles are present.    Pulses: Intact distal pulses.          Carotid pulses are 2+ on the right side and 2+ on the left  side.      Radial pulses are 2+ on the right side and 2+ on the left side.       Femoral pulses are 2+ on the right side and 2+ on the left side.      Popliteal pulses are 2+ on the right side and 1+ on the left side.       Dorsalis pedis pulses are 0 on the right side and 0 on the left side.       Posterior tibial pulses are 1+ on the right side and 1+ on the left side.     Heart sounds: S1 normal and S2 normal. No murmur heard.   No gallop.     Comments:   Pulmonary:     Effort: Pulmonary effort is normal.     Breath sounds: Normal breath sounds.  Musculoskeletal:     Right lower leg: No edema.     Left lower leg: No edema.  Physical exam is unchanged compared to previous office  visit.  Laboratory examination:   Recent Labs    04/12/20 1156 10/04/20 0954  NA 145* 139  K 4.9 3.9  CL 107* 99  CO2 25 20  GLUCOSE 99 96  BUN 9 24  CREATININE 0.95 1.22  CALCIUM 9.7 10.1  GFRNONAA 76  --   GFRAA 88  --    CrCl cannot be calculated (Patient's most recent lab result is older than the maximum 21 days allowed.).  CMP Latest Ref Rng & Units 10/10/2020 10/04/2020 04/12/2020  Glucose 65 - 99 mg/dL - 96 99  BUN 8 - 27 mg/dL - 24 9  Creatinine 0.76 - 1.27 mg/dL - 1.22 0.95  Sodium 134 - 144 mmol/L - 139 145(H)  Potassium 3.5 - 5.2 mmol/L - 3.9 4.9  Chloride 96 - 106 mmol/L - 99 107(H)  CO2 20 - 29 mmol/L - 20 25  Calcium 8.6 - 10.2 mg/dL - 10.1 9.7  Total Protein 6.0 - 8.5 g/dL 8.3 - 7.8  Total Bilirubin 0.0 - 1.2 mg/dL 0.8 - 0.8  Alkaline Phos 44 - 121 IU/L 82 - 80  AST 0 - 40 IU/L 26 - 26  ALT 0 - 44 IU/L 27 - 28   CBC Latest Ref Rng & Units 04/12/2020 04/01/2019 11/02/2008  WBC 3.4 - 10.8 x10E3/uL 3.2(L) 4.1 -  Hemoglobin 13.0 - 17.7 g/dL 16.3 15.0 15.0  Hematocrit 37.5 - 51.0 % 47.6 42.4 44.0  Platelets 150 - 450 x10E3/uL 216 211 -    Lipid Panel Recent Labs    04/12/20 1156  CHOL 143  TRIG 50  LDLCALC 78  HDL 54  CHOLHDL 2.6   Lipid Panel     Component Value Date/Time   CHOL 143 04/12/2020 1156   TRIG 50 04/12/2020 1156   HDL 54 04/12/2020 1156   CHOLHDL 2.6 04/12/2020 1156   LDLCALC 78 04/12/2020 1156   LABVLDL 11 04/12/2020 1156     HEMOGLOBIN A1C Lab Results  Component Value Date   HGBA1C 5.4 04/01/2019   TSH Recent Labs    04/12/20 1156  TSH 1.540    External labs:  None   Allergies  No Known Allergies    Medications Prior to Visit:   Outpatient Medications Prior to Visit  Medication Sig Dispense Refill   amLODipine (NORVASC) 5 MG tablet Take 1 tablet (5 mg total) by mouth every evening. 90 tablet 3   atorvastatin (LIPITOR) 10 MG tablet Take 1 tablet (10 mg total)  by mouth daily. 90 tablet 2   azelastine (ASTELIN)  0.1 % nasal spray Place 2 sprays into both nostrils 2 (two) times daily. Use in each nostril as directed 30 mL 5   co-enzyme Q-10 30 MG capsule Take 30 mg by mouth daily.     loratadine (CLARITIN) 10 MG tablet Take 10 mg by mouth daily.     Melatonin 5 MG CAPS Take by mouth.     metoprolol succinate (TOPROL-XL) 25 MG 24 hr tablet Take 1 tablet (25 mg total) by mouth daily. Take with or immediately following a meal. 90 tablet 1   mometasone (NASONEX) 50 MCG/ACT nasal spray Place 2 sprays into the nose daily.     Multiple Vitamin (MULTIVITAMIN) tablet Take 1 tablet by mouth daily.     Nutritional Supplements (EQUATE) LIQD Take by mouth daily.     olmesartan (BENICAR) 20 MG tablet Take 1 tablet (20 mg total) by mouth in the morning. 90 tablet 2   Omega 3 1000 MG CAPS Take by mouth.     tadalafil (CIALIS) 10 MG tablet Take 1 tablet (10 mg total) by mouth daily as needed for erectile dysfunction. 10 tablet 0   triamcinolone (NASACORT) 55 MCG/ACT AERO nasal inhaler Place 2 sprays into the nose daily.     No facility-administered medications prior to visit.   Final Medications at End of Visit    Current Meds  Medication Sig   amLODipine (NORVASC) 5 MG tablet Take 1 tablet (5 mg total) by mouth every evening.   atorvastatin (LIPITOR) 10 MG tablet Take 1 tablet (10 mg total) by mouth daily.   azelastine (ASTELIN) 0.1 % nasal spray Place 2 sprays into both nostrils 2 (two) times daily. Use in each nostril as directed   co-enzyme Q-10 30 MG capsule Take 30 mg by mouth daily.   loratadine (CLARITIN) 10 MG tablet Take 10 mg by mouth daily.   Melatonin 5 MG CAPS Take by mouth.   metoprolol succinate (TOPROL-XL) 25 MG 24 hr tablet Take 1 tablet (25 mg total) by mouth daily. Take with or immediately following a meal.   mometasone (NASONEX) 50 MCG/ACT nasal spray Place 2 sprays into the nose daily.   Multiple Vitamin (MULTIVITAMIN) tablet Take 1 tablet by mouth daily.   Nutritional Supplements (EQUATE)  LIQD Take by mouth daily.   olmesartan (BENICAR) 20 MG tablet Take 1 tablet (20 mg total) by mouth in the morning.   Omega 3 1000 MG CAPS Take by mouth.   Radiology:   No results found.  Chest x-ray 08/16/2020: Cardiac shadow is within normal limits. Tortuous thoracic aorta is again seen. Lungs are well aerated bilaterally. Healing rib fractures are noted on the right. No focal infiltrate or sizable effusion is seen. No acute bony abnormality is seen. IMPRESSION: No acute abnormality noted.  Cardiac Studies:   Echocardiogram 11/28/2011:  Normal LVEF.  Atrial septal aneurysm, small PFO.  Echocardiogram 08/16/2020:  1. 2D echocardiogram reveals preserved LVEF however strain is mildly  abnormal. Diastolic function could not be evaluated due to frequent PVCs  in bigeminal pattern.. Left ventricular ejection fraction, by estimation,  is 55 to 60%. The left ventricle has  normal function. The left ventricle has no regional wall motion  abnormalities. Left ventricular diastolic parameters are indeterminate.  The average left ventricular global longitudinal strain is 13.1 %. The  global longitudinal strain is abnormal.   2. Right ventricular systolic function is normal. The right ventricular  size is normal.  Tricuspid regurgitation signal is inadequate for assessing  PA pressure.   3. The mitral valve is normal in structure. Trivial mitral valve  regurgitation. No evidence of mitral stenosis.   4. The aortic valve is tricuspid. Aortic valve regurgitation is mild.  Mild to moderate aortic valve sclerosis/calcification is present, without  any evidence of aortic stenosis.   ABI 08/23/2020:  This exam reveals mildly decreased perfusion of the right lower extremity, noted at the anterior tibial and post tibial artery level (ABI 0.88).  There is severely abnormal monophasic waveform at the right ankle. 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    This exam reveals mildly decreased perfusion of the left  lower extremity, noted at the anterior tibial and post tibial artery level (ABI 0.88) with mildly abnormal biphasic waveform at the left ankle.   PCV MYOCARDIAL PERFUSION WITH LEXISCAN 12/13/2020 1 Day Rest/Stress Protocol. Stress EKG is non-diagnostic, as this is pharmacological stress test using Lexiscan. No convincing evidence of reversible myocardial ischemia or prior infarct. Left ventricular ejection fraction is 51% , visually appears normal. Left ventricular wall thickness is preserved without regional wall motion abnormalities. Low risk study.  EKG:  04/03/2021: Sinus rhythm with frequent PACs at a rate of 50 bpm.  Left atrial enlargement.  Normal axis.  Right bundle branch block.  Compared to EKG 08/06/2020, no significant change.  EKG 05/21/2012: Normal sinus rhythm at a rate of 56 bpm.  Incomplete right bundle branch block.  Otherwise normal.  No evidence of ischemia or injury pattern.  Assessment     ICD-10-CM   1. Dizziness  R42 EKG 12-Lead    Brain natriuretic peptide    2. Dyspnea on exertion  R06.09 Brain natriuretic peptide       Medications Discontinued During This Encounter  Medication Reason   tadalafil (CIALIS) 10 MG tablet Error   triamcinolone (NASACORT) 55 MCG/ACT AERO nasal inhaler Error    No orders of the defined types were placed in this encounter.  Orders Placed This Encounter  Procedures   Brain natriuretic peptide   EKG 12-Lead    Recommendations:   Chad Miller is a 80 y.o. AA male with history of hypertension, hyperlipidemia, and chronic pain syndrome managed by Dr. Ernestina Patches.  Patient was seen in our office by Dr. Einar Gip in 2014 for dizziness.  He was then referred back to our office by PCP (at patient's request) for further evaluation and management of hypertension and hyperlipidemia.   Patient presents for urgent visit with complaints of dizziness, this is similar to what he has complained about at multiple visits prior to today.  He was last  seen in our office 01/01/2021 at which time patient was stable from a cardiovascular standpoint and no changes were made.  Patient is not orthostatic in the office today and blood pressure is stable.  Again discussed the importance of conservative measures including staying well-hydrated and sitting on the edge of the bed prior to changing positions furniture to furniture prior to changing positions.  Patient is anxious regarding his health, this anxiety appears to be ongoing as he has had similar complaints in the past.  Advised patient to follow-up with PCP for evaluation of noncardiovascular etiologies.  Plan for repeat echocardiogram in February 2023 now that patient is on beta-blocker therapy.  Stress test was overall low risk, therefore do not suspect ischemic etiology of patient's ectopy.  No changes are made at today's office visit.  Reassured patient.  Follow-up in 3 months as previously scheduled.  Rayford Halsted, PA-C 04/03/2021, 3:01 PM Office: [phone]

## 2021-04-09 ENCOUNTER — Other Ambulatory Visit: Payer: Self-pay | Admitting: Internal Medicine

## 2021-04-19 ENCOUNTER — Ambulatory Visit: Payer: Medicare Other | Admitting: Internal Medicine

## 2021-04-19 ENCOUNTER — Ambulatory Visit: Payer: Medicare Other

## 2021-04-23 ENCOUNTER — Telehealth: Payer: Medicare Other

## 2021-04-24 ENCOUNTER — Ambulatory Visit (INDEPENDENT_AMBULATORY_CARE_PROVIDER_SITE_OTHER): Payer: Medicare Other | Admitting: Internal Medicine

## 2021-04-24 ENCOUNTER — Other Ambulatory Visit: Payer: Self-pay

## 2021-04-24 ENCOUNTER — Encounter: Payer: Self-pay | Admitting: Internal Medicine

## 2021-04-24 VITALS — BP 136/80 | HR 69 | Temp 98.2°F | Ht 70.0 in | Wt 151.4 lb

## 2021-04-24 DIAGNOSIS — E7800 Pure hypercholesterolemia, unspecified: Secondary | ICD-10-CM

## 2021-04-24 DIAGNOSIS — N182 Chronic kidney disease, stage 2 (mild): Secondary | ICD-10-CM | POA: Diagnosis not present

## 2021-04-24 DIAGNOSIS — R42 Dizziness and giddiness: Secondary | ICD-10-CM

## 2021-04-24 DIAGNOSIS — I129 Hypertensive chronic kidney disease with stage 1 through stage 4 chronic kidney disease, or unspecified chronic kidney disease: Secondary | ICD-10-CM | POA: Diagnosis not present

## 2021-04-24 DIAGNOSIS — M48062 Spinal stenosis, lumbar region with neurogenic claudication: Secondary | ICD-10-CM

## 2021-04-24 DIAGNOSIS — I1310 Hypertensive heart and chronic kidney disease without heart failure, with stage 1 through stage 4 chronic kidney disease, or unspecified chronic kidney disease: Secondary | ICD-10-CM | POA: Insufficient documentation

## 2021-04-24 MED ORDER — OLMESARTAN MEDOXOMIL 20 MG PO TABS: 20.0000 mg | ORAL_TABLET | Freq: Every morning | ORAL | 2 refills | Status: DC

## 2021-04-24 MED ORDER — ATORVASTATIN CALCIUM 10 MG PO TABS: 10.0000 mg | ORAL_TABLET | Freq: Every day | ORAL | 2 refills | Status: DC

## 2021-04-24 NOTE — Progress Notes (Signed)
I,Katawbba Wiggins,acting as a Education administrator for [name], MD.,have documented all relevant documentation on the behalf of [name], MD,as directed by  [name], MD while in the presence of [name], MD.   This visit occurred during the SARS-CoV-2 public health emergency.  Safety protocols were in place, including screening questions prior to the visit, additional usage of staff PPE, and extensive cleaning of exam room while observing appropriate contact time as indicated for disinfecting solutions.  Subjective:     Patient ID: Chad Miller , male    DOB: 1940/10/04 , 80 y.o.   MRN: [id number]   Chief Complaint  Patient presents with   Hypertension   Hyperlipidemia     HPI  He presents today for BP and cholesterol check. He reports compliance with meds. He denies headaches, chest pain and visual disturbances.   Hypertension This is a chronic problem. The current episode started more than 1 year ago. The problem has been gradually improving since onset. The problem is controlled. Pertinent negatives include no blurred vision, chest pain or shortness of breath. Risk factors for coronary artery disease include dyslipidemia and male gender. Past treatments include angiotensin blockers. Hypertensive end-organ damage includes kidney disease. Identifiable causes of hypertension include chronic renal disease.  Hyperlipidemia This is a chronic problem. The current episode started more than 1 year ago. The problem is controlled. Exacerbating diseases include chronic renal disease. Pertinent negatives include no chest pain or shortness of breath. Current antihyperlipidemic treatment includes statins. The current treatment provides moderate improvement of lipids. Compliance problems include adherence to exercise.  Risk factors for coronary artery disease include dyslipidemia, hypertension and male sex.    Past Medical History:  Diagnosis Date   High blood pressure    High  cholesterol    Loss of vision    Right     Family History  Problem Relation Age of Onset   Heart disease Mother    Lung cancer Father    Asthma Sister    Heart disease Brother    Heart disease Brother    Allergic rhinitis Neg Hx    Eczema Neg Hx    Urticaria Neg Hx      Current Outpatient Medications:    amLODipine (NORVASC) 5 MG tablet, Take 1 tablet (5 mg total) by mouth every evening., Disp: 90 tablet, Rfl: 3   atorvastatin (LIPITOR) 10 MG tablet, Take 1 tablet (10 mg total) by mouth daily., Disp: 90 tablet, Rfl: 2   azelastine (ASTELIN) 0.1 % nasal spray, Place 2 sprays into both nostrils 2 (two) times daily. Use in each nostril as directed, Disp: 30 mL, Rfl: 5   co-enzyme Q-10 30 MG capsule, Take 30 mg by mouth daily., Disp: , Rfl:    loratadine (CLARITIN) 10 MG tablet, Take 10 mg by mouth daily., Disp: , Rfl:    Melatonin 5 MG CAPS, Take by mouth., Disp: , Rfl:    metoprolol succinate (TOPROL-XL) 25 MG 24 hr tablet, Take 1 tablet (25 mg total) by mouth daily. Take with or immediately following a meal., Disp: 90 tablet, Rfl: 1   mometasone (NASONEX) 50 MCG/ACT nasal spray, Place 2 sprays into the nose daily., Disp: , Rfl:    Multiple Vitamin (MULTIVITAMIN) tablet, Take 1 tablet by mouth daily., Disp: , Rfl:    Nutritional Supplements (EQUATE) LIQD, Take by mouth daily., Disp: , Rfl:    olmesartan (BENICAR) 20 MG tablet, Take 1 tablet (20 mg total) by mouth in  the morning., Disp: 90 tablet, Rfl: 2   Omega 3 1000 MG CAPS, Take by mouth., Disp: , Rfl:    No Known Allergies   Review of Systems  Constitutional: Negative.   Eyes:  Negative for blurred vision.  Respiratory: Negative.  Negative for shortness of breath.   Cardiovascular: Negative.  Negative for chest pain.  Gastrointestinal: Negative.   Musculoskeletal:  Positive for back pain.       He c/o chronic back pain. He has long-standing lumbar stenosis. Only takes Tylenol and Lidocaine patch prn. NO progressive  symptoms.   Neurological:  Positive for dizziness.       He c/o intermittent dizziness.  Usually occurs with positional changes. No associated cp,sob,palpitations  Psychiatric/Behavioral: Negative.    All other systems reviewed and are negative.   Today's Vitals   04/24/21 1434 04/24/21 1502  BP: 136/88 136/80  Pulse: 69   Temp: 98.2 F (36.8 C)   Weight: 151 lb 6.4 oz (68.7 kg)   Height: '5\' 10"'  (1.778 m)   PainSc: 6    PainLoc: Back    Body mass index is 21.72 kg/m.  Wt Readings from Last 3 Encounters:  04/24/21 151 lb 6.4 oz (68.7 kg)  04/03/21 143 lb (64.9 kg)  01/01/21 149 lb 9.6 oz (67.9 kg)    BP Readings from Last 3 Encounters:  04/24/21 136/80  04/03/21 134/68  01/01/21 126/62    Objective:  Physical Exam Vitals and nursing note reviewed.  Constitutional:      Appearance: Normal appearance.  HENT:     Head: Normocephalic and atraumatic.     Nose:     Comments: Masked     Mouth/Throat:     Comments: Masked  Eyes:     Extraocular Movements: Extraocular movements intact.  Cardiovascular:     Rate and Rhythm: Normal rate and regular rhythm.     Heart sounds: Normal heart sounds.  Pulmonary:     Effort: Pulmonary effort is normal.     Breath sounds: Normal breath sounds.  Musculoskeletal:     Cervical back: Normal range of motion.     Comments: Ambulatory with 3-prong cane  Skin:    General: Skin is warm.  Neurological:     General: No focal deficit present.     Mental Status: He is alert.  Psychiatric:        Mood and Affect: Mood normal.        Assessment And Plan:     1. Hypertensive nephropathy Comments: Chronic, fair control. Goal BP<130/80.  No med changes today. Encouraged to follow low sodium diet. He will f/u in 6 months for re-evaluation.  - CMP14+EGFR - Lipid panel - CBC no Diff  2. Chronic renal disease, stage II Comments: Chronic, I will check renal function today. He is encouraged to keep BP controlled and stay well hydrated to  prevent progression of CKD.   3. Pure hypercholesterolemia Comments: Chronic, currently on atorvastatin. I will check non-fasting lipid panel.  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   5. Dizziness Comments: Reminded to sit on edge of bed prior to getting up and to sit on edge of couch/chair prior to getting up. I will also check CBC today.     Patient was given opportunity to ask questions. Patient verbalized understanding of the plan and was able to repeat key elements of  the plan. All questions were answered to their satisfaction.   I, [name], MD, have reviewed all documentation for this visit. The documentation on 04/24/21 for the exam, diagnosis, procedures, and orders are all accurate and complete.   IF YOU HAVE BEEN REFERRED TO A SPECIALIST, IT MAY TAKE 1-2 WEEKS TO SCHEDULE/PROCESS THE REFERRAL. IF YOU HAVE NOT HEARD FROM US/SPECIALIST IN TWO WEEKS, PLEASE GIVE [place] A CALL AT [phone].   THE PATIENT IS ENCOURAGED TO PRACTICE SOCIAL DISTANCING DUE TO THE COVID-19 PANDEMIC.

## 2021-04-24 NOTE — Patient Instructions (Signed)
Cooking With Less Salt Cooking with less salt is one way to reduce the amount of sodium you get from food. Sodium is one of the elements that make up salt. It is found naturally in foods and is also added to certain foods. Depending on your condition and overall health, your health care provider or dietitian may recommend that you reduce your sodium intake. Most people should have less than 2,300 milligrams (mg) of sodium each day. If you have high blood pressure (hypertension), you may need to limit your sodium to 1,500 mg each day. Follow the tipsbelow to help reduce your sodium intake. What are tips for eating less sodium? Reading food labels  Check the food label before buying or using packaged ingredients. Always check the label for the serving size and sodium content. Look for products with no more than 140 mg of sodium in one serving. Check the % Daily Value column to see what percent of the daily recommended amount of sodium is provided in one serving of the product. Foods with 5% or less in this column are considered low in sodium. Foods with 20% or higher are considered high in sodium. Do not choose foods with salt as one of the first three ingredients on the ingredients list. If salt is one of the first three ingredients, it usually means the item is high in sodium.  Shopping Buy sodium-free or low-sodium products. Look for the following words on food labels: Low-sodium. Sodium-free. Reduced-sodium. No salt added. Unsalted. Always check the sodium content even if foods are labeled as low-sodium or no salt added. Buy fresh foods. Cooking Use herbs, seasonings without salt, and spices as substitutes for salt. Use sodium-free baking soda when baking. Grill, braise, or roast foods to add flavor with less salt. Avoid adding salt to pasta, rice, or hot cereals. Drain and rinse canned vegetables, beans, and meat before use. Avoid adding salt when cooking sweets and desserts. Cook with  low-sodium ingredients. What foods are high in sodium? Vegetables Regular canned vegetables (not low-sodium or reduced-sodium). Sauerkraut, pickled vegetables, and relishes. Olives. French fries. Onion rings. Regular canned tomato sauce and paste. Regular tomato and vegetable juice. Frozenvegetables in sauces. Grains Instant hot cereals. Bread stuffing, pancake, and biscuit mixes. Croutons. Seasoned rice or pasta mixes. Noodle soup cups. Boxed or frozen macaroni and cheese. Regular salted crackers. Self-rising flour. Rolls. Bagels. Flourtortillas and wraps. Meats and other proteins Meat or fish that is salted, canned, smoked, cured, spiced, or pickled. This includes bacon, ham, sausages, hot dogs, corned beef, chipped beef, meat loaves, salt pork, jerky, pickled herring, anchovies, regular canned tuna, andsardines. Salted nuts. Dairy Processed cheese and cheese spreads. Cheese curds. Blue cheese. Feta cheese.String cheese. Regular cottage cheese. Buttermilk. Canned milk. The items listed above may not be a complete list of foods high in sodium. Actual amounts of sodium may be different depending on processing. Contact a dietitian for more information. What foods are low in sodium? Fruits Fresh, frozen, or canned fruit with no sauce added. Fruit juice. Vegetables Fresh or frozen vegetables with no sauce added. "No salt added" canned vegetables. "No salt added" tomato sauce and paste. Low-sodium orreduced-sodium tomato and vegetable juice. Grains Noodles, pasta, quinoa, rice. Shredded or puffed wheat or puffed rice. Regular or quick oats (not instant). Low-sodium crackers. Low-sodium bread. Whole-grainbread and whole-grain pasta. Unsalted popcorn. Meats and other proteins Fresh or frozen whole meats, poultry (not injected with sodium), and fish with no sauce added. Unsalted nuts. Dried peas, beans, and   lentils without added salt. Unsalted canned beans. Eggs. Unsalted nut butters. Low-sodium canned  tunaor chicken. Dairy Milk. Soy milk. Yogurt. Low-sodium cheeses, such as Swiss, Monterey Jack, mozzarella, and ricotta. Sherbet or ice cream (keep to  cup per serving).Cream cheese. Fats and oils Unsalted butter or margarine. Other foods Homemade pudding. Sodium-free baking soda and baking powder. Herbs and spices.Low-sodium seasoning mixes. Beverages Coffee and tea. Carbonated beverages. The items listed above may not be a complete list of foods low in sodium. Actual amounts of sodium may be different depending on processing. Contact a dietitian for more information. What are some salt alternatives when cooking? The following are herbs, seasonings, and spices that can be used instead of salt to flavor your food. Herbs should be fresh or dried. Do not choose packaged mixes. Next to the name of the herb, spice, or seasoning aresome examples of foods you can pair it with. Herbs [place] leaves - Soups, meat and vegetable dishes, and spaghetti sauce. Basil - Italian dishes, soups, pasta, and fish dishes. Cilantro - Meat, poultry, and vegetable dishes. Chili powder - Marinades and Mexican dishes. Chives - Salad dressings and potato dishes. Cumin - Mexican dishes, couscous, and meat dishes. Dill - Fish dishes, sauces, and salads. Fennel - Meat and vegetable dishes, breads, and cookies. Garlic (do not use garlic salt) - Italian dishes, meat dishes, salad dressings, and sauces. Marjoram - Soups, potato dishes, and meat dishes. Oregano - Pizza and spaghetti sauce. Parsley - Salads, soups, pasta, and meat dishes. Rosemary - Italian dishes, salad dressings, soups, and red meats. Saffron - Fish dishes, pasta, and some poultry dishes. Sage - Stuffings and sauces. Tarragon - Fish and poultry dishes. Thyme - Stuffing, meat, and fish dishes. Seasonings Lemon juice - Fish dishes, poultry dishes, vegetables, and salads. Vinegar - Salad dressings, vegetables, and fish dishes. Spices Cinnamon - Sweet  dishes, such as cakes, cookies, and puddings. Cloves - Gingerbread, puddings, and marinades for meats. Curry - Vegetable dishes, fish and poultry dishes, and stir-fry dishes. Ginger - Vegetable dishes, fish dishes, and stir-fry dishes. Nutmeg - Pasta, vegetables, poultry, fish dishes, and custard. Summary Cooking with less salt is one way to reduce the amount of sodium that you get from food. Buy sodium-free or low-sodium products. Check the food label before using or buying packaged ingredients. Use herbs, seasonings without salt, and spices as substitutes for salt in foods. This information is not intended to replace advice given to you by your health care provider. Make sure you discuss any questions you have with your healthcare provider. Document Revised: 04/28/2019 Document Reviewed: 04/28/2019 Elsevier Patient Education  2022 Elsevier Inc.  

## 2021-04-25 ENCOUNTER — Other Ambulatory Visit: Payer: Self-pay

## 2021-04-25 LAB — CMP14+EGFR
ALT: 23 IU/L (ref 0–44)
AST: 24 IU/L (ref 0–40)
Albumin/Globulin Ratio: 1.4 (ref 1.2–2.2)
Albumin: 4.4 g/dL (ref 3.6–4.6)
Alkaline Phosphatase: 78 IU/L (ref 44–121)
BUN/Creatinine Ratio: 17 (ref 10–24)
BUN: 16 mg/dL (ref 8–27)
Bilirubin Total: 1.2 mg/dL (ref 0.0–1.2)
CO2: 24 mmol/L (ref 20–29)
Calcium: 9.5 mg/dL (ref 8.6–10.2)
Chloride: 103 mmol/L (ref 96–106)
Creatinine, Ser: 0.95 mg/dL (ref 0.76–1.27)
Globulin, Total: 3.2 g/dL (ref 1.5–4.5)
Glucose: 93 mg/dL (ref 70–99)
Potassium: 4.8 mmol/L (ref 3.5–5.2)
Sodium: 139 mmol/L (ref 134–144)
Total Protein: 7.6 g/dL (ref 6.0–8.5)
eGFR: 80 mL/min/{1.73_m2} (ref >59)

## 2021-04-25 LAB — CBC
Hematocrit: 42.5 % (ref 37.5–51.0)
Hemoglobin: 14.8 g/dL (ref 13.0–17.7)
MCH: 32.5 pg (ref 26.6–33.0)
MCHC: 34.8 g/dL (ref 31.5–35.7)
MCV: 93 fL (ref 79–97)
Platelets: 222 10*3/uL (ref 150–450)
RBC: 4.55 x10E6/uL (ref 4.14–5.80)
RDW: 12.4 % (ref 11.6–15.4)
WBC: 3.9 10*3/uL (ref 3.4–10.8)

## 2021-04-25 LAB — LIPID PANEL
Chol/HDL Ratio: 2.5 ratio (ref 0.0–5.0)
Cholesterol, Total: 147 mg/dL (ref 100–199)
HDL: 59 mg/dL (ref >39)
LDL Chol Calc (NIH): 78 mg/dL (ref 0–99)
Triglycerides: 43 mg/dL (ref 0–149)
VLDL Cholesterol Cal: 10 mg/dL (ref 5–40)

## 2021-04-25 MED ORDER — OLMESARTAN MEDOXOMIL 20 MG PO TABS: 20.0000 mg | ORAL_TABLET | Freq: Every morning | ORAL | 2 refills | Status: DC

## 2021-04-27 ENCOUNTER — Telehealth: Payer: Medicare Other

## 2021-05-02 ENCOUNTER — Ambulatory Visit (INDEPENDENT_AMBULATORY_CARE_PROVIDER_SITE_OTHER): Payer: Medicare Other

## 2021-05-02 VITALS — Ht 70.0 in | Wt 160.0 lb

## 2021-05-02 DIAGNOSIS — Z0000 Encounter for general adult medical examination without abnormal findings: Secondary | ICD-10-CM

## 2021-05-02 NOTE — Patient Instructions (Signed)
[name] , Thank you for taking time to come for your Medicare Wellness Visit. I appreciate your ongoing commitment to your health goals. Please review the following plan we discussed and let me know if I can assist you in the future.   Screening recommendations/referrals: Colonoscopy: not required Recommended yearly ophthalmology/optometry visit for glaucoma screening and checkup Recommended yearly dental visit for hygiene and checkup  Vaccinations: Influenza vaccine: completed 03/29/2021 Pneumococcal vaccine: due Tdap vaccine: completed 1/23/20213, due 06/11/2021 Shingles vaccine: completed   Covid-19:  03/27/2021, 08/18/2020, 02/15/2020, 07/27/2019, 07/02/2019  Advanced directives: Please bring a copy of your POA (Power of Attorney) and/or Living Will to your next appointment.   Conditions/risks identified: none  Next appointment: Follow up in one year for your annual wellness visit.   Preventive Care 81 Years and Older, Male Preventive care refers to lifestyle choices and visits with your health care provider that can promote health and wellness. What does preventive care include? A yearly physical exam. This is also called an annual well check. Dental exams once or twice a year. Routine eye exams. Ask your health care provider how often you should have your eyes checked. Personal lifestyle choices, including: Daily care of your teeth and gums. Regular physical activity. Eating a healthy diet. Avoiding tobacco and drug use. Limiting alcohol use. Practicing safe sex. Taking low doses of aspirin every day. Taking vitamin and mineral supplements as recommended by your health care provider. What happens during an annual well check? The services and screenings done by your health care provider during your annual well check will depend on your age, overall health, lifestyle risk factors, and family history of disease. Counseling  Your health care provider may ask you questions about  your: Alcohol use. Tobacco use. Drug use. Emotional well-being. Home and relationship well-being. Sexual activity. Eating habits. History of falls. Memory and ability to understand (cognition). Work and work Astronomer. Screening  You may have the following tests or measurements: Height, weight, and BMI. Blood pressure. Lipid and cholesterol levels. These may be checked every 5 years, or more frequently if you are over 54 years old. Skin check. Lung cancer screening. You may have this screening every year starting at age 81 if you have a 30-pack-year history of smoking and currently smoke or have quit within the past 15 years. Fecal occult blood test (FOBT) of the stool. You may have this test every year starting at age 81. Flexible sigmoidoscopy or colonoscopy. You may have a sigmoidoscopy every 5 years or a colonoscopy every 10 years starting at age 81. Prostate cancer screening. Recommendations will vary depending on your family history and other risks. Hepatitis C blood test. Hepatitis B blood test. Sexually transmitted disease (STD) testing. Diabetes screening. This is done by checking your blood sugar (glucose) after you have not eaten for a while (fasting). You may have this done every 1-3 years. Abdominal aortic aneurysm (AAA) screening. You may need this if you are a current or former smoker. Osteoporosis. You may be screened starting at age 30 if you are at high risk. Talk with your health care provider about your test results, treatment options, and if necessary, the need for more tests. Vaccines  Your health care provider may recommend certain vaccines, such as: Influenza vaccine. This is recommended every year. Tetanus, diphtheria, and acellular pertussis (Tdap, Td) vaccine. You may need a Td booster every 10 years. Zoster vaccine. You may need this after age 81. Pneumococcal 13-valent conjugate (PCV13) vaccine. One dose is  recommended after age 17. Pneumococcal  polysaccharide (PPSV23) vaccine. One dose is recommended after age 81. Talk to your health care provider about which screenings and vaccines you need and how often you need them. This information is not intended to replace advice given to you by your health care provider. Make sure you discuss any questions you have with your health care provider. Document Released: 06/02/2015 Document Revised: 01/24/2016 Document Reviewed: 03/07/2015 Elsevier Interactive Patient Education  2017 [place] Prevention in the Home Falls can cause injuries. They can happen to people of all ages. There are many things you can do to make your home safe and to help prevent falls. What can I do on the outside of my home? Regularly fix the edges of walkways and driveways and fix any cracks. Remove anything that might make you trip as you walk through a door, such as a raised step or threshold. Trim any bushes or trees on the path to your home. Use bright outdoor lighting. Clear any walking paths of anything that might make someone trip, such as rocks or tools. Regularly check to see if handrails are loose or broken. Make sure that both sides of any steps have handrails. Any raised decks and porches should have guardrails on the edges. Have any leaves, snow, or ice cleared regularly. Use sand or salt on walking paths during winter. Clean up any spills in your garage right away. This includes oil or grease spills. What can I do in the bathroom? Use night lights. Install grab bars by the toilet and in the tub and shower. Do not use towel bars as grab bars. Use non-skid mats or decals in the tub or shower. If you need to sit down in the shower, use a plastic, non-slip stool. Keep the floor dry. Clean up any water that spills on the floor as soon as it happens. Remove soap buildup in the tub or shower regularly. Attach bath mats securely with double-sided non-slip rug tape. Do not have throw rugs and other  things on the floor that can make you trip. What can I do in the bedroom? Use night lights. Make sure that you have a light by your bed that is easy to reach. Do not use any sheets or blankets that are too big for your bed. They should not hang down onto the floor. Have a firm chair that has side arms. You can use this for support while you get dressed. Do not have throw rugs and other things on the floor that can make you trip. What can I do in the kitchen? Clean up any spills right away. Avoid walking on wet floors. Keep items that you use a lot in easy-to-reach places. If you need to reach something above you, use a strong step stool that has a grab bar. Keep electrical cords out of the way. Do not use floor polish or wax that makes floors slippery. If you must use wax, use non-skid floor wax. Do not have throw rugs and other things on the floor that can make you trip. What can I do with my stairs? Do not leave any items on the stairs. Make sure that there are handrails on both sides of the stairs and use them. Fix handrails that are broken or loose. Make sure that handrails are as long as the stairways. Check any carpeting to make sure that it is firmly attached to the stairs. Fix any carpet that is loose or worn. Avoid having  throw rugs at the top or bottom of the stairs. If you do have throw rugs, attach them to the floor with carpet tape. Make sure that you have a light switch at the top of the stairs and the bottom of the stairs. If you do not have them, ask someone to add them for you. What else can I do to help prevent falls? Wear shoes that: Do not have high heels. Have rubber bottoms. Are comfortable and fit you well. Are closed at the toe. Do not wear sandals. If you use a stepladder: Make sure that it is fully opened. Do not climb a closed stepladder. Make sure that both sides of the stepladder are locked into place. Ask someone to hold it for you, if possible. Clearly  mark and make sure that you can see: Any grab bars or handrails. First and last steps. Where the edge of each step is. Use tools that help you move around (mobility aids) if they are needed. These include: Canes. Walkers. Scooters. Crutches. Turn on the lights when you go into a dark area. Replace any light bulbs as soon as they burn out. Set up your furniture so you have a clear path. Avoid moving your furniture around. If any of your floors are uneven, fix them. If there are any pets around you, be aware of where they are. Review your medicines with your doctor. Some medicines can make you feel dizzy. This can increase your chance of falling. Ask your doctor what other things that you can do to help prevent falls. This information is not intended to replace advice given to you by your health care provider. Make sure you discuss any questions you have with your health care provider. Document Released: 03/02/2009 Document Revised: 10/12/2015 Document Reviewed: 06/10/2014 Elsevier Interactive Patient Education  2017 [employer].

## 2021-05-02 NOTE — Progress Notes (Signed)
I connected with Chad Miller today by telephone and verified that I am speaking with the correct person using two identifiers. Location patient: home Location provider: work Persons participating in the virtual visit: Satya Buttram, Elisha Ponder LPN.   I discussed the limitations, risks, security and privacy concerns of performing an evaluation and management service by telephone and the availability of in person appointments. I also discussed with the patient that there may be a patient responsible charge related to this service. The patient expressed understanding and verbally consented to this telephonic visit.    Interactive audio and video telecommunications were attempted between this provider and patient, however failed, due to patient having technical difficulties OR patient did not have access to video capability.  We continued and completed visit with audio only.     Vital signs may be patient reported or missing.  Subjective:   Chad Miller is a 80 y.o. male who presents for Medicare Annual/Subsequent preventive examination.  Review of Systems     Cardiac Risk Factors include: advanced age (>41men, >59 women);dyslipidemia;male gender     Objective:    Today's Vitals   05/02/21 1211  Weight: 160 lb (72.6 kg)  Height:  (1.778 m)  PainSc: 5    Body mass index is 22.96 kg/m.  Advanced Directives 05/02/2021 11/02/2020 04/12/2020 04/01/2019  Does Patient Have a Medical Advance Directive? Yes Yes Yes Yes  Type of Estate agent of [place];Living will Living will;Healthcare Power of [employer] Power of [place];Living will Healthcare Power of [place];Living will  Does patient want to make changes to medical advance directive? - No - Patient declined - -  Copy of Healthcare Power of Attorney in Chart? No - copy requested No - copy requested No - copy requested No - copy requested    Current Medications (verified) Outpatient  Encounter Medications as of 05/02/2021  Medication Sig   amLODipine (NORVASC) 5 MG tablet Take 1 tablet (5 mg total) by mouth every evening.   atorvastatin (LIPITOR) 10 MG tablet Take 1 tablet (10 mg total) by mouth daily.   azelastine (ASTELIN) 0.1 % nasal spray Place 2 sprays into both nostrils 2 (two) times daily. Use in each nostril as directed   co-enzyme Q-10 30 MG capsule Take 30 mg by mouth daily.   loratadine (CLARITIN) 10 MG tablet Take 10 mg by mouth daily.   Melatonin 5 MG CAPS Take by mouth.   metoprolol succinate (TOPROL-XL) 25 MG 24 hr tablet Take 1 tablet (25 mg total) by mouth daily. Take with or immediately following a meal.   Multiple Vitamin (MULTIVITAMIN) tablet Take 1 tablet by mouth daily.   Nutritional Supplements (EQUATE) LIQD Take by mouth daily.   olmesartan (BENICAR) 20 MG tablet Take 1 tablet (20 mg total) by mouth in the morning.   Omega 3 1000 MG CAPS Take by mouth.   mometasone (NASONEX) 50 MCG/ACT nasal spray Place 2 sprays into the nose daily. (Patient not taking: Reported on 05/02/2021)   No facility-administered encounter medications on file as of 05/02/2021.    Allergies (verified) Patient has no known allergies.   History: Past Medical History:  Diagnosis Date   High blood pressure    High cholesterol    Loss of vision    Right   Past Surgical History:  Procedure Laterality Date   CATARACT EXTRACTION     REPLACEMENT TOTAL KNEE Left 2004   Family History  Problem Relation Age of Onset   Heart disease Mother  Lung cancer Father    Asthma Sister    Heart disease Brother    Heart disease Brother    Allergic rhinitis Neg Hx    Eczema Neg Hx    Urticaria Neg Hx    Social History   Socioeconomic History   Marital status: Married    Spouse name: Not on file   Number of children: 2   Years of education: College   Highest education level: Not on file  Occupational History   Occupation: Retired  Tobacco Use   Smoking status: Never    Smokeless tobacco: Never  Vaping Use   Vaping Use: Never used  Substance and Sexual Activity   Alcohol use: No    Comment: Quit 2015   Drug use: No   Sexual activity: Yes    Partners: Female    Comment: Married  Other Topics Concern   Not on file  Social History Narrative   Lives at home w/ his wife   Right-handed   Caffeine: 1 cup per day   Social Determinants of Health   Financial Resource Strain: Low Risk    Difficulty of Paying Living Expenses: Not hard at all  Food Insecurity: No Food Insecurity   Worried About Programme researcher, broadcasting/film/video in the Last Year: Never true   Barista in the Last Year: Never true  Transportation Needs: No Transportation Needs   Lack of Transportation (Medical): No   Lack of Transportation (Non-Medical): No  Physical Activity: Inactive   Days of Exercise per Week: 0 days   Minutes of Exercise per Session: 0 min  Stress: No Stress Concern Present   Feeling of Stress : Not at all  Social Connections: Not on file    Tobacco Counseling Counseling given: Not Answered   Clinical Intake:  Pre-visit preparation completed: Yes  Pain : 0-10 Pain Score: 5  Pain Type: Chronic pain Pain Location: Back Pain Orientation: Lower Pain Onset: More than a month ago Pain Frequency: Constant     Nutritional Status: BMI of 19-24  Normal Nutritional Risks: None Diabetes: No  How often do you need to have someone help you when you read instructions, pamphlets, or other written materials from your doctor or pharmacy?: 1 - Never  Diabetic? no  Interpreter Needed?: No  Information entered by :: NAllen LPN   Activities of Daily Living In your present state of health, do you have any difficulty performing the following activities: 05/02/2021 04/24/2021  Hearing? N N  Vision? Y N  Comment decreased in right eye -  Difficulty concentrating or making decisions? N N  Walking or climbing stairs? Y Y  Dressing or bathing? N N  Doing errands,  shopping? N N  Preparing Food and eating ? N -  Using the Toilet? N -  In the past six months, have you accidently leaked urine? N -  Do you have problems with loss of bowel control? N -  Managing your Medications? N -  Managing your Finances? N -  Housekeeping or managing your Housekeeping? N -  Some recent data might be hidden    Patient Care Team: [name], MD as PCP - General (Internal Medicine) Clarene Duke, Karma Lew, RN as [facility] Care Management  Indicate any recent Medical Services you may have received from other than [facility] providers in the past year (date may be approximate).     Assessment:   This is a routine wellness examination for Chad Miller.  Hearing/Vision screen  Vision Screening - Comments:: Regular eye exams, Dr. Charlotte Sanes  Dietary issues and exercise activities discussed: Current Exercise Habits: The patient does not participate in regular exercise at present   Goals Addressed             This Visit's Progress    Patient Stated       05/02/2021, continue to go to doctor to mange problems       Depression Screen PHQ 2/9 Scores 05/02/2021 04/24/2021 04/12/2020 04/01/2019 07/15/2018  PHQ - 2 Score 0 0 0 0 0  PHQ- 9 Score - - - 3 -    Fall Risk Fall Risk  05/02/2021 04/24/2021 04/12/2020 04/01/2019 04/01/2019  Falls in the past year? 1 1 1  0 0  Comment lost balance and vertigo - lose balance - -  Number falls in past yr: 1 1 1  - -  Comment - - - - -  Injury with Fall? 0 0 0 - -  Comment - patient said he trips and falls - - -  Risk for fall due to : History of fall(s);Impaired balance/gait;Impaired mobility;Medication side effect Impaired balance/gait Impaired mobility;Medication side effect Impaired vision;Medication side effect -  Follow up Falls evaluation completed;Education provided;Falls prevention discussed - Falls evaluation completed;Education provided;Falls prevention discussed Falls evaluation completed;Education provided;Falls  prevention discussed -    FALL RISK PREVENTION PERTAINING TO THE HOME:  Any stairs in or around the home? Yes  If so, are there any without handrails? No  Home free of loose throw rugs in walkways, pet beds, electrical cords, etc? Yes  Adequate lighting in your home to reduce risk of falls? Yes   ASSISTIVE DEVICES UTILIZED TO PREVENT FALLS:  Life alert? No  Use of a cane, walker or w/c? Yes  Grab bars in the bathroom? Yes  Shower chair or bench in shower? Yes  Elevated toilet seat or a handicapped toilet? Yes   TIMED UP AND GO:  Was the test performed? No .      Cognitive Function:     6CIT Screen 05/02/2021 04/12/2020 04/01/2019  What Year? 0 points 0 points 0 points  What month? 0 points 0 points 0 points  What time? 0 points 3 points 3 points  Count back from 20 0 points 0 points 0 points  Months in reverse 0 points 0 points 0 points  Repeat phrase 2 points 2 points 2 points  Total Score 2 5 5     Immunizations Immunization History  Administered Date(s) Administered   Fluad Quad(high Dose 65+) 01/21/2020, 03/29/2021   Influenza, High Dose Seasonal PF 01/28/2017, 02/04/2019   Influenza-Unspecified 01/18/2013, 01/18/2018, 02/04/2019   PFIZER Comirnaty(Gray Top)Covid-19 Tri-Sucrose Vaccine 08/18/2020   PFIZER(Purple Top)SARS-COV-2 Vaccination 07/02/2019, 07/27/2019, 02/15/2020   Pfizer Covid-19 Vaccine Bivalent Booster 14yrs & up 03/27/2021   Pneumococcal Polysaccharide-23 04/30/2010, 03/18/2016   Zoster Recombinat (Shingrix) 05/19/2017, 07/21/2017    TDAP status: Up to date  Flu Vaccine status: Up to date  Pneumococcal vaccine status: Due, Education has been provided regarding the importance of this vaccine. Advised may receive this vaccine at local pharmacy or Health Dept. Aware to provide a copy of the vaccination record if obtained from local pharmacy or Health Dept. Verbalized acceptance and understanding.  Covid-19 vaccine status: Completed  vaccines  Qualifies for Shingles Vaccine? Yes   Zostavax completed No   Shingrix Completed?: No.    Education has been provided regarding the importance of this vaccine. Patient has been advised to call insurance company to determine out  of pocket expense if they have not yet received this vaccine. Advised may also receive vaccine at local pharmacy or Health Dept. Verbalized acceptance and understanding.  Screening Tests Health Maintenance  Topic Date Due   Pneumonia Vaccine 59+ Years old (3 - PCV) 03/18/2017   TETANUS/TDAP  06/11/2021   INFLUENZA VACCINE  Completed   COVID-19 Vaccine  Completed   Zoster Vaccines- Shingrix  Completed   HPV VACCINES  Aged Out    Health Maintenance  Health Maintenance Due  Topic Date Due   Pneumonia Vaccine 34+ Years old (3 - PCV) 03/18/2017    Colorectal cancer screening: No longer required.   Lung Cancer Screening: (Low Dose CT Chest recommended if Age 37-80 years, 30 pack-year currently smoking OR have quit w/in 15years.) does not qualify.   Lung Cancer Screening Referral: no  Additional Screening:  Hepatitis C Screening: does not qualify;   Vision Screening: Recommended annual ophthalmology exams for early detection of glaucoma and other disorders of the eye. Is the patient up to date with their annual eye exam?  Yes  Who is the provider or what is the name of the office in which the patient attends annual eye exams? Dr. Charlotte Sanes If pt is not established with a provider, would they like to be referred to a provider to establish care? No .   Dental Screening: Recommended annual dental exams for proper oral hygiene  Community Resource Referral / Chronic Care Management: CRR required this visit?  No   CCM required this visit?  No      Plan:     I have personally reviewed and noted the following in the patients chart:   Medical and social history Use of alcohol, tobacco or illicit drugs  Current medications and supplements  including opioid prescriptions. Patient is not currently taking opioid prescriptions. Functional ability and status Nutritional status Physical activity Advanced directives List of other physicians Hospitalizations, surgeries, and ER visits in previous 12 months Vitals Screenings to include cognitive, depression, and falls Referrals and appointments  In addition, I have reviewed and discussed with patient certain preventive protocols, quality metrics, and best practice recommendations. A written personalized care plan for preventive services as well as general preventive health recommendations were provided to patient.     Barb Merino, LPN   85/[id number]/9311   Nurse Notes: none

## 2021-05-08 DIAGNOSIS — H5711 Ocular pain, right eye: Secondary | ICD-10-CM | POA: Diagnosis not present

## 2021-05-08 DIAGNOSIS — H5202 Hypermetropia, left eye: Secondary | ICD-10-CM | POA: Diagnosis not present

## 2021-05-08 DIAGNOSIS — H2512 Age-related nuclear cataract, left eye: Secondary | ICD-10-CM | POA: Diagnosis not present

## 2021-06-01 ENCOUNTER — Telehealth: Payer: Medicare Other

## 2021-06-07 DIAGNOSIS — N401 Enlarged prostate with lower urinary tract symptoms: Secondary | ICD-10-CM | POA: Diagnosis not present

## 2021-06-07 DIAGNOSIS — R350 Frequency of micturition: Secondary | ICD-10-CM | POA: Diagnosis not present

## 2021-06-07 DIAGNOSIS — N5201 Erectile dysfunction due to arterial insufficiency: Secondary | ICD-10-CM | POA: Diagnosis not present

## 2021-06-15 ENCOUNTER — Telehealth: Payer: Medicare Other

## 2021-06-15 ENCOUNTER — Other Ambulatory Visit: Payer: Self-pay | Admitting: Internal Medicine

## 2021-06-15 ENCOUNTER — Ambulatory Visit (INDEPENDENT_AMBULATORY_CARE_PROVIDER_SITE_OTHER): Payer: Medicare Other

## 2021-06-15 DIAGNOSIS — I739 Peripheral vascular disease, unspecified: Secondary | ICD-10-CM

## 2021-06-15 DIAGNOSIS — E7800 Pure hypercholesterolemia, unspecified: Secondary | ICD-10-CM

## 2021-06-15 DIAGNOSIS — M48062 Spinal stenosis, lumbar region with neurogenic claudication: Secondary | ICD-10-CM

## 2021-06-15 DIAGNOSIS — I129 Hypertensive chronic kidney disease with stage 1 through stage 4 chronic kidney disease, or unspecified chronic kidney disease: Secondary | ICD-10-CM

## 2021-06-18 NOTE — Chronic Care Management (AMB) (Signed)
Chronic Care Management   CCM RN Visit Note  06/15/2021 Name: Chad Miller MRN: [id number] DOB: 09-22-1940  Subjective: Chad Miller is a 81 y.o. year old male who is a primary care patient of [name], MD. The care management team was consulted for assistance with disease management and care coordination needs.    Engaged with patient by telephone for follow up visit in response to provider referral for case management and/or care coordination services.   Consent to Services:  The patient was given information about Chronic Care Management services, agreed to services, and gave verbal consent prior to initiation of services.  Please see initial visit note for detailed documentation.   Patient agreed to services and verbal consent obtained.   Assessment: Review of patient past medical history, allergies, medications, health status, including review of consultants reports, laboratory and other test data, was performed as part of comprehensive evaluation and provision of chronic care management services.   SDOH (Social Determinants of Health) assessments and interventions performed:  Yes, no acute needs   CCM Care Plan  No Known Allergies  Outpatient Encounter Medications as of 06/15/2021  Medication Sig   amLODipine (NORVASC) 5 MG tablet Take 1 tablet (5 mg total) by mouth every evening.   atorvastatin (LIPITOR) 10 MG tablet Take 1 tablet (10 mg total) by mouth daily.   azelastine (ASTELIN) 0.1 % nasal spray Place 2 sprays into both nostrils 2 (two) times daily. Use in each nostril as directed   co-enzyme Q-10 30 MG capsule Take 30 mg by mouth daily.   loratadine (CLARITIN) 10 MG tablet Take 10 mg by mouth daily.   Melatonin 5 MG CAPS Take by mouth.   metoprolol succinate (TOPROL-XL) 25 MG 24 hr tablet Take 1 tablet (25 mg total) by mouth daily. Take with or immediately following a meal.   mometasone (NASONEX) 50 MCG/ACT nasal spray Place 2 sprays into the nose daily.  (Patient not taking: Reported on 05/02/2021)   Multiple Vitamin (MULTIVITAMIN) tablet Take 1 tablet by mouth daily.   Nutritional Supplements (EQUATE) LIQD Take by mouth daily.   olmesartan (BENICAR) 20 MG tablet Take 1 tablet (20 mg total) by mouth in the morning.   Omega 3 1000 MG CAPS Take by mouth.   No facility-administered encounter medications on file as of 06/15/2021.    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    Conditions to be addressed/monitored: Hypertensive nephropathy, Pure Hypercholesterolemia, Peripheral artery disease, Spinal stenosis of lumbar region with neurogenic claudication    Care Plan : [place] of Care  Updates made by Lynne Logan, RN since 06/15/2021 12:00 AM     Problem: No plan established for management of chronic disease states (Hypertensive nephropathy, Pure Hypercholesterolemia, Peripheral artery disease, Spinal stenosis of lumbar region with neurogenic claudication)   Priority: High     Long-Range Goal: Establishment of plan of care for management of chronic disease states (Hypertensive nephropathy, Pure Hypercholesterolemia, Peripheral artery disease, Spinal stenosis of lumbar  region with neurogenic claudication)   Start Date: 06/15/2021  Expected End Date: 06/14/2022  This Visit's Progress: On track  Priority: High  Note:   Current Barriers:  Knowledge Deficits related to plan of care for management of Hypertensive nephropathy, Pure  Hypercholesterolemia, Peripheral artery disease, Spinal stenosis of lumbar region with neurogenic claudication    Chronic Disease Management support and education needs related to Hypertensive nephropathy, Pure Hypercholesterolemia, Peripheral artery disease, Spinal stenosis of lumbar region with neurogenic claudication      RNCM Clinical Goal(s):  Patient will verbalize basic understanding of  Hypertensive nephropathy, Pure Hypercholesterolemia, Peripheral artery disease, Spinal stenosis of lumbar region with neurogenic claudication    disease process and self health management plan as evidenced by patient will report having no disease exacerbations related to his chronic disease states as listed above take all medications exactly as prescribed and will call provider for medication related questions as evidenced by demonstrate improved understanding of prescribed medications and rationale for usage as evidenced by patient teach back demonstrate Improved health management independence as evidenced by patient will report 100% adherence to his prescribed treatment plan of care  continue to work with RN Care Manager to address care management and care coordination needs related to   Hypertensive nephropathy, Pure Hypercholesterolemia, Peripheral artery disease, Spinal stenosis of lumbar region with neurogenic claudication    as evidenced by adherence to CM Team Scheduled appointments demonstrate ongoing self health care management ability   as evidenced by    through collaboration with RN Care manager, provider, and care team.   Interventions: 1:1 collaboration with primary care provider regarding development and update of comprehensive plan of care as evidenced by provider attestation and co-signature Inter-disciplinary care team collaboration (see longitudinal plan of care) Evaluation of current treatment plan related to  self management and patient's adherence to plan as established by  provider   Hypertension Interventions:  (Status:  Goal on track:  Yes.) Long Term Goal Last practice recorded BP readings:  BP Readings from Last 3 Encounters:  04/24/21 136/80  04/03/21 134/68  01/01/21 126/62  Most recent eGFR/CrCl:  Lab Results  Component Value Date   EGFR 80 04/24/2021    No components found for: CRCL Evaluation of current treatment plan related to hypertension self management and patient's adherence to plan as established by provider Provided education to patient re: stroke prevention, s/s of heart attack and stroke Reviewed medications with patient and discussed importance of compliance Counseled on the importance of exercise goals with target of 150 minutes per week Advised patient, providing education and rationale, to monitor blood pressure daily and record, calling PCP for findings outside established parameters Provided education on prescribed diet low Sodium Assessed social determinant of health barriers Mailed printed educational materials related to How to accurately monitor blood pressure at home  Discussed plans with patient for ongoing care management follow up and provided patient with direct contact information for care management team  Pain Interventions:  (Status:  New goal.) Long Term Goal Evaluation of current treatment plan related to chronic pain self management and patient's adherence to plan as established by provider Pain assessment performed Medications reviewed Reviewed provider established plan for pain management Discussed importance of adherence to all scheduled medical appointments Counseled on the importance of reporting any/all new or changed pain symptoms or management strategies to pain management provider Discussed use of relaxation techniques and/or diversional activities to assist with pain reduction (distraction, imagery, relaxation, massage, acupressure, TENS, heat, and cold application  Reviewed with patient prescribed  pharmacological and nonpharmacological pain relief strategies Educated patient on the benefits of participating in PT, determined patient prefers outpatient PT and is agreeable to referral Collaborated with Dr. Baird Cancer, PCP regarding PT referral and patient's persistent pain to his lumbar spine causing interference with ability to perform daily routines Reply received from Dr. Baird Cancer advising this referral has been sent to [facility] to accommodate patient's driving distance to his home. Further, if this facility is not open for business, a new referral will be sent, per Dr. Baird Cancer. Discussed plans with patient for ongoing care management follow up and provided patient with direct contact information for care management team  Patient Goals/Self-Care Activities: Take all medications as prescribed Attend all scheduled provider appointments Call pharmacy for medication refills 3-7 days in advance of running out of medications Perform all self care activities independently  Call provider office for new concerns or questions  check blood pressure 3 times per week keep a blood pressure log take blood pressure log to all doctor appointments call doctor for signs and symptoms of high blood pressure take medications for blood pressure exactly as prescribed report new symptoms to your doctor  Follow Up Plan:  Telephone follow up appointment with care management team member scheduled for:  07/17/21      Plan:Telephone follow up appointment with care management team member scheduled for:  07/17/21  Barb Merino, RN, BSN, CCM Care Management Coordinator [facility]  Direct Phone: [phone]

## 2021-06-18 NOTE — Patient Instructions (Signed)
Visit Information ° °Thank you for taking time to visit with me today. Please don't hesitate to contact me if I can be of assistance to you before our next scheduled telephone appointment. ° °Following are the goals we discussed today:  °(Copy and paste patient goals from clinical care plan here) ° °Our next appointment is by telephone on 07/17/21 at 10:30 AM ° °Please call the care guide team at [phone] if you need to cancel or reschedule your appointment.  ° °If you are experiencing a Mental Health or Behavioral Health Crisis or need someone to talk to, please call 1-800-273-TALK (toll free, 24 hour hotline)  ° °Patient verbalizes understanding of instructions and care plan provided today and agrees to view in MyChart. Active MyChart status confirmed with patient.   ° °Laurynn Mccorvey, RN, BSN, CCM °Care Management Coordinator °[facility]  °Direct Phone: [phone] ° ° °

## 2021-06-19 ENCOUNTER — Ambulatory Visit: Payer: Self-pay

## 2021-06-19 ENCOUNTER — Telehealth: Payer: Medicare Other

## 2021-06-19 DIAGNOSIS — M48062 Spinal stenosis, lumbar region with neurogenic claudication: Secondary | ICD-10-CM

## 2021-06-19 DIAGNOSIS — E7800 Pure hypercholesterolemia, unspecified: Secondary | ICD-10-CM

## 2021-06-19 DIAGNOSIS — I129 Hypertensive chronic kidney disease with stage 1 through stage 4 chronic kidney disease, or unspecified chronic kidney disease: Secondary | ICD-10-CM

## 2021-06-19 DIAGNOSIS — I739 Peripheral vascular disease, unspecified: Secondary | ICD-10-CM

## 2021-06-20 NOTE — Chronic Care Management (AMB) (Signed)
Chronic Care Management   CCM RN Visit Note  06/19/2021 Name: Chad Miller MRN: [id number] DOB: May 13, 1941  Subjective: Chad Miller is a 81 y.o. year old male who is a primary care patient of Chad Chard, MD. The care management team was consulted for assistance with disease management and care coordination needs.    Engaged with patient by telephone for follow up visit in response to provider referral for case management and/or care coordination services.   Consent to Services:  The patient was given information about Chronic Care Management services, agreed to services, and gave verbal consent prior to initiation of services.  Please see initial visit note for detailed documentation.   Patient agreed to services and verbal consent obtained.   Assessment: Review of patient past medical history, allergies, medications, health status, including review of consultants reports, laboratory and other test data, was performed as part of comprehensive evaluation and provision of chronic care management services.   SDOH (Social Determinants of Health) assessments and interventions performed:  Yes, no acute needs   CCM Care Plan  No Known Allergies  Outpatient Encounter Medications as of 06/19/2021  Medication Sig   amLODipine (NORVASC) 5 MG tablet Take 1 tablet (5 mg total) by mouth every evening.   atorvastatin (LIPITOR) 10 MG tablet Take 1 tablet (10 mg total) by mouth daily.   azelastine (ASTELIN) 0.1 % nasal spray Place 2 sprays into both nostrils 2 (two) times daily. Use in each nostril as directed   co-enzyme Q-10 30 MG capsule Take 30 mg by mouth daily.   loratadine (CLARITIN) 10 MG tablet Take 10 mg by mouth daily.   Melatonin 5 MG CAPS Take by mouth.   metoprolol succinate (TOPROL-XL) 25 MG 24 hr tablet Take 1 tablet (25 mg total) by mouth daily. Take with or immediately following a meal.   mometasone (NASONEX) 50 MCG/ACT nasal spray Place 2 sprays into the nose daily.  (Patient not taking: Reported on 05/02/2021)   Multiple Vitamin (MULTIVITAMIN) tablet Take 1 tablet by mouth daily.   Nutritional Supplements (EQUATE) LIQD Take by mouth daily.   olmesartan (BENICAR) 20 MG tablet Take 1 tablet (20 mg total) by mouth in the morning.   Omega 3 1000 MG CAPS Take by mouth.   No facility-administered encounter medications on file as of 06/19/2021.    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    Conditions to be addressed/monitored: Hypertensive nephropathy, Pure Hypercholesterolemia, Peripheral artery disease, Spinal stenosis of lumbar region with neurogenic claudication    Care Plan : Long Neck of Care  Updates made by Lynne Logan, RN since 06/19/2021 12:00 AM     Problem: No plan established for management of chronic disease states (Hypertensive nephropathy, Pure Hypercholesterolemia, Peripheral artery disease, Spinal stenosis of lumbar region with neurogenic claudication)   Priority: High     Long-Range Goal: Establishment of plan of care for management of chronic disease states (Hypertensive nephropathy, Pure Hypercholesterolemia, Peripheral artery disease, Spinal stenosis of lumbar region  with neurogenic claudication)   Start Date: 06/15/2021  Expected End Date: 06/14/2022  Recent Progress: On track  Priority: High  Note:   Current Barriers:  Knowledge Deficits related to plan of care for management of Hypertensive nephropathy, Pure  Hypercholesterolemia, Peripheral artery disease, Spinal stenosis of lumbar region with neurogenic claudication    Chronic Disease Management support and education needs related to Hypertensive nephropathy, Pure Hypercholesterolemia, Peripheral artery disease, Spinal stenosis of lumbar region with neurogenic claudication      RNCM Clinical Goal(s):  Patient will verbalize basic understanding of  Hypertensive nephropathy, Pure Hypercholesterolemia, Peripheral artery disease, Spinal stenosis of lumbar region with neurogenic claudication    disease process and self health management plan as evidenced by patient will report having no disease exacerbations related to his chronic disease states as listed above take all medications exactly as prescribed and will call provider for medication related questions as evidenced by demonstrate improved understanding of prescribed medications and rationale for usage as evidenced by patient teach back demonstrate Improved health management independence as evidenced by patient will report 100% adherence to his prescribed treatment plan of care  continue to work with RN Care Manager to address care management and care coordination needs related to   Hypertensive nephropathy, Pure Hypercholesterolemia, Peripheral artery disease, Spinal stenosis of lumbar region with neurogenic claudication    as evidenced by adherence to CM Team Scheduled appointments demonstrate ongoing self health care management ability   as evidenced by    through collaboration with RN Care manager, provider, and care team.   Interventions: 1:1 collaboration with primary care provider regarding development and update of comprehensive plan of care as evidenced by provider attestation and co-signature Inter-disciplinary care team collaboration (see longitudinal plan of care) Evaluation of current treatment plan related to  self management and patient's adherence to plan as established by  provider  Hypertension Interventions:  (Status:  Goal on track:  Yes.) Long Term Goal Last practice recorded BP readings:  BP Readings from Last 3 Encounters:  04/24/21 136/80  04/03/21 134/68  01/01/21 126/62  Most recent eGFR/CrCl:  Lab Results  Component Value Date   EGFR 80 04/24/2021    No components found for: CRCL Evaluation of current treatment plan related to hypertension self management and patient's adherence to plan as established by provider Reviewed medications with patient and discussed importance of compliance Counseled on the importance of exercise goals with target of 150 minutes per week Advised patient, providing education and rationale, to monitor blood pressure daily and record, calling PCP for findings outside established parameters Provided education on prescribed diet low Sodium Discussed complications of poorly controlled blood pressure such as heart disease, stroke, circulatory complications, vision complications, kidney impairment, sexual dysfunction Assessed social determinant of health barriers Discussed plans with patient for ongoing care management follow up and provided patient with direct contact information for care management team  Pain Interventions:  (Status:  New goal.) Long Term Goal Evaluation of current treatment plan related to chronic pain self management and patient's adherence to plan as established by provider Pain assessment performed Medications reviewed Reviewed provider established plan for pain management Discussed importance of adherence to all scheduled medical appointments Counseled on the importance of reporting any/all new or changed pain symptoms or management strategies to pain management provider Discussed use of relaxation techniques and/or diversional activities to assist with pain reduction (distraction, imagery, relaxation, massage, acupressure, TENS, heat, and cold application Reviewed with patient prescribed  pharmacological and nonpharmacological pain relief strategies  Educated patient on the benefits of participating in PT, determined patient prefers outpatient PT and is agreeable to referral Collaborated with Dr. Baird Cancer, PCP regarding PT referral and patient's persistent pain to his lumbar spine causing interference with ability to perform daily routines Reply received from Dr. Baird Cancer advising this referral has been sent to [facility] to accommodate patient's driving distance to his home. Further, if this facility is not open for business, a new referral will be sent, per Dr. Baird Cancer. Discussed plans with patient for ongoing care management follow up and provided patient with direct contact information for care management team 06/19/21 completed successful outbound call to patient  Informed patient Dr. Baird Cancer sent his referral for outpatient PT to [place] located at [facility]  Provided patient with the contact number and address for this location and advised the referral has been authorized  Discussed plans with patient for ongoing care management follow up and provided patient with direct contact information for care management team  Patient Goals/Self-Care Activities: Take all medications as prescribed Attend all scheduled provider appointments Call pharmacy for medication refills 3-7 days in advance of running out of medications Perform all self care activities independently  Call provider office for new concerns or questions  check blood pressure 3 times per week keep a blood pressure log take blood pressure log to all doctor appointments call doctor for signs and symptoms of high blood pressure take medications for blood pressure exactly as prescribed report new symptoms to your doctor  Follow Up Plan:  Telephone follow up appointment with care management team member scheduled for:  07/17/21      Plan:Telephone follow up appointment with care  management team member scheduled for:  07/17/21  Barb Merino, RN, BSN, CCM Care Management Coordinator [facility]  Direct Phone: [phone]

## 2021-06-20 NOTE — Patient Instructions (Signed)
Visit Information  Thank you for taking time to visit with me today. Please don't hesitate to contact me if I can be of assistance to you before our next scheduled telephone appointment.  Following are the goals we discussed today:  (Copy and paste patient goals from clinical care plan here)  Our next appointment is by telephone on 07/17/21 at 10:30 AM  Please call the care guide team at [phone] if you need to cancel or reschedule your appointment.   If you are experiencing a Mental Health or [place] or need someone to talk to, please call [phone]-TALK (toll free, 24 hour hotline)   Patient verbalizes understanding of instructions and care plan provided today and agrees to view in [place]. Active MyChart status confirmed with patient.    Barb Merino, RN, BSN, CCM Care Management Coordinator [facility] [facility]  Direct Phone: [phone]

## 2021-06-30 IMAGING — MR MR LUMBAR SPINE W/O CM
4 of 5 series · 22 of 48 positions shown · non-contrast
Comparison: Lumbar MRI 07/13/2016. CT Abdomen and Pelvis
01/17/2012.

CLINICAL DATA: 77-year-old male with chronic low back pain. No
known injury. History of prostate cancer.

EXAM:
MRI LUMBAR SPINE WITHOUT CONTRAST
TECHNIQUE: Multiplanar, multisequence MR imaging of the lumbar spine was
performed. No intravenous contrast was administered.

[Series 6: T2 · sagittal · 4.0mm · 0.73mm/px · 6 of 15 slices shown (1 of 2)]
[im 1/15]
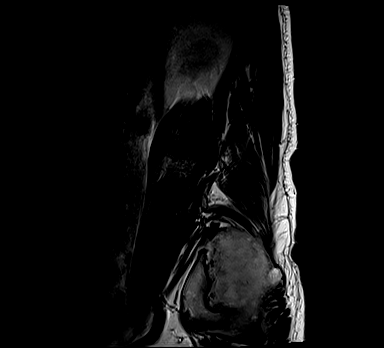
[im 3/15]
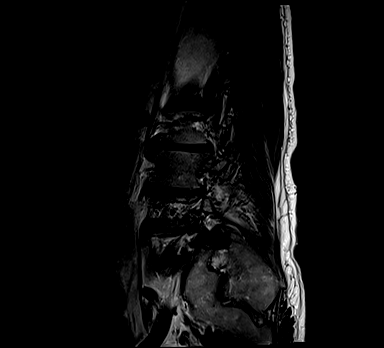
[im 6/15]
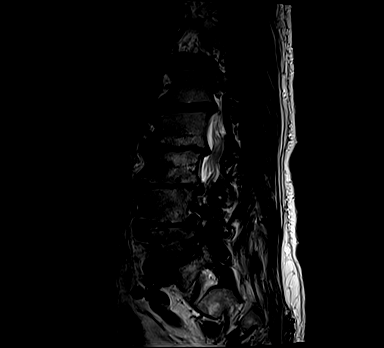
[im 9/15]
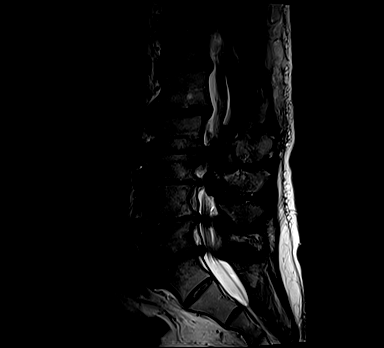
[im 12/15]
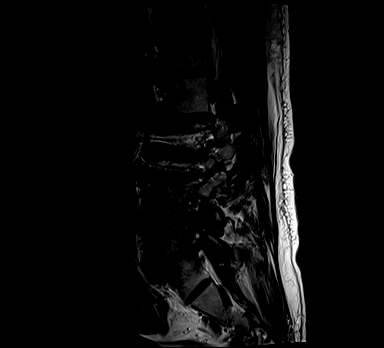
[im 15/15]
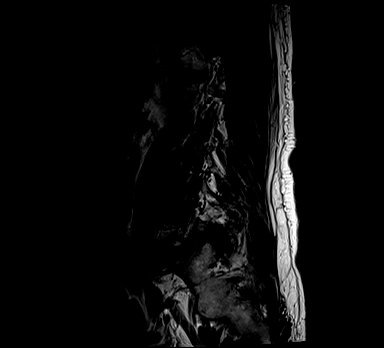

[Series 7: T1 · sagittal · 4.0mm · 0.88mm/px · 3 of 15 slices shown (1 of 2)]
[im 1/15]
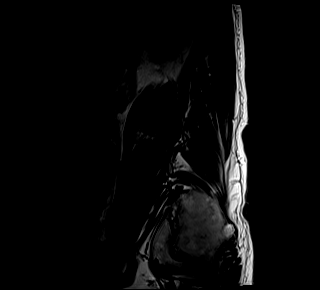
[im 8/15]
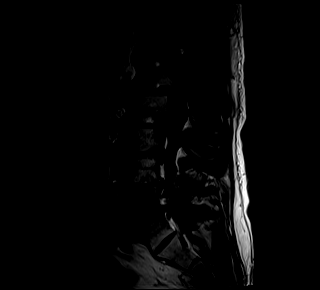
[im 15/15]
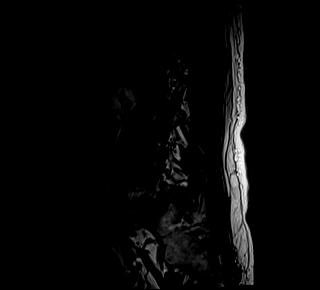

[Series 11: T1 · axial · 4.0mm · 0.35mm/px · z∈[-31,+120]mm · 3 of 43 slices shown (2 of 2)]
[im 6/43]
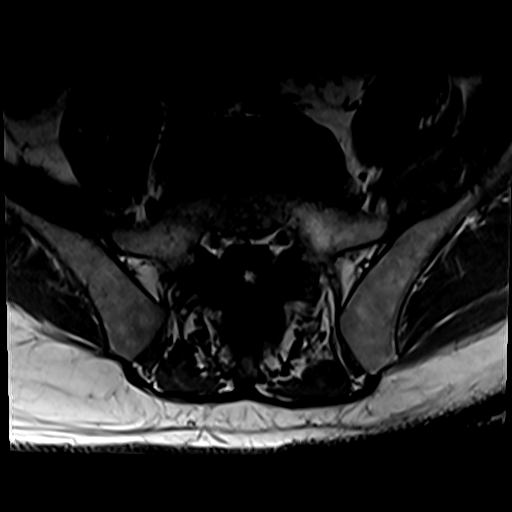
[im 23/43]
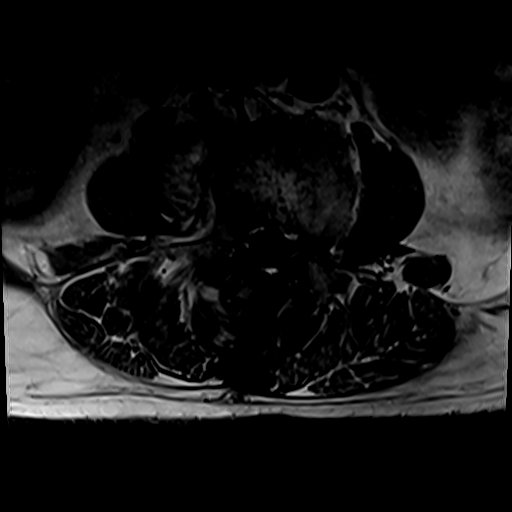
[im 37/43]
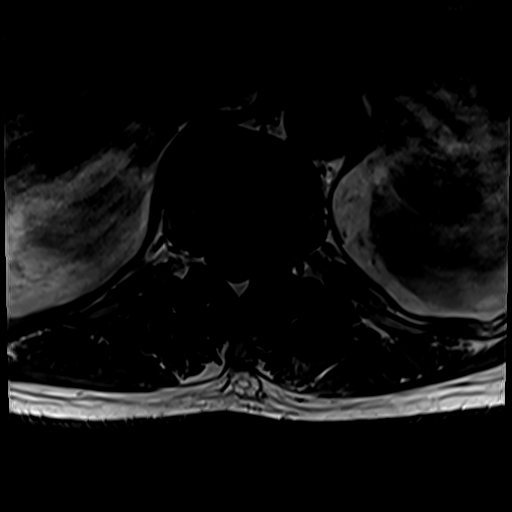

[Series 14: T2 · axial · 4.0mm · 0.35mm/px · z∈[-46,+137]mm · 10 of 43 slices shown (2 of 2)]
[im 3/43]
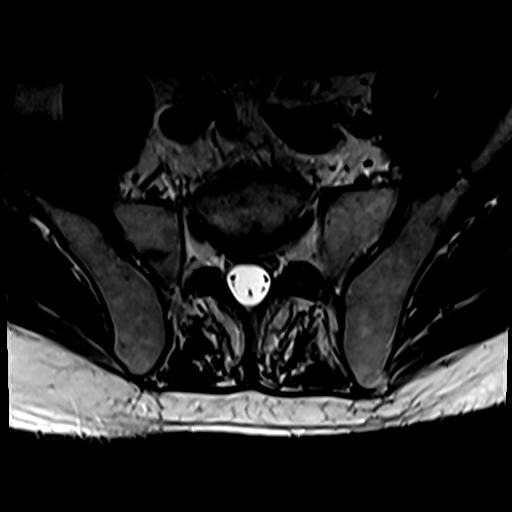
[im 6/43]
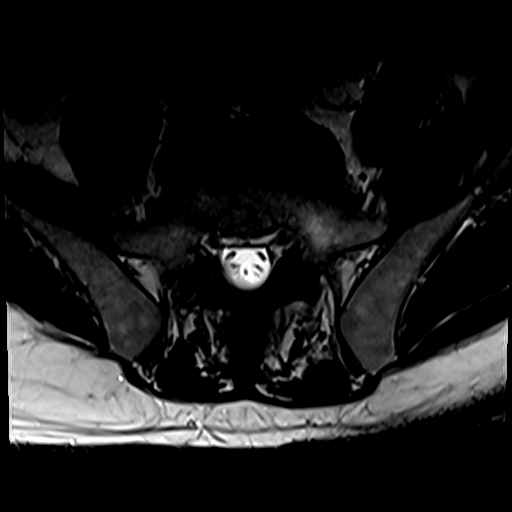
[im 9/43]
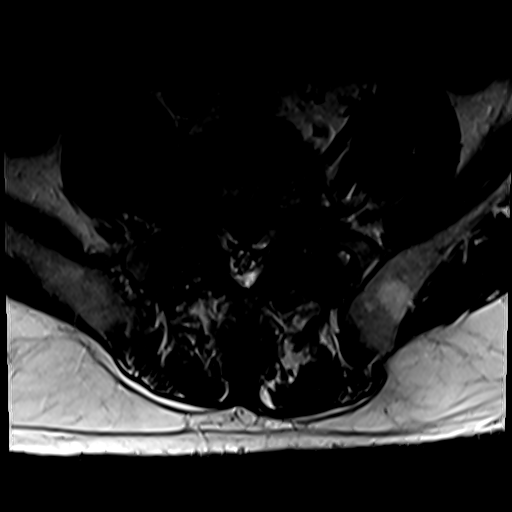
[im 15/43]
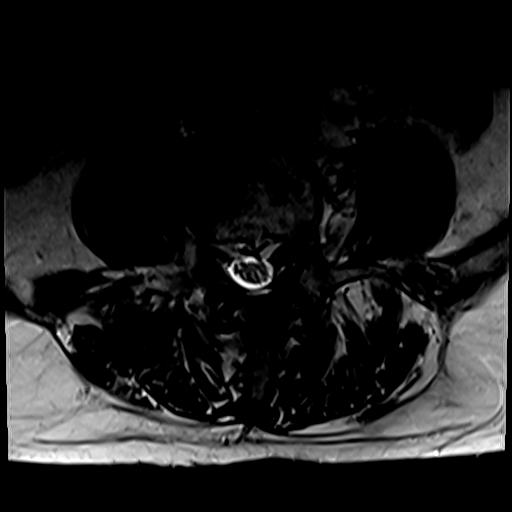
[im 20/43]
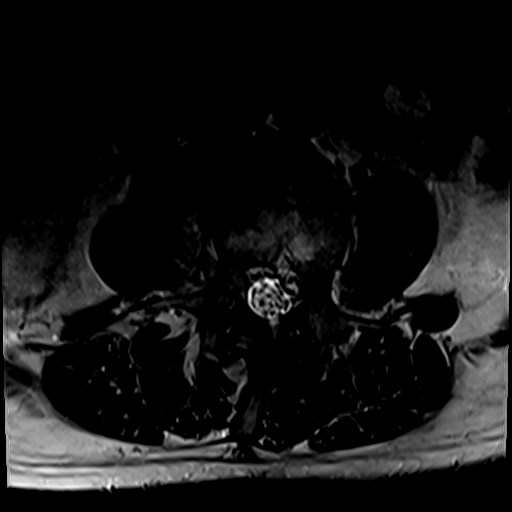
[im 23/43]
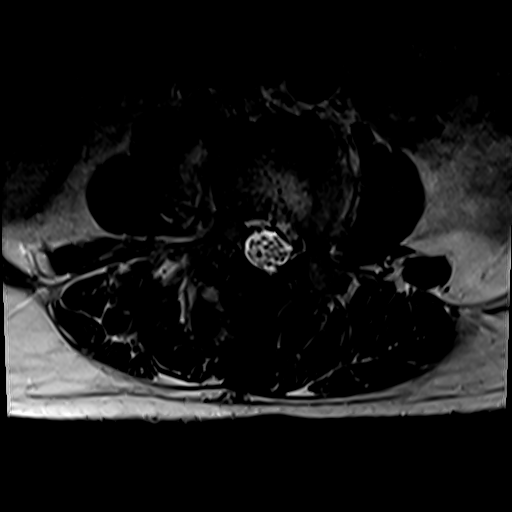
[im 26/43]
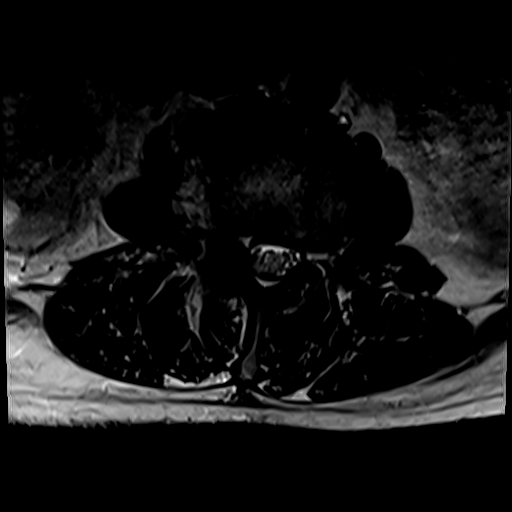
[im 31/43]
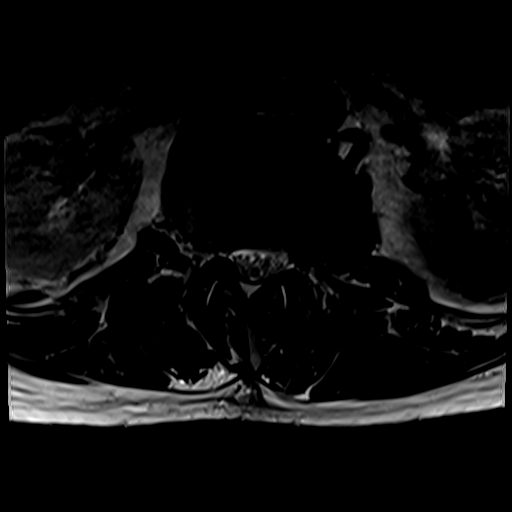
[im 37/43]
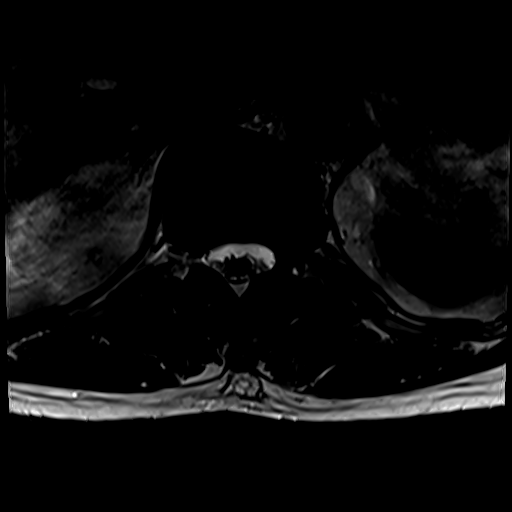
[im 43/43]
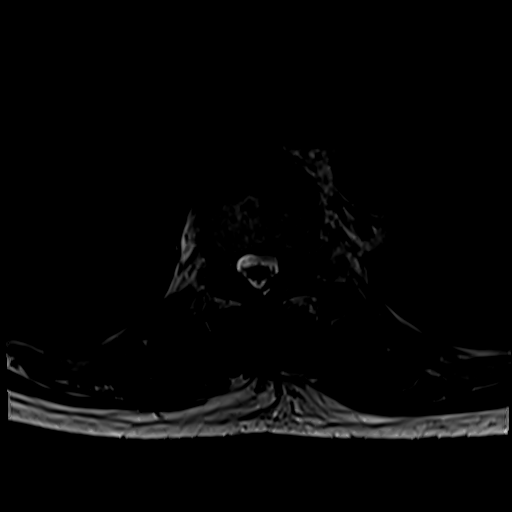

[22 of 48 positions shown; findings below may reference images not displayed]

FINDINGS: Segmentation: Normal, vestigial L5-S1 disc space. This is the same
numbering system used on the 07/13/2016 MRI.

Alignment: Chronic straightening and mild reversal of lumbar
lordosis with mostly levoconvex lumbar scoliosis. Chronic
retrolisthesis of L1 on L2 measures up to 6 millimeters but appears
stable since 0434.

Vertebrae: Chronic severe endplate degeneration with associated
marrow signal changes L2-L3 through L5-S1. Background bone marrow
signal remains normal. No acute or suspicious marrow lesion
identified. Intact visible sacrum and SI joints.

Conus medullaris and cauda equina: Conus extends to the L1 level. No
lower spinal cord or conus signal abnormality.

Paraspinal and other soft tissues: Negative.

Disc levels:

T11-T12: Borderline to mild spinal stenosis related to disc bulging
and moderate posterior element hypertrophy. Superimposed severe left
T11 foraminal stenosis which may be in part related to a left
foraminal disc protrusion. Moderate to severe right T11 foraminal
stenosis. These are stable since 0434.

T12-L1: Left eccentric disc bulge and posterior element hypertrophy.
No significant spinal stenosis. Mild to moderate T12 foraminal
stenosis. This level is stable.

L1-L2: Chronic retrolisthesis and circumferential disc bulge
eccentric to the left. Moderate posterior element hypertrophy with
new trace degenerative facet joint fluid. Mild spinal stenosis and
mild-to-moderate right greater than left L1 foraminal stenosis. This
level is stable.

L2-L3: Severe chronic disc space loss with bulky disc osteophyte
complex and moderate posterior element hypertrophy. Moderate to
severe spinal and right lateral recess stenosis. Severe right L2
foraminal stenosis. This level is stable.

L3-L4: Severe chronic disc space loss with bulky circumferential
disc osteophyte complex and posterior element hypertrophy. Epidural
fat contributes to moderate to severe spinal stenosis with moderate
left and severe right L3 foraminal stenosis. This level is stable.

L4-L5: Severe chronic disc space loss with bulky circumferential
disc osteophyte complex and posterior element hypertrophy. Severe
spinal stenosis and left greater than right lateral recess stenosis.
Severe left and mild right L4 foraminal stenosis. This level is
stable.

L5-S1: Severe chronic disc space loss with bulky circumferential
disc osteophyte complex and posterior element hypertrophy. Moderate
to severe spinal and bilateral lateral recess stenosis. Moderate to
severe L5 foraminal stenosis perhaps greater on the right. This
level is stable.

S1-S2: Vestigial disc, otherwise negative.
IMPRESSION: 1. No acute findings in the lumbar spine. Degenerative marrow signal
changes with no suspicious osseous lesion identified.
2. Stable since 0434 chronic severe spinal degeneration from L2-L3
through L5-S1 with subsequent moderate to severe spinal, lateral
recess, and foraminal stenosis throughout.
3. Chronic retrolisthesis of L1 on L2 with stable mild spinal and up
to moderate foraminal stenosis.
4. Stable mild lower thoracic spinal stenosis and moderate to severe
foraminal stenosis at T11-T12.

## 2021-07-02 ENCOUNTER — Ambulatory Visit (HOSPITAL_BASED_OUTPATIENT_CLINIC_OR_DEPARTMENT_OTHER): Payer: Medicare Other | Attending: Internal Medicine | Admitting: Physical Therapy

## 2021-07-02 ENCOUNTER — Encounter (HOSPITAL_BASED_OUTPATIENT_CLINIC_OR_DEPARTMENT_OTHER): Payer: Self-pay | Admitting: Physical Therapy

## 2021-07-02 ENCOUNTER — Other Ambulatory Visit: Payer: Self-pay

## 2021-07-02 DIAGNOSIS — R2689 Other abnormalities of gait and mobility: Secondary | ICD-10-CM | POA: Diagnosis not present

## 2021-07-02 DIAGNOSIS — M48062 Spinal stenosis, lumbar region with neurogenic claudication: Secondary | ICD-10-CM | POA: Diagnosis not present

## 2021-07-02 DIAGNOSIS — G8929 Other chronic pain: Secondary | ICD-10-CM | POA: Insufficient documentation

## 2021-07-02 DIAGNOSIS — M545 Low back pain, unspecified: Secondary | ICD-10-CM | POA: Diagnosis not present

## 2021-07-02 DIAGNOSIS — R296 Repeated falls: Secondary | ICD-10-CM | POA: Diagnosis not present

## 2021-07-02 DIAGNOSIS — M25562 Pain in left knee: Secondary | ICD-10-CM | POA: Diagnosis not present

## 2021-07-02 NOTE — Therapy (Signed)
OUTPATIENT PHYSICAL THERAPY LOWER EXTREMITY EVALUATION   Patient Name: Chad Miller MRN: [id number] DOB:07-12-1940, 82 y.o., male Today's Date: 07/03/2021   PT End of Session - 07/02/21 0858     Visit Number 1    Number of Visits 12    Date for PT Re-Evaluation 08/13/21    Authorization Type MCR A and B    PT Start Time 0845    PT Stop Time 0928    PT Time Calculation (min) 43 min    Activity Tolerance Patient tolerated treatment well    Behavior During Therapy WFL for tasks assessed/performed             Past Medical History:  Diagnosis Date   High blood pressure    High cholesterol    Loss of vision    Right   Past Surgical History:  Procedure Laterality Date   CATARACT EXTRACTION     REPLACEMENT TOTAL KNEE Left 2004   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 The pain has gradually gotten worse over time. He has difficulty doing his normal daily activity. He is also having knee pain. He had a replacement in 2012.  His knee has started acting up over th past few months. Dr Marlou Sa has looked at his knee. He has been using a Rolator walker for about a year. He has been depending upon it more over the past month.   PERTINENT HISTORY: Vertigo; seasonal allergies; Left TKA 2004,  Spinal stenosis of the lumbar spine.   PAIN:  Are you having pain? Yes NPRS scale: 5/10 current at worst 5/10  Pain location: mid knee  Pain orientation: Left  PAIN TYPE: aching and stiffness  Pain description: intermittent  Aggravating factors: standing and walking    Relieving factors: rest; hot shower; soak his feet  PAIN:  Are you having pain? Yes VAS scale: 0/10 current worst  Pain location: across the lower back  Pain orientation: Bilateral  PAIN TYPE: aching Pain description: constant  Aggravating factors: standing and walking   Relieving factors: heat;    PRECAUTIONS: None  WEIGHT BEARING RESTRICTIONS No  FALLS:  Has patient fallen in last 6 months? Yes, Number of falls: 1-2 a week. His home is [place] a hill. He has trouble going up the side of the hill   LIVING ENVIRONMENT: Lives on a hill   OCCUPATION: retired   Recreation: rides a bike; gave up golf   PLOF: Independent with community mobility with device  PATIENT GOALS   To have less pain in his  knee and back, or at least maintain his strength and current pain level.    OBJECTIVE:   DIAGNOSTIC FINDINGS:   PATIENT SURVEYS:  FOTO    COGNITION:  Overall cognitive status: Within functional limits for tasks assessed     SENSATION:  Light touch: Appears intact  Stereognosis: Appears intact  Hot/Cold: Appears intact  Proprioception: Appears intact   POSTURE:  Flexed trunk in standing; left knee valgus   PALPATION:   LE AROM/PROM: Lumbar AROM   40 degrees of flexion without pain Rotation 75% limited bilateral    PROM Right 07/03/2021 Left 07/03/2021  Hip flexion Full  Painful at 90 degrees   Hip extension    Hip abduction    Hip  adduction    Hip internal rotation    Hip external rotation    Knee flexion WNL  Tight at end range ( about 120 degrees)   Knee extension    Ankle dorsiflexion    Ankle plantarflexion    Ankle inversion    Ankle eversion     (Blank rows = not tested)  LE MMT:  MMT Right 07/03/2021 Left 07/03/2021  Hip flexion 29.9 30.5  Hip extension    Hip abduction 32.7 30.2  Hip adduction    Hip internal rotation    Hip external rotation    Knee flexion    Knee extension 50 56.1  Ankle dorsiflexion    Ankle plantarflexion    Ankle inversion    Ankle eversion     (Blank rows = not tested)   GAIT: With walker flexed trunk, decreased left single leg stance; decreased bilateral hip flexion; lateral movement with gait; Balance:  Narrow base CGA  Narrow base eyes closed min a  Tandem stance min A     TODAY'S TREATMENT:  Exercises Supine Quad Set - 1 x daily - 7 x weekly - 3 sets - 10 reps Supine Bridge - 1 x daily - 7 x weekly - 3 sets - 10 reps Small Range Straight Leg Raise - 1 x daily - 7 x weekly - 3 sets - 10 reps Standing Glute Med Mobilization with Small Ball on Wall - 1 x daily - 7 x weekly - 3 sets - 10 reps    PATIENT EDUCATION:  Education details: HEP; symptom management; POC; progression of activity  Person educated: Patient Education method: Explanation, Demonstration, Tactile cues, Verbal cues, and Handouts Education comprehension: verbalized understanding, returned demonstration, verbal cues required, tactile cues required, and needs further education   HOME EXERCISE PROGRAM: Access Code: [email or web address] URL: [email or web address] Date: 07/02/2021 Prepared by: Carolyne Littles  Exercises Supine Quad Set - 1 x daily - 7 x weekly - 3 sets - 10 reps Supine Bridge - 1 x daily - 7 x weekly - 3 sets - 10 reps Small Range Straight Leg Raise - 1 x daily - 7 x weekly - 3 sets - 10 reps Standing Glute Med Mobilization with Small Ball on Wall - 1 x daily - 7 x  weekly - 3 sets - 10 reps   ASSESSMENT:  CLINICAL IMPRESSION: Patient is an 81  y.o. male who was seen today for physical therapy evaluation and treatment for low back pain and left knee pain. He also has frequent falls. He presents with mild limitation sin knee movement and significant limitation in lumbar rotation. His strength is symmetrical and near is expected age related norms. He has trigger points in his lower lumbar spine. He would benefit from skilled therapy  to improve mobility and hold his progressive decline in motion and mobility.     Objective impairments include Abnormal gait, decreased activity tolerance, decreased balance, decreased endurance, difficulty walking, decreased ROM, decreased strength, and pain. These impairments are limiting patient from cleaning, driving, occupation, shopping, and yard work. Personal factors including Age and 1-2 comorbidities: vertigo, cataracts, left knee replacement  are also affecting patient's functional outcome. Patient will benefit from skilled PT to address above impairments and improve overall function.  REHAB POTENTIAL: Good  CLINICAL DECISION MAKING: Evolving/moderate complexity  EVALUATION COMPLEXITY: Moderate   GOALS: Goals reviewed with patient? Yes  SHORT TERM GOALS:  STG Name Target Date Goal status  1 Patient will increase bilateral lumbar rotation by 25%  Baseline:  07/24/2021 INITIAL  2 Patient will increase gross LE strength by 5 lbs  Baseline:  07/24/2021 INITIAL  3 Patient will increase bilateral hip flexion motion to full and pain free Baseline: 07/31/2021 INITIAL  LONG TERM GOALS:   LTG Name Target Date Goal status  1 Patient will stand for 30 min without pain in order to perform ADLs  Baseline: 08/14/2021 INITIAL  2 Patient will report a 75% reduction in falls in order to improve safety  Baseline: 08/14/2021 INITIAL  3 Patient will be independent with a compete stretching and exercise program  Baseline: 08/14/2021  INITIAL  PLAN: PT FREQUENCY: 1-2x/week  PT DURATION: 6 weeks  PLANNED INTERVENTIONS: Therapeutic exercises, Therapeutic activity, Neuro Muscular re-education, Balance training, Gait training, Patient/Family education, Joint mobilization, Stair training, Aquatic Therapy, Dry Needling, Cryotherapy, Moist heat, Ultrasound, and Manual therapy  PLAN FOR NEXT SESSION: Therapy focused more on exercises last visit. He may also benefit from a stretching program for his back to reduce the pain as he exercises. Work on balance exercises. He has frequent falls. Continue to progress knee strengthening. Progress to weight bearing as soon as able.    Carney Living, PT 07/03/2021, 11:58 AM

## 2021-07-04 ENCOUNTER — Ambulatory Visit: Payer: Medicare Other

## 2021-07-04 ENCOUNTER — Other Ambulatory Visit: Payer: Self-pay

## 2021-07-04 DIAGNOSIS — I493 Ventricular premature depolarization: Secondary | ICD-10-CM

## 2021-07-04 DIAGNOSIS — I517 Cardiomegaly: Secondary | ICD-10-CM | POA: Diagnosis not present

## 2021-07-09 ENCOUNTER — Encounter: Payer: Self-pay | Admitting: Internal Medicine

## 2021-07-10 ENCOUNTER — Encounter (HOSPITAL_BASED_OUTPATIENT_CLINIC_OR_DEPARTMENT_OTHER): Payer: Self-pay | Admitting: Physical Therapy

## 2021-07-10 ENCOUNTER — Other Ambulatory Visit: Payer: Self-pay

## 2021-07-10 ENCOUNTER — Ambulatory Visit (HOSPITAL_BASED_OUTPATIENT_CLINIC_OR_DEPARTMENT_OTHER): Payer: Medicare Other | Admitting: Physical Therapy

## 2021-07-10 DIAGNOSIS — G8929 Other chronic pain: Secondary | ICD-10-CM

## 2021-07-10 DIAGNOSIS — R2689 Other abnormalities of gait and mobility: Secondary | ICD-10-CM

## 2021-07-10 DIAGNOSIS — R296 Repeated falls: Secondary | ICD-10-CM

## 2021-07-10 DIAGNOSIS — M25562 Pain in left knee: Secondary | ICD-10-CM | POA: Diagnosis not present

## 2021-07-10 DIAGNOSIS — M545 Low back pain, unspecified: Secondary | ICD-10-CM | POA: Diagnosis not present

## 2021-07-10 DIAGNOSIS — M48062 Spinal stenosis, lumbar region with neurogenic claudication: Secondary | ICD-10-CM | POA: Diagnosis not present

## 2021-07-10 NOTE — Therapy (Signed)
OUTPATIENT PHYSICAL THERAPY TREATMENT NOTE   Patient Name: Chad Miller MRN: [id number] DOB:1941-01-30, 81 y.o., male Today's Date: 07/10/2021   PT End of Session - 07/10/21 1343     Visit Number 2    Number of Visits 12    Date for PT Re-Evaluation 08/13/21    Authorization Type MCR A and B    PT Start Time 1300    PT Stop Time 1340    PT Time Calculation (min) 40 min    Activity Tolerance Patient tolerated treatment well    Behavior During Therapy WFL for tasks assessed/performed              Past Medical History:  Diagnosis Date   High blood pressure    High cholesterol    Loss of vision    Right   Past Surgical History:  Procedure Laterality Date   CATARACT EXTRACTION     REPLACEMENT TOTAL KNEE Left 2004   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 He has been compliant with the HEP. He has been increased the time with riding the recumbent bike. Pt states the L knee is really messing with  his walking.   PERTINENT HISTORY: Vertigo; seasonal allergies; Left TKA 2004,  Spinal stenosis of the lumbar spine.   PAIN:  Are you having pain? Yes NPRS scale: 6/10 current  Pain location: mid knee  Pain orientation: Left  PAIN TYPE: aching and stiffness  Pain description: intermittent  Aggravating factors: standing and walking    Relieving factors: rest; hot shower; soak his feet  PAIN:  Are you having pain? Yes VAS scale: 5/10 current worst  Pain location: across the lower back  Pain orientation: Bilateral  PAIN TYPE: aching Pain description: constant  Aggravating factors: standing and walking   Relieving factors: heat;     WEIGHT BEARING RESTRICTIONS No  FALLS:  Has patient fallen in last 6 months? Yes, Number of falls: 1-2 a week. His home is [place] a hill. He has trouble going up the side of the hill    Recreation: rides a bike; gave up golf   PLOF: Independent with community mobility with device  PATIENT GOALS   To have less pain in his knee and back, or at least maintain his strength and current pain level.    OBJECTIVE:   TODAY'S TREATMENT: Joint mob: L knee TKE tibial ER grade IV, L knee flexion grade IV (firm end feel, no change in pain or walking)   Exercises Seated QL  stretch 30s 3x Seated lumbar flexion stretch 5s 10x Seated fig 4 stretch 30s 3x LTR supine 3s 10x Prone quad stretch 30s 3x      PATIENT EDUCATION:  Education details:  anatomy, exercise progression, DOMS expectations, muscle firing,  envelope of function, HEP, POC Person educated: Patient Education method: Explanation, Demonstration, Tactile cues, Verbal cues, and Handouts Education comprehension: verbalized understanding, returned demonstration, verbal cues required, tactile cues required, and needs further education   HOME EXERCISE PROGRAM: Access Code: [email or web address] URL: [email or web address] Date: 07/10/2021 Prepared by: Daleen Bo  Exercises Supine Bridge -  1 x daily - 7 x weekly - 3 sets - 10 reps Small Range Straight Leg Raise - 1 x daily - 7 x weekly - 3 sets - 10 reps Seated Quadratus Lumborum Stretch in Chair - 2 x daily - 7 x weekly - 1 sets - 3 reps - 30 hold Seated Figure 4 Piriformis Stretch - 2 x daily - 7 x weekly - 1 sets - 3 reps - 30 hold Prone Quadriceps Stretch with Strap - 2 x daily - 7 x weekly - 1 sets - 3 reps - 30 hold Supine Lower Trunk Rotation - 2 x daily - 7 x weekly - 2 sets - 10 reps - 2 hold    ASSESSMENT:  CLINICAL IMPRESSION: Pt presents with increased back and knee pain at today's session. Pt did have signficant reduction of pain with lumbopelvic stretching by end of today's session. Pt HEP updated at this time to include L knee and lumbar stretching. Pt presents as stiff dominant at today's and will likely continue to improve pain with lumbar mobilization. Plan to continue with L knee strength, balance, and lumbopelvic ROM as tolerated at future sessions. Knee joint mobs made no difference today with gait or pain.   Objective impairments include Abnormal gait, decreased activity tolerance, decreased balance, decreased endurance, difficulty walking, decreased ROM, decreased strength, and pain. These impairments are limiting patient from cleaning, driving, occupation, shopping, and yard work. Personal factors including Age and 1-2 comorbidities: vertigo, cataracts, left knee replacement  are also affecting patient's functional outcome. Patient will benefit from skilled PT to address above impairments and improve overall function.  REHAB POTENTIAL: Good  CLINICAL DECISION MAKING: Evolving/moderate complexity  EVALUATION COMPLEXITY: Moderate   GOALS: Goals reviewed with patient? Yes  SHORT TERM GOALS:  STG Name Target Date Goal status  1 Patient will increase bilateral lumbar rotation by 25%  Baseline:  07/31/2021 INITIAL  2 Patient will increase gross LE strength by 5 lbs  Baseline:  07/31/2021 INITIAL  3  Patient will increase bilateral hip flexion motion to full and pain free Baseline: 08/07/2021 INITIAL  LONG TERM GOALS:   LTG Name Target Date Goal status  1 Patient will stand for 30 min without pain in order to perform ADLs  Baseline: 08/21/2021 INITIAL  2 Patient will report a 75% reduction in falls in order to improve safety  Baseline: 08/21/2021 INITIAL  3 Patient will be independent with a compete stretching and exercise program  Baseline: 08/21/2021 INITIAL  PLAN: PT FREQUENCY: 1-2x/week  PT DURATION: 6 weeks  PLANNED INTERVENTIONS: Therapeutic exercises, Therapeutic activity, Neuro Muscular re-education, Balance training, Gait training, Patient/Family education, Joint mobilization, Stair training, Aquatic Therapy, Dry Needling, Cryotherapy, Moist heat, Ultrasound, and Manual therapy  PLAN FOR NEXT SESSION: Therapy focused more on exercises last visit. He may also benefit from a stretching program for his back to reduce the pain as he exercises. Work on Insurance underwriter  exercises. He has frequent falls. Continue to progress knee strengthening. Progress to weight bearing as soon as able.    Daleen Bo, PT 07/10/2021, 1:44 PM

## 2021-07-16 ENCOUNTER — Ambulatory Visit: Payer: Medicare Other | Admitting: Student

## 2021-07-16 ENCOUNTER — Other Ambulatory Visit: Payer: Self-pay

## 2021-07-16 ENCOUNTER — Encounter: Payer: Self-pay | Admitting: Student

## 2021-07-16 VITALS — BP 147/73 | HR 63 | Temp 97.9°F | Resp 16 | Ht 70.0 in | Wt 156.0 lb

## 2021-07-16 DIAGNOSIS — I493 Ventricular premature depolarization: Secondary | ICD-10-CM

## 2021-07-16 DIAGNOSIS — I10 Essential (primary) hypertension: Secondary | ICD-10-CM | POA: Diagnosis not present

## 2021-07-16 NOTE — Progress Notes (Signed)
Primary Physician/Referring:  [name], MD  Patient ID: Chad Miller, male    DOB: February 18, 1941, 81 y.o.   MRN: [id number]  Chief Complaint  Patient presents with   PVC   Hypertension   HLD   Follow-up    6 month   HPI:    Chad Miller  is a 81 y.o. AA male with history of hypertension, hyperlipidemia, and chronic pain syndrome managed by Dr. Ernestina Patches.  Patient was seen in our office by Dr. Einar Gip in 2014 for dizziness.  He was then referred back to our office by PCP for further evaluation and management of hypertension and hyperlipidemia.   Patient presents for 14-month follow-up.  Repeat echocardiogram showed normal LVEF of 55% with grade 1 diastolic dysfunction.  Patient is feeling well overall from a cardiovascular standpoint.  His blood pressure is mildly elevated in the office today, however it is typically well controlled.  His primary concern is bilateral numbness and tingling in his feet bilaterally as well as continued joint pain.  Denies chest pain, syncope, near syncope.  Past Medical History:  Diagnosis Date   High blood pressure    High cholesterol    Loss of vision    Right   Past Surgical History:  Procedure Laterality Date   CATARACT EXTRACTION     REPLACEMENT TOTAL KNEE Left 2004   Family History  Problem Relation Age of Onset   Heart disease Mother    Lung cancer Father    Asthma Sister    Heart disease Brother    Heart disease Brother    Allergic rhinitis Neg Hx    Eczema Neg Hx    Urticaria Neg Hx     Social History   Tobacco Use   Smoking status: Never   Smokeless tobacco: Never  Substance Use Topics   Alcohol use: No    Comment: Quit 2015   Marital Status: Married  ROS  Review of Systems  Constitutional: Negative for malaise/fatigue.  Cardiovascular:  Positive for dyspnea on exertion (stable). Negative for chest pain, claudication, near-syncope, orthopnea, palpitations, paroxysmal nocturnal dyspnea and syncope.  Musculoskeletal:   Positive for arthritis and back pain.  Neurological:  Negative for light-headedness.  Objective  Blood pressure (!) 147/73, pulse 63, temperature 97.9 F (36.6 C), temperature source Temporal, resp. rate 16, height 5\' 10"  (1.778 m), weight 156 lb (70.8 kg), SpO2 98 %.  Vitals with BMI 07/16/2021 07/16/2021 05/02/2021  Height - 5\' 10"  5\' 10"   Weight - 156 lbs 160 lbs  BMI - [id number]  Systolic [id number] (No Data)  Diastolic 73 76 (No Data)  Pulse 63 76 (No Data)    Physical Exam Vitals reviewed.  Cardiovascular:     Rate and Rhythm: Normal rate and regular rhythm. No extrasystoles are present.    Pulses: Intact distal pulses.          Carotid pulses are 2+ on the right side and 2+ on the left side.      Radial pulses are 2+ on the right side and 2+ on the left side.       Femoral pulses are 2+ on the right side and 2+ on the left side.      Popliteal pulses are 2+ on the right side and 1+ on the left side.       Dorsalis pedis pulses are 0 on the right side and 0 on the left side.       Posterior tibial pulses are 1+  on the right side and 1+ on the left side.     Heart sounds: S1 normal and S2 normal. No murmur heard.   No gallop.     Comments:   Pulmonary:     Effort: Pulmonary effort is normal.     Breath sounds: Normal breath sounds.  Musculoskeletal:     Right lower leg: Edema (trace ankle) present.     Left lower leg: Edema (trace ankle) present.   Laboratory examination:   Recent Labs    10/04/20 0954 04/24/21 1510  NA 139 139  K 3.9 4.8  CL 99 103  CO2 20 24  GLUCOSE 96 93  BUN 24 16  CREATININE 1.22 0.95  CALCIUM 10.1 9.5   CrCl cannot be calculated (Patient's most recent lab result is older than the maximum 21 days allowed.).  CMP Latest Ref Rng & Units 04/24/2021 10/10/2020 10/04/2020  Glucose 70 - 99 mg/dL 93 - 96  BUN 8 - 27 mg/dL 16 - 24  Creatinine 0.76 - 1.27 mg/dL 0.95 - 1.22  Sodium 134 - 144 mmol/L 139 - 139  Potassium 3.5 - 5.2 mmol/L 4.8 - 3.9   Chloride 96 - 106 mmol/L 103 - 99  CO2 20 - 29 mmol/L 24 - 20  Calcium 8.6 - 10.2 mg/dL 9.5 - 10.1  Total Protein 6.0 - 8.5 g/dL 7.6 8.3 -  Total Bilirubin 0.0 - 1.2 mg/dL 1.2 0.8 -  Alkaline Phos 44 - 121 IU/L 78 82 -  AST 0 - 40 IU/L 24 26 -  ALT 0 - 44 IU/L 23 27 -   CBC Latest Ref Rng & Units 04/24/2021 04/12/2020 04/01/2019  WBC 3.4 - 10.8 x10E3/uL 3.9 3.2(L) 4.1  Hemoglobin 13.0 - 17.7 g/dL 14.8 16.3 15.0  Hematocrit 37.5 - 51.0 % 42.5 47.6 42.4  Platelets 150 - 450 x10E3/uL 222 216 211    Lipid Panel Recent Labs    04/24/21 1510  CHOL 147  TRIG 43  LDLCALC 78  HDL 59  CHOLHDL 2.5   Lipid Panel     Component Value Date/Time   CHOL 147 04/24/2021 1510   TRIG 43 04/24/2021 1510   HDL 59 04/24/2021 1510   CHOLHDL 2.5 04/24/2021 1510   LDLCALC 78 04/24/2021 1510   LABVLDL 10 04/24/2021 1510     HEMOGLOBIN A1C Lab Results  Component Value Date   HGBA1C 5.4 04/01/2019   TSH No results for input(s): TSH in the last 8760 hours.   External labs:  None   Allergies  No Known Allergies   Medications Prior to Visit:   Outpatient Medications Prior to Visit  Medication Sig Dispense Refill   amLODipine (NORVASC) 5 MG tablet Take 1 tablet (5 mg total) by mouth every evening. 90 tablet 3   atorvastatin (LIPITOR) 10 MG tablet Take 1 tablet (10 mg total) by mouth daily. 90 tablet 2   azelastine (ASTELIN) 0.1 % nasal spray Place 2 sprays into both nostrils 2 (two) times daily. Use in each nostril as directed 30 mL 5   co-enzyme Q-10 30 MG capsule Take 30 mg by mouth daily.     loratadine (CLARITIN) 10 MG tablet Take 10 mg by mouth daily.     Melatonin 5 MG CAPS Take by mouth.     metoprolol succinate (TOPROL-XL) 25 MG 24 hr tablet Take 1 tablet (25 mg total) by mouth daily. Take with or immediately following a meal. 90 tablet 1   mometasone (NASONEX) 50 MCG/ACT nasal  spray Place 2 sprays into the nose daily.     Multiple Vitamin (MULTIVITAMIN) tablet Take 1 tablet by  mouth daily.     Nutritional Supplements (EQUATE) LIQD Take by mouth daily.     olmesartan (BENICAR) 20 MG tablet Take 1 tablet (20 mg total) by mouth in the morning. 90 tablet 2   Omega 3 1000 MG CAPS Take by mouth.     No facility-administered medications prior to visit.   Final Medications at End of Visit    Current Meds  Medication Sig   amLODipine (NORVASC) 5 MG tablet Take 1 tablet (5 mg total) by mouth every evening.   atorvastatin (LIPITOR) 10 MG tablet Take 1 tablet (10 mg total) by mouth daily.   azelastine (ASTELIN) 0.1 % nasal spray Place 2 sprays into both nostrils 2 (two) times daily. Use in each nostril as directed   co-enzyme Q-10 30 MG capsule Take 30 mg by mouth daily.   loratadine (CLARITIN) 10 MG tablet Take 10 mg by mouth daily.   Melatonin 5 MG CAPS Take by mouth.   metoprolol succinate (TOPROL-XL) 25 MG 24 hr tablet Take 1 tablet (25 mg total) by mouth daily. Take with or immediately following a meal.   mometasone (NASONEX) 50 MCG/ACT nasal spray Place 2 sprays into the nose daily.   Multiple Vitamin (MULTIVITAMIN) tablet Take 1 tablet by mouth daily.   Nutritional Supplements (EQUATE) LIQD Take by mouth daily.   olmesartan (BENICAR) 20 MG tablet Take 1 tablet (20 mg total) by mouth in the morning.   Omega 3 1000 MG CAPS Take by mouth.   Radiology:   No results found.  Chest x-ray 08/16/2020: Cardiac shadow is within normal limits. Tortuous thoracic aorta is again seen. Lungs are well aerated bilaterally. Healing rib fractures are noted on the right. No focal infiltrate or sizable effusion is seen. No acute bony abnormality is seen. IMPRESSION: No acute abnormality noted.  Cardiac Studies:   Echocardiogram 11/28/2011:  Normal LVEF.  Atrial septal aneurysm, small PFO.  Echocardiogram 08/16/2020:  1. 2D echocardiogram reveals preserved LVEF however strain is mildly  abnormal. Diastolic function could not be evaluated due to frequent PVCs  in bigeminal  pattern.. Left ventricular ejection fraction, by estimation,  is 55 to 60%. The left ventricle has  normal function. The left ventricle has no regional wall motion  abnormalities. Left ventricular diastolic parameters are indeterminate.  The average left ventricular global longitudinal strain is 13.1 %. The  global longitudinal strain is abnormal.   2. Right ventricular systolic function is normal. The right ventricular  size is normal. Tricuspid regurgitation signal is inadequate for assessing  PA pressure.   3. The mitral valve is normal in structure. Trivial mitral valve  regurgitation. No evidence of mitral stenosis.   4. The aortic valve is tricuspid. Aortic valve regurgitation is mild.  Mild to moderate aortic valve sclerosis/calcification is present, without  any evidence of aortic stenosis.   ABI 08/23/2020:  This exam reveals mildly decreased perfusion of the right lower extremity, noted at the anterior tibial and post tibial artery level (ABI 0.88).  There is severely abnormal monophasic waveform at the right ankle. 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    This exam reveals mildly decreased perfusion of the left lower extremity, noted at the anterior tibial and post tibial artery level (ABI 0.88) with mildly abnormal biphasic waveform at the left ankle.   PCV MYOCARDIAL PERFUSION WITH LEXISCAN 12/13/2020 1 Day Rest/Stress Protocol. Stress EKG  is non-diagnostic, as this is pharmacological stress test using Lexiscan. No convincing evidence of reversible myocardial ischemia or prior infarct. Left ventricular ejection fraction is 51% , visually appears normal. Left ventricular wall thickness is preserved without regional wall motion abnormalities. Low risk study.  PCV ECHOCARDIOGRAM COMPLETE 07/04/2021 Left ventricle cavity is normal in size. Moderate concentric hypertrophy of the left ventricle. Normal global wall motion. Normal LV systolic function with EF 55%. Doppler evidence of grade  I (impaired) diastolic dysfunction, normal LAP. 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 Structurally normal trileaflet aortic valve. No evidence of aortic stenosis. Mild (Grade I) aortic regurgitation. normal right atrial pressure. Previous study in 2013 reported trace AI, MR,TR, Possible PFO reported then not well appreciated om this study.  EKG:  07/16/2021: Sinus rhythm with PAC at a rate of 60 bpm.  Normal axis.  Left atrial enlargement.  Right bundle branch block.  Compared to EKG 04/03/2021, no significant change.  EKG 05/21/2012: Normal sinus rhythm at a rate of 56 bpm.  Incomplete right bundle branch block.  Otherwise normal.  No evidence of ischemia or injury pattern.  Assessment     ICD-10-CM   1. PVC (premature ventricular contraction)  I49.3 EKG 12-Lead    2. Primary hypertension  I10        There are no discontinued medications.   No orders of the defined types were placed in this encounter.   Orders Placed This Encounter  Procedures   EKG 12-Lead    Recommendations:   Chad Miller is a 81 y.o. AA male with history of hypertension, hyperlipidemia, and chronic pain syndrome managed by Dr. Ernestina Patches.  Patient was seen in our office by Dr. Einar Gip in 2014 for dizziness.  He was then referred back to our office by PCP (at patient's request) for further evaluation and management of hypertension and hyperlipidemia.   Patient presents for 64-month follow-up.  Repeat echocardiogram showed normal LVEF of 55% with grade 1 diastolic dysfunction.  Reviewed and discussed results of echocardiogram, details above.  The patient's blood pressure is mildly elevated in the office today will not make changes at his typically well controlled.  Patient will notify our office if blood pressure on home monitoring is >140/90 mmHg.  Continue amlodipine, atorvastatin, Toprol-XL, olmesartan.  Advised patient to follow-up with PCP regarding orthopedic complaints  as well as bilateral numbness and tingling in his feet, suspect neuropathy.  Follow-up in 6 months, sooner if needed.   Alethia Berthold, PA-C 07/16/2021, 10:52 AM Office: [phone]

## 2021-07-17 ENCOUNTER — Ambulatory Visit (INDEPENDENT_AMBULATORY_CARE_PROVIDER_SITE_OTHER): Payer: Medicare Other

## 2021-07-17 ENCOUNTER — Telehealth: Payer: Medicare Other

## 2021-07-17 DIAGNOSIS — E7800 Pure hypercholesterolemia, unspecified: Secondary | ICD-10-CM

## 2021-07-17 DIAGNOSIS — M48062 Spinal stenosis, lumbar region with neurogenic claudication: Secondary | ICD-10-CM

## 2021-07-17 DIAGNOSIS — I129 Hypertensive chronic kidney disease with stage 1 through stage 4 chronic kidney disease, or unspecified chronic kidney disease: Secondary | ICD-10-CM

## 2021-07-17 DIAGNOSIS — I739 Peripheral vascular disease, unspecified: Secondary | ICD-10-CM

## 2021-07-17 NOTE — Chronic Care Management (AMB) (Signed)
Chronic Care Management   CCM RN Visit Note  07/17/2021 Name: Chad Miller MRN: [id number] DOB: Jun 20, 1940  Subjective: Chad Miller is a 81 y.o. year old male who is a primary care patient of [name], MD. The care management team was consulted for assistance with disease management and care coordination needs.    Engaged with patient by telephone for follow up visit in response to provider referral for case management and/or care coordination services.   Consent to Services:  The patient was given information about Chronic Care Management services, agreed to services, and gave verbal consent prior to initiation of services.  Please see initial visit note for detailed documentation.   Patient agreed to services and verbal consent obtained.   Assessment: Review of patient past medical history, allergies, medications, health status, including review of consultants reports, laboratory and other test data, was performed as part of comprehensive evaluation and provision of chronic care management services.   SDOH (Social Determinants of Health) assessments and interventions performed:  Yes, no acute challenges   CCM Care Plan  No Known Allergies  Outpatient Encounter Medications as of 07/17/2021  Medication Sig   amLODipine (NORVASC) 5 MG tablet Take 1 tablet (5 mg total) by mouth every evening.   atorvastatin (LIPITOR) 10 MG tablet Take 1 tablet (10 mg total) by mouth daily.   azelastine (ASTELIN) 0.1 % nasal spray Place 2 sprays into both nostrils 2 (two) times daily. Use in each nostril as directed   co-enzyme Q-10 30 MG capsule Take 30 mg by mouth daily.   loratadine (CLARITIN) 10 MG tablet Take 10 mg by mouth daily.   Melatonin 5 MG CAPS Take by mouth.   metoprolol succinate (TOPROL-XL) 25 MG 24 hr tablet Take 1 tablet (25 mg total) by mouth daily. Take with or immediately following a meal.   mometasone (NASONEX) 50 MCG/ACT nasal spray Place 2 sprays into the nose daily.    Multiple Vitamin (MULTIVITAMIN) tablet Take 1 tablet by mouth daily.   Nutritional Supplements (EQUATE) LIQD Take by mouth daily.   olmesartan (BENICAR) 20 MG tablet Take 1 tablet (20 mg total) by mouth in the morning.   Omega 3 1000 MG CAPS Take by mouth.   No facility-administered encounter medications on file as of 07/17/2021.    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    Conditions to be addressed/monitored: Hypertensive nephropathy, Pure Hypercholesterolemia, Peripheral artery disease, Spinal stenosis of lumbar region with neurogenic claudication    Care Plan : [place] of Care  Updates made by Lynne Logan, RN since 07/17/2021 12:00 AM     Problem: No plan established for management of chronic disease states (Hypertensive nephropathy, Pure Hypercholesterolemia, Peripheral artery disease, Spinal stenosis of lumbar region with neurogenic claudication)   Priority: High     Long-Range Goal: Establishment of plan of care for management of chronic disease states (Hypertensive nephropathy, Pure Hypercholesterolemia, Peripheral artery disease, Spinal stenosis of lumbar region with neurogenic claudication)   Start  Date: 06/15/2021  Expected End Date: 06/14/2022  Recent Progress: On track  Priority: High  Note:   Current Barriers:  Knowledge Deficits related to plan of care for management of Hypertensive nephropathy, Pure Hypercholesterolemia, Peripheral artery disease,  Spinal stenosis of lumbar region with neurogenic claudication    Chronic Disease Management support and education needs related to Hypertensive nephropathy, Pure Hypercholesterolemia, Peripheral artery disease, Spinal stenosis of lumbar region with neurogenic claudication      RNCM Clinical Goal(s):  Patient will verbalize basic understanding of  Hypertensive nephropathy, Pure Hypercholesterolemia, Peripheral artery disease, Spinal stenosis of lumbar region with neurogenic claudication    disease process and self health management plan as evidenced by patient will report having no disease exacerbations related to his chronic disease states as listed above take all medications exactly as prescribed and will call provider for medication related questions as evidenced by demonstrate improved understanding of prescribed medications and rationale for usage as evidenced by patient teach back demonstrate Improved health management independence as evidenced by patient will report 100% adherence to his prescribed treatment plan of care  continue to work with RN Care Manager to address care management and care coordination needs related to   Hypertensive nephropathy, Pure Hypercholesterolemia, Peripheral artery disease, Spinal stenosis of lumbar region with neurogenic claudication    as evidenced by adherence to CM Team Scheduled appointments demonstrate ongoing self health care management ability   as evidenced by    through collaboration with RN Care manager, provider, and care team.   Interventions: 1:1 collaboration with primary care provider regarding development and update of comprehensive plan of care as evidenced by provider attestation and co-signature Inter-disciplinary care team collaboration (see longitudinal plan of care) Evaluation of current treatment plan related to  self management and patient's adherence to plan as established by provider  Hypertension Interventions:  (Status:  Goal on track:   Yes.) Long Term Goal Last practice recorded BP readings:  BP Readings from Last 3 Encounters:  07/16/21 (!) 147/73  04/24/21 136/80  04/03/21 134/68  Most recent eGFR/CrCl:  Lab Results  Component Value Date   EGFR 80 04/24/2021    No components found for: CRCL Evaluation of current treatment plan related to hypertension self management and patient's adherence to plan as established by provider Provided education to patient re: stroke prevention, s/s of heart attack and stroke Counseled on the importance of exercise goals with target of 150 minutes per week Advised patient, providing education and rationale, to monitor blood pressure daily and record, calling PCP for findings outside established parameters Provided education on prescribed diet low Sodium Mailed printed educational material related to How to Perform Chair Exercises  Pain Interventions:  (Status:  Goal on track:  Yes.) Long Term Goal Evaluation of current treatment plan related to chronic pain self management and patient's adherence to plan as established by provider Pain assessment performed Medications reviewed Reviewed provider established plan for pain management Discussed importance of adherence to all scheduled medical appointments Counseled on the importance of reporting any/all new or changed pain symptoms or management strategies to pain management provider Discussed use of relaxation techniques and/or diversional activities to assist with pain reduction (distraction, imagery, relaxation, massage, acupressure, TENS, heat, and cold application Reviewed with patient prescribed pharmacological and nonpharmacological pain relief strategies Determined patient has started outpatient PT with good effectiveness  Educated patient on the benefits of working with PT to develop a safe and effective HEP, educated on recommendations for 150 minutes weekly routine physical  exercise as tolerated  Reviewed and dicussed the  benefits of implementing an exercise ball, encouraged patient to further discuss this with his PT at next visit  Educated on the importance to stay well hydrated while exercising  Discussed plans with patient for ongoing care management follow up and provided patient with direct contact information for care management team  Patient Goals/Self-Care Activities: Take all medications as prescribed Attend all scheduled provider appointments Call pharmacy for medication refills 3-7 days in advance of running out of medications Perform all self care activities independently  Call provider office for new concerns or questions  check blood pressure 3 times per week keep a blood pressure log take blood pressure log to all doctor appointments call doctor for signs and symptoms of high blood pressure take medications for blood pressure exactly as prescribed report new symptoms to your doctor Develop a HEP with your PT, shoot for 30 minutes per day as tolerated   Follow Up Plan:  Telephone follow up appointment with care management team member scheduled for:  09/14/21      Barb Merino, RN, BSN, CCM Care Management Coordinator [place] [facility]  Direct Phone: [phone]

## 2021-07-17 NOTE — Patient Instructions (Signed)
Visit Information  Thank you for taking time to visit with me today. Please don't hesitate to contact me if I can be of assistance to you before our next scheduled telephone appointment.  Following are the goals we discussed today:  (Copy and paste patient goals from clinical care plan here)  Our next appointment is by telephone on 09/14/21 at 12:45 PM  Please call the care guide team at [phone] if you need to cancel or reschedule your appointment.   If you are experiencing a Mental Health or Behavioral Health Crisis or need someone to talk to, please call 1-800-273-TALK (toll free, 24 hour hotline)   Patient verbalizes understanding of instructions and care plan provided today and agrees to view in MyChart. Active MyChart status confirmed with patient.    Delsa Sale, RN, BSN, CCM Care Management Coordinator [facility]  Direct Phone: [phone]

## 2021-07-19 ENCOUNTER — Encounter (HOSPITAL_BASED_OUTPATIENT_CLINIC_OR_DEPARTMENT_OTHER): Payer: Self-pay | Admitting: Physical Therapy

## 2021-07-19 ENCOUNTER — Other Ambulatory Visit: Payer: Self-pay

## 2021-07-19 ENCOUNTER — Ambulatory Visit (HOSPITAL_BASED_OUTPATIENT_CLINIC_OR_DEPARTMENT_OTHER): Payer: Medicare Other | Attending: Internal Medicine | Admitting: Physical Therapy

## 2021-07-19 DIAGNOSIS — G8929 Other chronic pain: Secondary | ICD-10-CM | POA: Diagnosis not present

## 2021-07-19 DIAGNOSIS — M25562 Pain in left knee: Secondary | ICD-10-CM | POA: Diagnosis not present

## 2021-07-19 DIAGNOSIS — R2689 Other abnormalities of gait and mobility: Secondary | ICD-10-CM | POA: Diagnosis not present

## 2021-07-19 DIAGNOSIS — M545 Low back pain, unspecified: Secondary | ICD-10-CM | POA: Insufficient documentation

## 2021-07-19 DIAGNOSIS — R296 Repeated falls: Secondary | ICD-10-CM | POA: Insufficient documentation

## 2021-07-19 NOTE — Therapy (Signed)
?OUTPATIENT PHYSICAL THERAPY TREATMENT NOTE ? ? ?Patient Name: Chad Miller ?MRN: [id number] ?DOB:01/09/1941, 81 y.o., male ?Today's Date: 07/19/2021 ? ? PT End of Session - 07/19/21 1153   ? ? Visit Number 3   ? Number of Visits 12   ? Date for PT Re-Evaluation 08/13/21   ? Authorization Type MCR A and B   ? PT Start Time 1145   ? PT Stop Time 1225   ? PT Time Calculation (min) 40 min   ? Activity Tolerance Patient tolerated treatment well   ? Behavior During Therapy [facility] for tasks assessed/performed   ? ?  ?  ? ?  ? ? ? ? ?Past Medical History:  ?Diagnosis Date  ? High blood pressure   ? High cholesterol   ? Loss of vision   ? Right  ? ?Past Surgical History:  ?Procedure Laterality Date  ? CATARACT EXTRACTION    ? REPLACEMENT TOTAL KNEE Left 2004  ? ?Patient Active Problem List  ? Diagnosis Date Noted  ? 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 Pure hypercholesterolemia 04/24/2021  ? OSA (obstructive sleep apnea) 08/15/2016  ? 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 Snoring 07/05/2016  ? Excessive daytime sleepiness 07/05/2016  ? 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 Gait disturbance 04/22/2016  ? Vertigo 04/22/2016  ? Urinary urgency 04/22/2016  ? Spinal stenosis of lumbar region 04/22/2016  ? 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 ? ?PCP: [name], MD ? ?REFERRING PROVIDER: [name], MD ? ?REFERRING DIAG: E42.353 (ICD-10-CM) - Spinal stenosis of lumbar region with neurogenic claudication ? ?THERAPY DIAG:  ?Chronic bilateral low back pain without sciatica ? ?Other abnormalities of gait and mobility ? ?Chronic pain of left knee ? ?Repeated falls ? ?ONSET DATE: Has had pain in her knee  ? ?SUBJECTIVE:  ? ?SUBJECTIVE STATEMENT: ?Pt states that the back pain is better with the HEP but he still feels limited with mobility. Pt states his main goal is to work on his mobility as it limits his ability to get around and do what he likes.   ? ?PERTINENT HISTORY: ?Vertigo; seasonal allergies; Left TKA 2004,  ?Spinal stenosis of the lumbar spine.  ? ?PAIN:  ?Are you having pain? Yes ?NPRS scale: 5/10 current  ?Pain location: back  ?Pain orientation: Left  ?PAIN TYPE: aching and stiffness  ?Pain description: intermittent  ?Aggravating factors: standing and walking    ?Relieving factors: rest; hot shower; soak his feet ? ?PAIN:  ?Are you having pain? Yes ?VAS scale: 5/10 current worst  ?Pain location: across the lower back  ?Pain orientation: Bilateral  ?PAIN TYPE: aching ?Pain description: constant  ?Aggravating factors: standing and walking   ?Relieving factors: heat;   ? ? ?WEIGHT BEARING RESTRICTIONS No ? ?FALLS:  ?Has patient fallen in last 6 months? Yes, Number of falls: 1-2 a week. His home is [place] hill. He has trouble going up the side of the hill  ? ? ?Recreation: rides a bike; gave up golf  ? ?PLOF: Independent with community mobility with device ? ?PATIENT GOALS  ? ?To have less pain in his knee and back, or at least maintain his strength and current pain level.  ? ? ?OBJECTIVE:  ? ?TODAY'S TREATMENT: ?Nustep L6 6 min ? ?Exercises ? ?CGA with gait belt throughout ?STS 3x10 from midthigh height (verbally added for home)  ?Standing high march 2x20 ?Sidestepping along table 4x no  UE ?NBOS on foam 30s 4x ?Attempted step up onto foam but could no clear R foot  ? ? ? ?PATIENT EDUCATION:  ?Education details:  exercise progression, balance systems, safety at home,  envelope of function, HEP, POC ?Person educated: Patient ?Education method: Explanation, Demonstration, Tactile cues, Verbal cues, and Handouts ?Education comprehension: verbalized understanding, returned demonstration, verbal cues required, tactile cues required, and needs further education ? ? ?HOME EXERCISE PROGRAM: ?Access Code: [email or web address] ?URL: [email or web address] ?Date: 07/10/2021 ?Prepared by: Daleen Bo ? ?Exercises ?Supine Bridge - 1 x daily - 7 x weekly - 3 sets - 10  reps ?Small Range Straight Leg Raise - 1 x daily - 7 x weekly - 3 sets - 10 reps ?Seated Quadratus Lumborum Stretch in Chair - 2 x daily - 7 x weekly - 1 sets - 3 reps - 30 hold ?Seated Figure 4 Piriformis Stretch - 2 x daily - 7 x weekly - 1 sets - 3 reps - 30 hold ?Prone Quadriceps Stretch with Strap - 2 x daily - 7 x weekly - 1 sets - 3 reps - 30 hold ?Supine Lower Trunk Rotation - 2 x daily - 7 x weekly - 2 sets - 10 reps - 2 hold ? ? ? ?ASSESSMENT: ? ?CLINICAL IMPRESSION: ?Pt requests to focus on mobility and balance with future sessions as he feels this is his most limiting factor. Pt able to tolerate more dynamic standing movements today without significant back pain or discomfort. Pt requires CGA throughout session with standing and dynamic balance due to poor foot clearance and apprehension with movement. Plan to continue with standing balance and LE strength as able.  ? ? ?Objective impairments include Abnormal gait, decreased activity tolerance, decreased balance, decreased endurance, difficulty walking, decreased ROM, decreased strength, and pain. These impairments are limiting patient from cleaning, driving, occupation, shopping, and yard work. Personal factors including Age and 1-2 comorbidities: vertigo, cataracts, left knee replacement  are also affecting patient's functional outcome. Patient will benefit from skilled PT to address above impairments and improve overall function. ? ?REHAB POTENTIAL: Good ? ?CLINICAL DECISION MAKING: Evolving/moderate complexity ? ?EVALUATION COMPLEXITY: Moderate ? ? ?GOALS: ?Goals reviewed with patient? Yes ? ?SHORT TERM GOALS: ? ?STG Name Target Date Goal status  ?1 Patient will increase bilateral lumbar rotation by 25%  ?Baseline:  08/09/2021 INITIAL  ?2 Patient will increase gross LE strength by 5 lbs  ?Baseline:  08/09/2021 INITIAL  ?3 Patient will increase bilateral hip flexion motion to full and pain free ?Baseline: 08/16/2021 INITIAL  ?LONG TERM GOALS:  ? ?LTG  Name Target Date Goal status  ?1 Patient will stand for 30 min without pain in order to perform ADLs  ?Baseline: 08/30/2021 INITIAL  ?2 Patient will report a 75% reduction in falls in order to improve safety  ?Baseline: 08/30/2021 INITIAL  ?3 Patient will be independent with a compete stretching and exercise program  ?Baseline: 08/30/2021 INITIAL  ?PLAN: ?PT FREQUENCY: 1-2x/week ? ?PT DURATION: 6 weeks ? ?PLANNED INTERVENTIONS: Therapeutic exercises, Therapeutic activity, Neuro Muscular re-education, Balance training, Gait training, Patient/Family education, Joint mobilization, Stair training, Aquatic Therapy, Dry Needling, Cryotherapy, Moist heat, Ultrasound, and Manual therapy ? ?PLAN FOR NEXT SESSION: Therapy focused more on exercises last visit. He may also benefit from a stretching program for his back to reduce the pain as he exercises. Work on balance exercises. He has frequent falls. Continue to progress knee strengthening. Progress to weight bearing as soon as able.  ? ? ?Daleen Bo, PT ?07/19/2021,  12:33 PM  ?

## 2021-07-25 ENCOUNTER — Other Ambulatory Visit: Payer: Self-pay

## 2021-07-25 ENCOUNTER — Encounter (HOSPITAL_BASED_OUTPATIENT_CLINIC_OR_DEPARTMENT_OTHER): Payer: Self-pay | Admitting: Physical Therapy

## 2021-07-25 ENCOUNTER — Ambulatory Visit (HOSPITAL_BASED_OUTPATIENT_CLINIC_OR_DEPARTMENT_OTHER): Payer: Medicare Other | Admitting: Physical Therapy

## 2021-07-25 DIAGNOSIS — R2689 Other abnormalities of gait and mobility: Secondary | ICD-10-CM | POA: Diagnosis not present

## 2021-07-25 DIAGNOSIS — R296 Repeated falls: Secondary | ICD-10-CM

## 2021-07-25 DIAGNOSIS — G8929 Other chronic pain: Secondary | ICD-10-CM | POA: Diagnosis not present

## 2021-07-25 DIAGNOSIS — M25562 Pain in left knee: Secondary | ICD-10-CM | POA: Diagnosis not present

## 2021-07-25 DIAGNOSIS — M545 Low back pain, unspecified: Secondary | ICD-10-CM

## 2021-07-25 NOTE — Therapy (Signed)
?OUTPATIENT PHYSICAL THERAPY TREATMENT NOTE ? ? ?Patient Name: Chad Miller ?MRN: [id number] ?DOB:04-26-1941, 81 y.o., male ?Today's Date: 07/25/2021 ? ? PT End of Session - 07/25/21 1314   ? ? Visit Number 4   ? Number of Visits 12   ? Date for PT Re-Evaluation 08/13/21   ? Authorization Type MCR A and B   ? PT Start Time 1145   ? PT Stop Time 1226   ? PT Time Calculation (min) 41 min   ? Activity Tolerance Patient tolerated treatment well   ? Behavior During Therapy [facility] for tasks assessed/performed   ? ?  ?  ? ?  ? ? ? ? ? ?Past Medical History:  ?Diagnosis Date  ? High blood pressure   ? High cholesterol   ? Loss of vision   ? Right  ? ?Past Surgical History:  ?Procedure Laterality Date  ? CATARACT EXTRACTION    ? REPLACEMENT TOTAL KNEE Left 2004  ? ?Patient Active Problem List  ? Diagnosis Date Noted  ? 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 Pure hypercholesterolemia 04/24/2021  ? OSA (obstructive sleep apnea) 08/15/2016  ? 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 Snoring 07/05/2016  ? Excessive daytime sleepiness 07/05/2016  ? 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 Gait disturbance 04/22/2016  ? Vertigo 04/22/2016  ? Urinary urgency 04/22/2016  ? Spinal stenosis of lumbar region 04/22/2016  ? 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 ? ?PCP: [name], MD ? ?REFERRING PROVIDER: [name], MD ? ?REFERRING DIAG: Z30.076 (ICD-10-CM) - Spinal stenosis of lumbar region with neurogenic claudication ? ?THERAPY DIAG:  ?Chronic bilateral low back pain without sciatica ? ?Other abnormalities of gait and mobility ? ?Chronic pain of left knee ? ?Repeated falls ? ?ONSET DATE: Has had pain in her knee  ? ?SUBJECTIVE:  ? ?SUBJECTIVE STATEMENT: ?Pt states that the back pain is better with the HEP but he still feels limited with mobility. Pt states his main goal is to work on his mobility as it limits his ability to get around and do what he likes.   ? ?PERTINENT HISTORY: ?Vertigo; seasonal allergies; Left TKA 2004,  ?Spinal stenosis of the lumbar spine.  ? ?PAIN:  ?Are you having pain? Yes ?NPRS scale: 5/10 current  ?Pain location: back  ?Pain orientation: Left  ?PAIN TYPE: aching and stiffness  ?Pain description: intermittent  ?Aggravating factors: standing and walking    ?Relieving factors: rest; hot shower; soak his feet ? ?PAIN:  ?Are you having pain? Yes ?VAS scale: 5/10 current worst  ?Pain location: across the lower back  ?Pain orientation: Bilateral  ?PAIN TYPE: aching ?Pain description: constant  ?Aggravating factors: standing and walking   ?Relieving factors: heat;   ? ? ?WEIGHT BEARING RESTRICTIONS No ? ?FALLS:  ?Has patient fallen in last 6 months? Yes, Number of falls: 1-2 a week. His home is [place] a hill. He has trouble going up the side of the hill  ? ? ?Recreation: rides a bike; gave up golf  ? ?PLOF: Independent with community mobility with device ? ?PATIENT GOALS  ? ?To have less pain in his knee and back, or at least maintain his strength and current pain level.  ? ? ?OBJECTIVE:  ? ?TODAY'S TREATMENT: ?3/8 ? ? ? ?Last Visit:  ? ?Nustep L6 6 min ? ?Exercises ? ?CGA with gait belt throughout ?STS 3x10 from midthigh height (verbally added for home)  ?  Standing high march 2x20 ?Sidestepping along table 4x no UE ?NBOS on foam 30s 4x ?Attempted step up onto foam but could no clear R foot  ? ? ? ?PATIENT EDUCATION:  ?Education details:  exercise progression, balance systems, safety at home,  envelope of function, HEP, POC ?Person educated: Patient ?Education method: Explanation, Demonstration, Tactile cues, Verbal cues, and Handouts ?Education comprehension: verbalized understanding, returned demonstration, verbal cues required, tactile cues required, and needs further education ? ? ?HOME EXERCISE PROGRAM: ?Access Code: [email or web address] ?URL: [email or web address] ?Date: 07/10/2021 ?Prepared by: Daleen Bo ? ?Exercises ?Supine Bridge - 1 x daily -  7 x weekly - 3 sets - 10 reps ?Small Range Straight Leg Raise - 1 x daily - 7 x weekly - 3 sets - 10 reps ?Seated Quadratus Lumborum Stretch in Chair - 2 x daily - 7 x weekly - 1 sets - 3 reps - 30 hold ?Seated Figure 4 Piriformis Stretch - 2 x daily - 7 x weekly - 1 sets - 3 reps - 30 hold ?Prone Quadriceps Stretch with Strap - 2 x daily - 7 x weekly - 1 sets - 3 reps - 30 hold ?Supine Lower Trunk Rotation - 2 x daily - 7 x weekly - 2 sets - 10 reps - 2 hold ? ? ? ?ASSESSMENT: ? ?CLINICAL IMPRESSION: ?The patient would like to work on a home program. He is finding it difficult to get here on a regular basis. We expanded his home program. We reviewed balance exercises and added 2 standing exercises to his home program. The patient was advised to make sure that he is safe when he does his standing exercises. He feels comfortable with his program. Therapy reviewed progression of exercises. We will D/C to HEP at this time. He has not met goals 2nd to not having many visits to make a noticeable change in things like balance. He still feels ready for discharge  ? ?Objective impairments include Abnormal gait, decreased activity tolerance, decreased balance, decreased endurance, difficulty walking, decreased ROM, decreased strength, and pain. These impairments are limiting patient from cleaning, driving, occupation, shopping, and yard work. Personal factors including Age and 1-2 comorbidities: vertigo, cataracts, left knee replacement  are also affecting patient's functional outcome. Patient will benefit from skilled PT to address above impairments and improve overall function. ? ?REHAB POTENTIAL: Good ? ?CLINICAL DECISION MAKING: Evolving/moderate complexity ? ?EVALUATION COMPLEXITY: Moderate ? ? ?GOALS: ?Goals reviewed with patient? Yes ? ?SHORT TERM GOALS: ? ?STG Name Target Date Goal status ?  ?1 Patient will increase bilateral lumbar rotation by 25%  ?Baseline:  08/15/2021 Still about the same   ?2 Patient will  increase gross LE strength by 5 lbs  ?Baseline:  08/15/2021 Not tested   ?3 Patient will increase bilateral hip flexion motion to full and pain free ?Baseline: 08/22/2021 Improved hip flexion ?Achieved   ?LONG TERM GOALS:  ? ?LTG Name Target Date Goal status  ?1 Patient will stand for 30 min without pain in order to perform ADLs  ?Baseline: 09/05/2021 Progressing but tight today   ?2 Patient will report a 75% reduction in falls in order to improve safety  ?Baseline: 09/05/2021 Has not had enouh time to have a noticeable difference   ?3 Patient will be independent with a compete stretching and exercise program  ?Baseline: 09/05/2021 HEP   ?PLAN: ?PT FREQUENCY: 1-2x/week ? ?PT DURATION: 6 weeks ? ?PLANNED INTERVENTIONS: Therapeutic exercises, Therapeutic activity, Neuro Muscular re-education, Balance training, Gait training, Patient/Family education, Joint mobilization,  Stair training, Aquatic Therapy, Dry Needling, Cryotherapy, Moist heat, Ultrasound, and Manual therapy ? ?PLAN FOR NEXT SESSION: Therapy focused more on exercises last visit. He may also benefit from a stretching program for his back to reduce the pain as he exercises. Work on balance exercises. He has frequent falls. Continue to progress knee strengthening. Progress to weight bearing as soon as able.  ? ?PHYSICAL THERAPY DISCHARGE SUMMARY ? ?Visits from Start of Care: 4 ? ?Current functional level related to goals / functional outcomes: ?Mild improvements but not enough visits for a significant difference. His back pain overall improved  ?  ?Remaining deficits: ?Balance and back tightness  ?  ?Education / Equipment: ?HEP  ? ?Patient agrees to discharge. Patient goals were not met. Patient is being discharged due to meeting the stated rehab goals.  ? ?Carney Living, PT ?07/25/2021, 1:30 PM  ?

## 2021-09-14 ENCOUNTER — Other Ambulatory Visit: Payer: Self-pay

## 2021-09-14 ENCOUNTER — Telehealth: Payer: Medicare Other

## 2021-09-14 ENCOUNTER — Ambulatory Visit (INDEPENDENT_AMBULATORY_CARE_PROVIDER_SITE_OTHER): Payer: Medicare Other

## 2021-09-14 DIAGNOSIS — I129 Hypertensive chronic kidney disease with stage 1 through stage 4 chronic kidney disease, or unspecified chronic kidney disease: Secondary | ICD-10-CM

## 2021-09-14 DIAGNOSIS — M48062 Spinal stenosis, lumbar region with neurogenic claudication: Secondary | ICD-10-CM

## 2021-09-14 DIAGNOSIS — I739 Peripheral vascular disease, unspecified: Secondary | ICD-10-CM

## 2021-09-14 DIAGNOSIS — E7800 Pure hypercholesterolemia, unspecified: Secondary | ICD-10-CM

## 2021-09-14 MED ORDER — METOPROLOL SUCCINATE ER 25 MG PO TB24
25.0000 mg | ORAL_TABLET | Freq: Every day | ORAL | 1 refills | Status: DC
Start: 2021-09-14 — End: 2021-12-26

## 2021-09-14 NOTE — Chronic Care Management (AMB) (Signed)
?Chronic Care Management  ? ?CCM RN Visit Note ? ?09/14/2021 ?Name: Chad Miller MRN: [id number] DOB: 01-19-1941 ? ?Subjective: ?Chad Miller is a 81 y.o. year old male who is a primary care patient of [name], MD. The care management team was consulted for assistance with disease management and care coordination needs.   ? ?Engaged with patient by telephone for follow up visit in response to provider referral for case management and/or care coordination services.  ? ?Consent to Services:  ?The patient was given information about Chronic Care Management services, agreed to services, and gave verbal consent prior to initiation of services.  Please see initial visit note for detailed documentation.  ? ?Patient agreed to services and verbal consent obtained.  ? ?Assessment: Review of patient past medical history, allergies, medications, health status, including review of consultants reports, laboratory and other test data, was performed as part of comprehensive evaluation and provision of chronic care management services.  ? ?SDOH (Social Determinants of Health) assessments and interventions performed: Yes, no acute needs   ? ?CCM Care Plan ? ?No Known Allergies ? ?Outpatient Encounter Medications as of 09/14/2021  ?Medication Sig  ? amLODipine (NORVASC) 5 MG tablet Take 1 tablet (5 mg total) by mouth every evening.  ? atorvastatin (LIPITOR) 10 MG tablet Take 1 tablet (10 mg total) by mouth daily.  ? azelastine (ASTELIN) 0.1 % nasal spray Place 2 sprays into both nostrils 2 (two) times daily. Use in each nostril as directed  ? co-enzyme Q-10 30 MG capsule Take 30 mg by mouth daily.  ? loratadine (CLARITIN) 10 MG tablet Take 10 mg by mouth daily.  ? Melatonin 5 MG CAPS Take by mouth.  ? metoprolol succinate (TOPROL-XL) 25 MG 24 hr tablet Take 1 tablet (25 mg total) by mouth daily. Take with or immediately following a meal.  ? mometasone (NASONEX) 50 MCG/ACT nasal spray Place 2 sprays into the nose daily.  ?  Multiple Vitamin (MULTIVITAMIN) tablet Take 1 tablet by mouth daily.  ? Nutritional Supplements (EQUATE) LIQD Take by mouth daily.  ? olmesartan (BENICAR) 20 MG tablet Take 1 tablet (20 mg total) by mouth in the morning.  ? Omega 3 1000 MG CAPS Take by mouth.  ? ?No facility-administered encounter medications on file as of 09/14/2021.  ? ? ?Patient Active Problem List  ? Diagnosis Date Noted  ? 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 Pure hypercholesterolemia 04/24/2021  ? OSA (obstructive sleep apnea) 08/15/2016  ? 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 Snoring 07/05/2016  ? Excessive daytime sleepiness 07/05/2016  ? 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 Gait disturbance 04/22/2016  ? Vertigo 04/22/2016  ? Urinary urgency 04/22/2016  ? Spinal stenosis of lumbar region 04/22/2016  ? 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 ? ?Conditions to be addressed/monitored: Hypertensive nephropathy, Pure Hypercholesterolemia, Peripheral artery disease, Spinal stenosis of lumbar region with neurogenic claudication   ? ?Care Plan : RN Care Manager Plan of Care  ?Updates made by Lynne Logan, RN since 09/14/2021 12:00 AM  ?  ? ?Problem: No plan established for management of chronic disease states (Hypertensive nephropathy, Pure Hypercholesterolemia, Peripheral artery disease, Spinal stenosis of lumbar region with neurogenic claudication)   ?Priority: High  ?  ? ?Long-Range Goal: Establishment of plan of care for management of chronic disease states (Hypertensive nephropathy, Pure Hypercholesterolemia, Peripheral artery disease, Spinal stenosis of lumbar region with neurogenic claudication)   ?Start  Date: 06/15/2021  ?Expected End Date: 06/14/2022  ?Recent Progress: On track  ?Priority: High  ?Note:   ?Current Barriers:  ?Knowledge Deficits related to plan of care for management of Hypertensive nephropathy, Pure Hypercholesterolemia, Peripheral artery disease, Spinal  stenosis of lumbar region with neurogenic claudication    ?Chronic Disease Management support and education needs related to Hypertensive nephropathy, Pure Hypercholesterolemia, Peripheral artery disease, Spinal stenosis of lumbar region with neurogenic claudication     ? ?RNCM Clinical Goal(s):  ?Patient will verbalize basic understanding of  Hypertensive nephropathy, Pure Hypercholesterolemia, Peripheral artery disease, Spinal stenosis of lumbar region with neurogenic claudication    disease process and self health management plan as evidenced by patient will report having no disease exacerbations related to his chronic disease states as listed above ?take all medications exactly as prescribed and will call provider for medication related questions as evidenced by demonstrate improved understanding of prescribed medications and rationale for usage as evidenced by patient teach back ?demonstrate Improved health management independence as evidenced by patient will report 100% adherence to his prescribed treatment plan of care  ?continue to work with RN Care Manager to address care management and care coordination needs related to   Hypertensive nephropathy, Pure Hypercholesterolemia, Peripheral artery disease, Spinal stenosis of lumbar region with neurogenic claudication    as evidenced by adherence to CM Team Scheduled appointments ?demonstrate ongoing self health care management ability   as evidenced by    through collaboration with RN Care manager, provider, and care team.  ? ?Interventions: ?1:1 collaboration with primary care provider regarding development and update of comprehensive plan of care as evidenced by provider attestation and co-signature ?Inter-disciplinary care team collaboration (see longitudinal plan of care) ?Evaluation of current treatment plan related to  self management and patient's adherence to plan as established by provider ? ?Hypertension Interventions:  (Status:  Goal on track:  Yes.)  Long Term Goal ?Last practice recorded BP readings:  ?BP Readings from Last 3 Encounters:  ?07/16/21 (!) 147/73  ?04/24/21 136/80  ?04/03/21 134/68  ?Most recent eGFR/CrCl:  ?Lab Results  ?Component Value Date  ? EGFR 80 04/24/2021  ?  No components found for: CRCL ?Evaluation of current treatment plan related to hypertension self management and patient's adherence to plan as established by provider ?Reviewed medications with patient and discussed importance of compliance ?Counseled on the importance of exercise goals with target of 150 minutes per week ?Advised patient, providing education and rationale, to monitor blood pressure daily and record, calling PCP for findings outside established parameters ?Advised patient to discuss new concerns or questions with provider ? ?Pain Interventions:  (Status:  Goal on track:  Yes.) Long Term Goal ?Evaluation of current treatment plan related to chronic pain self management and patient's adherence to plan as established by provider ?Pain assessment performed ?Reviewed provider established plan for pain management ?Determined patient continues to have worsening pain to his left knee, he has scheduled a follow up appointment with Dr. Marlou Sa, orthopedics on 09/28/21 _0 :15 AM ?Determined patient completed outpatient PT with good effectiveness, he continues to adhere to a HEP  ?Discussed importance of adherence to all scheduled medical appointments ?Counseled on the importance of reporting any/all new or changed pain symptoms or management strategies to pain management provider ?Discussed plans with patient for ongoing care management follow up and provided patient with direct contact information for care management team ? ?Patient Goals/Self-Care Activities: ?Take all medications as prescribed ?Attend all scheduled provider appointments ?Call pharmacy for medication refills  3-7 days in advance of running out of medications ?Perform all self care activities independently  ?Call  provider office for new concerns or questions  ?check blood pressure 3 times per week ?keep a blood pressure log ?take blood pressure log to all doctor appointments ?call doctor for signs and symptoms of h

## 2021-09-14 NOTE — Patient Instructions (Signed)
Visit Information ? ?Thank you for taking time to visit with me today. Please don't hesitate to contact me if I can be of assistance to you before our next scheduled telephone appointment. ? ?Following are the goals we discussed today:  ?(Copy and paste patient goals from clinical care plan here) ? ?Our next appointment is by telephone on 10/26/21 at 10:30 AM  ? ?Please call the care guide team at [phone] if you need to cancel or reschedule your appointment.  ? ?If you are experiencing a Mental Health or Behavioral Health Crisis or need someone to talk to, please call 1-800-273-TALK (toll free, 24 hour hotline)  ? ?Patient verbalizes understanding of instructions and care plan provided today and agrees to view in MyChart. Active MyChart status confirmed with patient.   ? ?Delsa Sale, RN, BSN, CCM ?Care Management Coordinator ?[facility]  ?Direct Phone: [phone] ? ? ?

## 2021-09-16 DIAGNOSIS — I129 Hypertensive chronic kidney disease with stage 1 through stage 4 chronic kidney disease, or unspecified chronic kidney disease: Secondary | ICD-10-CM

## 2021-09-16 DIAGNOSIS — E7800 Pure hypercholesterolemia, unspecified: Secondary | ICD-10-CM

## 2021-09-25 DIAGNOSIS — Z23 Encounter for immunization: Secondary | ICD-10-CM | POA: Diagnosis not present

## 2021-09-28 ENCOUNTER — Ambulatory Visit (INDEPENDENT_AMBULATORY_CARE_PROVIDER_SITE_OTHER): Payer: Medicare Other

## 2021-09-28 ENCOUNTER — Encounter: Payer: Self-pay | Admitting: Orthopedic Surgery

## 2021-09-28 ENCOUNTER — Ambulatory Visit (INDEPENDENT_AMBULATORY_CARE_PROVIDER_SITE_OTHER): Payer: Medicare Other | Admitting: Orthopedic Surgery

## 2021-09-28 DIAGNOSIS — M79605 Pain in left leg: Secondary | ICD-10-CM | POA: Diagnosis not present

## 2021-09-28 DIAGNOSIS — M25562 Pain in left knee: Secondary | ICD-10-CM

## 2021-09-28 NOTE — Progress Notes (Signed)
? ?Office Visit Note ?  ?Patient: Chad Miller           ?Date of Birth: [birth year]           ?MRN: [id number] ?Visit Date: 09/28/2021 ?Requested by: [name], MD ?[address] ?[address] ?[place],  [place] [address] ?PCP: [name], MD ? ?Subjective: ?Chief Complaint  ?Patient presents with  ? Left Knee - Pain  ? ? ?HPI: Chad Miller is an 81 year old patient with left knee and leg pain.  Reports worsening pain.  Was last seen in 2021.  He is ambulating with a cane.  Reports some stiffness in the knee.  Had total knee replacement 20 years ago in [place].  Describes decreased walking and standing endurance.  When he gets around the house he has to hold onto different items and chairs to get around.  He is having some balance issues.  He has been doing physical therapy without much improvement.  He had an L-spine MRI in 2020 which shows fairly significant degenerative disc disease as well as spinal stenosis and foraminal stenosis.  Reports quadricep pain on the left radiating down the lateral left leg.  He is taking Tylenol arthritis for his symptoms.  Hard for him to get comfortable at times.  Hard for him to sleep at times. ?             ?ROS: All systems reviewed are negative as they relate to the chief complaint within the history of present illness.  Patient denies  fevers or chills. ? ? ?Assessment & Plan: ?Visit Diagnoses:  ?1. Left knee pain, unspecified chronicity   ?2. Pain in left leg   ? ? ?Plan: Impression is left leg pain which is really most likely coming from his lumbar spine.  He had significant stenosis and arthritis in his back 3 years ago.  That has likely progressed.  His left knee looks excellent today.  I do not think that is the problem.  He needs MRI scan of the lumbar spine to evaluate for left-sided radiculopathy.  He may be heading for either epidural steroid injections and/or surgical decompression.  Follow-up after that study. ? ?Follow-Up Instructions: Return for after MRI.   ? ?Orders:  ?Orders Placed This Encounter  ?Procedures  ? XR KNEE 3 VIEW LEFT  ? MR Lumbar Spine w/o contrast  ? ?No orders of the defined types were placed in this encounter. ? ? ? ? Procedures: ?No procedures performed ? ? ?Clinical Data: ?No additional findings. ? ?Objective: ?Vital Signs: There were no vitals taken for this visit. ? ?Physical Exam:  ? ?Constitutional: Patient appears well-developed ?HEENT:  ?Head: Normocephalic ?Eyes:EOM are normal ?Neck: Normal range of motion ?Cardiovascular: Normal rate ?Pulmonary/chest: Effort normal ?Neurologic: Patient is alert ?Skin: Skin is warm ?Psychiatric: Patient has normal mood and affect ? ? ?Ortho Exam: Ortho exam demonstrates full active and passive range of motion of the ankles hips and left knee.  He has no effusion in the left knee.  Range of motion on the knee is 0 to about 115.  Collaterals are stable to varus and valgus stress at 0 30 and 90 degrees.  Extensor mechanism intact.  No other masses lymphadenopathy or skin changes noted in that left knee region.  He does have good ankle dorsiflexion plantarflexion quad and hamstring strength with no significant muscle atrophy in the legs.  Reflexes symmetric bilateral patella and Achilles.  Pedal pulses palpable. ? ?Specialty Comments:  ?No specialty comments available. ? ?Imaging: ?  XR KNEE 3 VIEW LEFT ? ?Result Date: 09/28/2021 ?AP lateral merchant radiographs left knee reviewed.  Total knee prosthesis in good position alignment with no significant change compared to prior radiographs.  No loosening around the bone cement interface.  No significant wear of the spacer.  Overall alignment intact  ? ? ?PMFS History: ?Patient Active Problem List  ? Diagnosis Date Noted  ? 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 Pure hypercholesterolemia 04/24/2021  ? OSA (obstructive sleep apnea) 08/15/2016  ? 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 Snoring 07/05/2016  ?  Excessive daytime sleepiness 07/05/2016  ? 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 Gait disturbance 04/22/2016  ? Vertigo 04/22/2016  ? Urinary urgency 04/22/2016  ? Spinal stenosis of lumbar region 04/22/2016  ? 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 ?Past Medical History:  ?Diagnosis Date  ? High blood pressure   ? High cholesterol   ? Loss of vision   ? Right  ?  ?Family History  ?Problem Relation Age of Onset  ? Heart disease Mother   ? Lung cancer Father   ? Asthma Sister   ? Heart disease Brother   ? Heart disease Brother   ? Allergic rhinitis Neg Hx   ? Eczema Neg Hx   ? Urticaria Neg Hx   ?  ?Past Surgical History:  ?Procedure Laterality Date  ? CATARACT EXTRACTION    ? REPLACEMENT TOTAL KNEE Left 2004  ? ?Social History  ? ?Occupational History  ? Occupation: Retired  ?Tobacco Use  ? Smoking status: Never  ? Smokeless tobacco: Never  ?Vaping Use  ? Vaping Use: Never used  ?Substance and Sexual Activity  ? Alcohol use: No  ?  Comment: Quit 2015  ? Drug use: No  ? Sexual activity: Yes  ?  Partners: Female  ?  Comment: Married  ? ? ? ? ? ?

## 2021-10-12 ENCOUNTER — Ambulatory Visit
Admission: RE | Admit: 2021-10-12 | Discharge: 2021-10-12 | Disposition: A | Discharge status: Home / Self Care | Payer: Medicare Other | Source: Ambulatory Visit | Attending: Orthopedic Surgery | Admitting: Orthopedic Surgery

## 2021-10-12 DIAGNOSIS — M47816 Spondylosis without myelopathy or radiculopathy, lumbar region: Secondary | ICD-10-CM | POA: Diagnosis not present

## 2021-10-12 DIAGNOSIS — M4126 Other idiopathic scoliosis, lumbar region: Secondary | ICD-10-CM | POA: Diagnosis not present

## 2021-10-12 DIAGNOSIS — M4807 Spinal stenosis, lumbosacral region: Secondary | ICD-10-CM | POA: Diagnosis not present

## 2021-10-12 DIAGNOSIS — M48061 Spinal stenosis, lumbar region without neurogenic claudication: Secondary | ICD-10-CM | POA: Diagnosis not present

## 2021-10-12 DIAGNOSIS — M545 Low back pain, unspecified: Secondary | ICD-10-CM | POA: Diagnosis not present

## 2021-10-12 DIAGNOSIS — M79605 Pain in left leg: Secondary | ICD-10-CM

## 2021-10-24 ENCOUNTER — Ambulatory Visit: Payer: Medicare Other | Admitting: Internal Medicine

## 2021-10-26 ENCOUNTER — Telehealth: Payer: Medicare Other

## 2021-10-26 ENCOUNTER — Ambulatory Visit (INDEPENDENT_AMBULATORY_CARE_PROVIDER_SITE_OTHER): Payer: Medicare Other

## 2021-10-26 DIAGNOSIS — M48062 Spinal stenosis, lumbar region with neurogenic claudication: Secondary | ICD-10-CM

## 2021-10-26 DIAGNOSIS — E7800 Pure hypercholesterolemia, unspecified: Secondary | ICD-10-CM

## 2021-10-26 DIAGNOSIS — I739 Peripheral vascular disease, unspecified: Secondary | ICD-10-CM

## 2021-10-26 DIAGNOSIS — I129 Hypertensive chronic kidney disease with stage 1 through stage 4 chronic kidney disease, or unspecified chronic kidney disease: Secondary | ICD-10-CM

## 2021-10-26 NOTE — Chronic Care Management (AMB) (Signed)
Chronic Care Management   CCM RN Visit Note  10/26/2021 Name: Chad Miller MRN: [id number] DOB: [birth year]  Subjective: Chad Miller is a 81 y.o. year old male who is a primary care patient of [name], MD. The care management team was consulted for assistance with disease management and care coordination needs.    Engaged with patient by telephone for follow up visit in response to provider referral for case management and/or care coordination services.   Consent to Services:  The patient was given information about Chronic Care Management services, agreed to services, and gave verbal consent prior to initiation of services.  Please see initial visit note for detailed documentation.   Patient agreed to services and verbal consent obtained.   Assessment: Review of patient past medical history, allergies, medications, health status, including review of consultants reports, laboratory and other test data, was performed as part of comprehensive evaluation and provision of chronic care management services.   SDOH (Social Determinants of Health) assessments and interventions performed:  yes, no acute needs   CCM Care Plan  No Known Allergies  Outpatient Encounter Medications as of 10/26/2021  Medication Sig   amLODipine (NORVASC) 5 MG tablet Take 1 tablet (5 mg total) by mouth every evening.   atorvastatin (LIPITOR) 10 MG tablet Take 1 tablet (10 mg total) by mouth daily.   azelastine (ASTELIN) 0.1 % nasal spray Place 2 sprays into both nostrils 2 (two) times daily. Use in each nostril as directed   co-enzyme Q-10 30 MG capsule Take 30 mg by mouth daily.   loratadine (CLARITIN) 10 MG tablet Take 10 mg by mouth daily.   Melatonin 5 MG CAPS Take by mouth.   metoprolol succinate (TOPROL-XL) 25 MG 24 hr tablet Take 1 tablet (25 mg total) by mouth daily. Take with or immediately following a meal.   mometasone (NASONEX) 50 MCG/ACT nasal spray Place 2 sprays into the nose daily.    Multiple Vitamin (MULTIVITAMIN) tablet Take 1 tablet by mouth daily.   Nutritional Supplements (EQUATE) LIQD Take by mouth daily.   olmesartan (BENICAR) 20 MG tablet Take 1 tablet (20 mg total) by mouth in the morning.   Omega 3 1000 MG CAPS Take by mouth.   No facility-administered encounter medications on file as of 10/26/2021.    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    Conditions to be addressed/monitored: Hypertensive nephropathy, Pure Hypercholesterolemia, Peripheral artery disease, Spinal stenosis of lumbar region with neurogenic claudication    Care Plan : [place] of Care  Updates made by Lynne Logan, RN since 10/26/2021 12:00 AM     Problem: No plan established for management of chronic disease states (Hypertensive nephropathy, Pure Hypercholesterolemia, Peripheral artery disease, Spinal stenosis of lumbar region with neurogenic claudication)   Priority: High     Long-Range Goal: Establishment of plan of care for management of chronic disease states (Hypertensive nephropathy, Pure Hypercholesterolemia, Peripheral artery disease, Spinal stenosis of lumbar region with neurogenic claudication)   Start  Date: 06/15/2021  Expected End Date: 06/14/2022  Recent Progress: On track  Priority: High  Note:   Current Barriers:  Knowledge Deficits related to plan of care for management of Hypertensive nephropathy, Pure Hypercholesterolemia, Peripheral artery disease, Spinal  stenosis of lumbar region with neurogenic claudication    Chronic Disease Management support and education needs related to Hypertensive nephropathy, Pure Hypercholesterolemia, Peripheral artery disease, Spinal stenosis of lumbar region with neurogenic claudication      RNCM Clinical Goal(s):  Patient will verbalize basic understanding of  Hypertensive nephropathy, Pure Hypercholesterolemia, Peripheral artery disease, Spinal stenosis of lumbar region with neurogenic claudication    disease process and self health management plan as evidenced by patient will report having no disease exacerbations related to his chronic disease states as listed above take all medications exactly as prescribed and will call provider for medication related questions as evidenced by demonstrate improved understanding of prescribed medications and rationale for usage as evidenced by patient teach back demonstrate Improved health management independence as evidenced by patient will report 100% adherence to his prescribed treatment plan of care  continue to work with RN Care Manager to address care management and care coordination needs related to   Hypertensive nephropathy, Pure Hypercholesterolemia, Peripheral artery disease, Spinal stenosis of lumbar region with neurogenic claudication    as evidenced by adherence to CM Team Scheduled appointments demonstrate ongoing self health care management ability   as evidenced by    through collaboration with RN Care manager, provider, and care team.   Interventions: 1:1 collaboration with primary care provider regarding development and update of comprehensive plan of care as evidenced by provider attestation and co-signature Inter-disciplinary care team collaboration (see longitudinal plan of care) Evaluation of current treatment plan related to  self management and patient's adherence to plan as established by provider  Hypertension Interventions:  (Status:  Condition stable.  Not  addressed this visit.) Long Term Goal Last practice recorded BP readings:  BP Readings from Last 3 Encounters:  07/16/21 (!) 147/73  04/24/21 136/80  04/03/21 134/68  Most recent eGFR/CrCl:  Lab Results  Component Value Date   EGFR 80 04/24/2021    No components found for: CRCL Evaluation of current treatment plan related to hypertension self management and patient's adherence to plan as established by provider Reviewed medications with patient and discussed importance of compliance Counseled on the importance of exercise goals with target of 150 minutes per week Advised patient, providing education and rationale, to monitor blood pressure daily and record, calling PCP for findings outside established parameters Advised patient to discuss new concerns or questions with provider  Pain Interventions:  (Status:  Goal on track:  Yes.) Long Term Goal Evaluation of current treatment plan related to chronic pain self management and patient's adherence to plan as established by provider Pain assessment performed Reviewed provider established plan for pain management Determined patient continues to have worsening pain to his left knee, he completed his visit with Dr. Marlou Sa, Orthopedic surgeon  Determined patient completed an MRI of his lumbar spine and is awaiting results Reviewed scheduled/upcoming provider appointment including: next Ortho follow up with Dr. Marlou Sa scheduled for 11/14/21 '@2' :15 PM Assessed for social determinant of health barriers, none identified at this time  Encouraged patient to continue to do daily stretches as directed to help with endurance, and reduce pain  Educated patient on non-pharmacological methods to try such as peppermint essential oil and moist heat, instructed on how to use  Counseled on the importance of  reporting any/all new or changed pain symptoms or management strategies to pain management provider Discussed plans with patient for ongoing care management follow  up and provided patient with direct contact information for care management team  Patient Goals/Self-Care Activities: Take all medications as prescribed Attend all scheduled provider appointments Call pharmacy for medication refills 3-7 days in advance of running out of medications Perform all self care activities independently  Call provider office for new concerns or questions  check blood pressure 3 times per week keep a blood pressure log take blood pressure log to all doctor appointments call doctor for signs and symptoms of high blood pressure take medications for blood pressure exactly as prescribed report new symptoms to your doctor Try organic peppermint essential oil and moist heat as directed, apply to affected area for pain relief  Follow Up Plan:  Telephone follow up appointment with care management team member scheduled for:  01/16/22     Barb Merino, RN, BSN, CCM Care Management Coordinator [facility]  Direct Phone: [phone]

## 2021-10-26 NOTE — Patient Instructions (Signed)
Visit Information  Thank you for taking time to visit with me today. Please don't hesitate to contact me if I can be of assistance to you before our next scheduled telephone appointment.  Following are the goals we discussed today:  (Copy and paste patient goals from clinical care plan here)  Our next appointment is by telephone on 01/16/22 at 12 PM   Please call the care guide team at [phone] if you need to cancel or reschedule your appointment.   If you are experiencing a Mental Health or Behavioral Health Crisis or need someone to talk to, please call 1-800-273-TALK (toll free, 24 hour hotline)   Patient verbalizes understanding of instructions and care plan provided today and agrees to view in MyChart. Active MyChart status and patient understanding of how to access instructions and care plan via MyChart confirmed with patient.     Delsa Sale, RN, BSN, CCM Care Management Coordinator [facility]  Direct Phone: [phone]

## 2021-11-05 ENCOUNTER — Ambulatory Visit: Payer: Medicare Other | Admitting: Orthopedic Surgery

## 2021-11-14 ENCOUNTER — Ambulatory Visit (INDEPENDENT_AMBULATORY_CARE_PROVIDER_SITE_OTHER): Payer: Medicare Other | Admitting: Surgical

## 2021-11-14 DIAGNOSIS — M5410 Radiculopathy, site unspecified: Secondary | ICD-10-CM

## 2021-11-14 DIAGNOSIS — M79605 Pain in left leg: Secondary | ICD-10-CM

## 2021-11-16 DIAGNOSIS — E7800 Pure hypercholesterolemia, unspecified: Secondary | ICD-10-CM

## 2021-11-16 DIAGNOSIS — I129 Hypertensive chronic kidney disease with stage 1 through stage 4 chronic kidney disease, or unspecified chronic kidney disease: Secondary | ICD-10-CM

## 2021-11-19 ENCOUNTER — Encounter: Payer: Self-pay | Admitting: Orthopedic Surgery

## 2021-11-19 NOTE — Progress Notes (Signed)
Office Visit Note   Patient: Chad Miller           Date of Birth: 04-02-1941           MRN: [id number] Visit Date: 11/14/2021 Requested by: [name], MD [address] [place],  [place] [address] PCP: [name], MD  Subjective: Chief Complaint  Patient presents with   Lower Back - Pain    HPI: Chad Miller is a 81 y.o. male who presents to the office for MRI review. Patient denies any changes in symptoms.  Continues to complain mainly of low back pain right greater than left as well as distal anterior left thigh pain that is very painful and sensitive.  Notes decreased mobility which is his biggest concern.  Using cane and a walker at times.  Had PT which did not help with his back pain.              ROS: All systems reviewed are negative as they relate to the chief complaint within the history of present illness.  Patient denies fevers or chills.  Assessment & Plan: Visit Diagnoses:  1. Radicular syndrome of left leg   2. Pain in left leg     Plan: Chad Miller is a 81 y.o. male who presents to the office for evaluation of low back pain with radicular pain and review of lumbar spine MRI.  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  With his continued radicular pain into the left knee region and his decreased mobility, plan for further evaluation with neurosurgery in mid to late July to determine suitability for ESI's versus surgical intervention based on his imaging and symptoms.  Follow-up as needed.  Follow-Up Instructions: No follow-ups on file.   Orders:  Orders Placed This Encounter  Procedures   Ambulatory referral to Neurosurgery   No orders of the defined types were placed in this encounter.     Procedures: No procedures performed   Clinical Data: No additional findings.  Objective: Vital Signs: There  were no vitals taken for this visit.  Physical Exam:  Constitutional: Patient appears well-developed HEENT:  Head: Normocephalic Eyes:EOM are normal Neck: Normal range of motion Cardiovascular: Normal rate Pulmonary/chest: Effort normal Neurologic: Patient is alert Skin: Skin is warm Psychiatric: Patient has normal mood and affect  Ortho Exam: Ortho exam demonstrates excellent strength of hip flexion, quadricep, hamstring, dorsiflexion, plantarflexion rated 5/5.  2+ patellar reflex on the right with barely 1+ on left.  Tender throughout the lower axial lumbar spine, worse on the right sided paraspinal musculature.  Specialty Comments:  No specialty comments available.  Imaging: No results found.   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   Past Medical History:  Diagnosis Date   High blood pressure    High cholesterol    Loss of vision    Right    Family History  Problem  Relation Age of Onset   Heart disease Mother    Lung cancer Father    Asthma Sister    Heart disease Brother    Heart disease Brother    Allergic rhinitis Neg Hx    Eczema Neg Hx    Urticaria Neg Hx     Past Surgical History:  Procedure Laterality Date   CATARACT EXTRACTION     REPLACEMENT TOTAL KNEE Left 2004   Social History   Occupational History   Occupation: Retired  Tobacco Use   Smoking status: Never   Smokeless tobacco: Never  Vaping Use   Vaping Use: Never used   Substance and Sexual Activity   Alcohol use: No    Comment: Quit 2015   Drug use: No   Sexual activity: Yes    Partners: Female    Comment: Married

## 2021-12-13 ENCOUNTER — Encounter: Payer: Self-pay | Admitting: Internal Medicine

## 2021-12-13 ENCOUNTER — Ambulatory Visit (INDEPENDENT_AMBULATORY_CARE_PROVIDER_SITE_OTHER): Payer: Medicare Other | Admitting: Internal Medicine

## 2021-12-13 ENCOUNTER — Other Ambulatory Visit: Payer: Self-pay

## 2021-12-13 VITALS — BP 136/78 | HR 58 | Temp 98.3°F | Ht 70.0 in | Wt 152.8 lb

## 2021-12-13 DIAGNOSIS — M48062 Spinal stenosis, lumbar region with neurogenic claudication: Secondary | ICD-10-CM

## 2021-12-13 DIAGNOSIS — Z6821 Body mass index (BMI) 21.0-21.9, adult: Secondary | ICD-10-CM

## 2021-12-13 DIAGNOSIS — I129 Hypertensive chronic kidney disease with stage 1 through stage 4 chronic kidney disease, or unspecified chronic kidney disease: Secondary | ICD-10-CM | POA: Diagnosis not present

## 2021-12-13 DIAGNOSIS — I739 Peripheral vascular disease, unspecified: Secondary | ICD-10-CM

## 2021-12-13 DIAGNOSIS — M25562 Pain in left knee: Secondary | ICD-10-CM

## 2021-12-13 DIAGNOSIS — N182 Chronic kidney disease, stage 2 (mild): Secondary | ICD-10-CM

## 2021-12-13 DIAGNOSIS — Z79899 Other long term (current) drug therapy: Secondary | ICD-10-CM

## 2021-12-13 DIAGNOSIS — G8929 Other chronic pain: Secondary | ICD-10-CM

## 2021-12-13 DIAGNOSIS — Z23 Encounter for immunization: Secondary | ICD-10-CM

## 2021-12-13 NOTE — Progress Notes (Signed)
Rich Brave [place],acting as a Education administrator for [name], MD.,have documented all relevant documentation on the behalf of [name], MD,as directed by  [name], MD while in the presence of [name], MD.    Subjective:     Patient ID: Chad Miller , male    DOB: [birth year] , 81 y.o.   MRN: [id number]   Chief Complaint  Patient presents with   Hypertension    HPI  He presents today for BP check. He reports compliance with meds. He denies headaches, chest pain and visual disturbances.   Hypertension This is a chronic problem. The current episode started more than 1 year ago. The problem has been gradually improving since onset. The problem is controlled. Pertinent negatives include no blurred vision. Risk factors for coronary artery disease include dyslipidemia and male gender. Past treatments include angiotensin blockers. Hypertensive end-organ damage includes kidney disease.     Past Medical History:  Diagnosis Date   High blood pressure    High cholesterol    Loss of vision    Right     Family History  Problem Relation Age of Onset   Heart disease Mother    Lung cancer Father    Asthma Sister    Heart disease Brother    Heart disease Brother    Allergic rhinitis Neg Hx    Eczema Neg Hx    Urticaria Neg Hx      Current Outpatient Medications:    amLODipine (NORVASC) 5 MG tablet, Take 1 tablet (5 mg total) by mouth every evening., Disp: 90 tablet, Rfl: 3   atorvastatin (LIPITOR) 10 MG tablet, Take 1 tablet (10 mg total) by mouth daily., Disp: 90 tablet, Rfl: 2   azelastine (ASTELIN) 0.1 % nasal spray, Place 2 sprays into both nostrils 2 (two) times daily. Use in each nostril as directed, Disp: 30 mL, Rfl: 5   metoprolol succinate (TOPROL-XL) 25 MG 24 hr tablet, Take 1 tablet (25 mg total) by mouth daily. Take with or immediately following a meal., Disp: 90 tablet, Rfl: 1   NAPROXEN PO, Take 220 mg by mouth daily as needed., Disp: , Rfl:     olmesartan (BENICAR) 20 MG tablet, Take 1 tablet (20 mg total) by mouth in the morning., Disp: 90 tablet, Rfl: 2   peppermint oil liquid, by Does not apply route as needed., Disp: , Rfl:    Nutritional Supplements (EQUATE) LIQD, Take by mouth daily. (Patient not taking: Reported on 12/13/2021), Disp: , Rfl:    No Known Allergies   Review of Systems  Constitutional: Negative.   Eyes: Negative.  Negative for blurred vision.  Respiratory: Negative.    Cardiovascular: Negative.   Gastrointestinal: Negative.   Musculoskeletal:  Positive for arthralgias.  Skin: Negative.   Neurological:  Positive for numbness.       He c/o numbness/tingling in b/l feet. He denies b/l weakness. He has previous h/o spinal stenosis.   Psychiatric/Behavioral: Negative.       Today's Vitals   12/13/21 1140 12/13/21 1210  BP: (!) 142/80 136/78  Pulse: (!) 58   Temp: 98.3 F (36.8 C)   Weight: 152 lb 12.8 oz (69.3 kg)   Height: 5' 10" (1.778 m)   PainSc: 5    PainLoc: Back    Body mass index is 21.92 kg/m.  Wt Readings from Last 3 Encounters:  12/13/21 152 lb 12.8 oz (69.3 kg)  07/16/21 156 lb (70.8 kg)  05/02/21 160 lb (  72.6 kg)     Objective:  Physical Exam Vitals and nursing note reviewed.  Constitutional:      Appearance: Normal appearance.  Eyes:     Extraocular Movements: Extraocular movements intact.  Cardiovascular:     Rate and Rhythm: Normal rate and regular rhythm.     Heart sounds: Normal heart sounds.  Pulmonary:     Effort: Pulmonary effort is normal.     Breath sounds: Normal breath sounds.  Musculoskeletal:     Cervical back: Normal range of motion.  Skin:    General: Skin is warm.  Neurological:     General: No focal deficit present.     Mental Status: He is alert.  Psychiatric:        Mood and Affect: Mood normal.         Assessment And Plan:     1. Hypertensive nephropathy Comments: Chronic, fair control. Goal BP<130/80.  Advised to follow low sodium  He will  c/w olmesartan 32m, amlodipine 5, and metoprolol XL 272mdaily.  - CMP14+EGFR - CBC no Diff - Lipid panel  2. Chronic renal disease, stage II Comments: Chronic, encouraged to stay well hydrated, avoid NSAIDs and keep BP well controlled to decrease risk of CKD progression.  - CMP14+EGFR  3. Peripheral artery disease ([facility] [facility] ManorComments: Chronic, encouraged to aim for at least 150 minutes of exercise per week, goal LDL <70.   4. Spinal stenosis of lumbar region with neurogenic claudication Comments: Recent MRI spine reviewed in detail. I agree with upcoming Neurosurgical eval.   5. Chronic pain of left knee Comments: Chronic, due to knee OA. He is encouraged to follow anti-inflammatory diet, may benefit from ginger/turmeric tea.   6. BMI 21.0-21.9, adult Comments: He is encouraged to aim for at least 150 minutes of exercise per week.   7. Polypharmacy - Vitamin B12 - TSH   Patient was given opportunity to ask questions. Patient verbalized understanding of the plan and was able to repeat key elements of the plan. All questions were answered to their satisfaction.   I, RoMaximino GreenlandMD, have reviewed all documentation for this visit. The documentation on 12/22/21 for the exam, diagnosis, procedures, and orders are all accurate and complete.   IF YOU HAVE BEEN REFERRED TO A SPECIALIST, IT MAY TAKE 1-2 WEEKS TO SCHEDULE/PROCESS THE REFERRAL. IF YOU HAVE NOT HEARD FROM US/SPECIALIST IN TWO WEEKS, PLEASE GIVE [place] CALL AT [phone] X [phone].   THE PATIENT IS ENCOURAGED TO PRACTICE SOCIAL DISTANCING DUE TO THE COVID-19 PANDEMIC.

## 2021-12-13 NOTE — Patient Instructions (Signed)
Hypertension, Adult ?Hypertension is another name for high blood pressure. High blood pressure forces your heart to work harder to pump blood. This can cause problems over time. ?There are two numbers in a blood pressure reading. There is a top number (systolic) over a bottom number (diastolic). It is best to have a blood pressure that is below 120/80. ?What are the causes? ?The cause of this condition is not known. Some other conditions can lead to high blood pressure. ?What increases the risk? ?Some lifestyle factors can make you more likely to develop high blood pressure: ?Smoking. ?Not getting enough exercise or physical activity. ?Being overweight. ?Having too much fat, sugar, calories, or salt (sodium) in your diet. ?Drinking too much alcohol. ?Other risk factors include: ?Having any of these conditions: ?Heart disease. ?Diabetes. ?High cholesterol. ?Kidney disease. ?Obstructive sleep apnea. ?Having a family history of high blood pressure and high cholesterol. ?Age. The risk increases with age. ?Stress. ?What are the signs or symptoms? ?High blood pressure may not cause symptoms. Very high blood pressure (hypertensive crisis) may cause: ?Headache. ?Fast or uneven heartbeats (palpitations). ?Shortness of breath. ?Nosebleed. ?Vomiting or feeling like you may vomit (nauseous). ?Changes in how you see. ?Very bad chest pain. ?Feeling dizzy. ?Seizures. ?How is this treated? ?This condition is treated by making healthy lifestyle changes, such as: ?Eating healthy foods. ?Exercising more. ?Drinking less alcohol. ?Your doctor may prescribe medicine if lifestyle changes do not help enough and if: ?Your top number is above 130. ?Your bottom number is above 80. ?Your personal target blood pressure may vary. ?Follow these instructions at home: ?Eating and drinking ? ?If told, follow the DASH eating plan. To follow this plan: ?Fill one half of your plate at each meal with fruits and vegetables. ?Fill one fourth of your plate  at each meal with whole grains. Whole grains include whole-wheat pasta, brown rice, and whole-grain bread. ?Eat or drink low-fat dairy products, such as skim milk or low-fat yogurt. ?Fill one fourth of your plate at each meal with low-fat (lean) proteins. Low-fat proteins include fish, chicken without skin, eggs, beans, and tofu. ?Avoid fatty meat, cured and processed meat, or chicken with skin. ?Avoid pre-made or processed food. ?Limit the amount of salt in your diet to less than 1,500 mg each day. ?Do not drink alcohol if: ?Your doctor tells you not to drink. ?You are pregnant, may be pregnant, or are planning to become pregnant. ?If you drink alcohol: ?Limit how much you have to: ?0-1 drink a day for women. ?0-2 drinks a day for men. ?Know how much alcohol is in your drink. In the [place] one drink equals one 12 oz bottle of beer (355 mL), one 5 oz glass of wine (148 mL), or one 1? oz glass of hard liquor (44 mL). ?Lifestyle ? ?Work with your doctor to stay at a healthy weight or to lose weight. Ask your doctor what the best weight is for you. ?Get at least 30 minutes of exercise that causes your heart to beat faster (aerobic exercise) most days of the week. This may include walking, swimming, or biking. ?Get at least 30 minutes of exercise that strengthens your muscles (resistance exercise) at least 3 days a week. This may include lifting weights or doing Pilates. ?Do not smoke or use any products that contain nicotine or tobacco. If you need help quitting, ask your doctor. ?Check your blood pressure at home as told by your doctor. ?Keep all follow-up visits. ?Medicines ?Take over-the-counter and prescription medicines   only as told by your doctor. Follow directions carefully. ?Do not skip doses of blood pressure medicine. The medicine does not work as well if you skip doses. Skipping doses also puts you at risk for problems. ?Ask your doctor about side effects or reactions to medicines that you should watch  for. ?Contact a doctor if: ?You think you are having a reaction to the medicine you are taking. ?You have headaches that keep coming back. ?You feel dizzy. ?You have swelling in your ankles. ?You have trouble with your vision. ?Get help right away if: ?You get a very bad headache. ?You start to feel mixed up (confused). ?You feel weak or numb. ?You feel faint. ?You have very bad pain in your: ?Chest. ?Belly (abdomen). ?You vomit more than once. ?You have trouble breathing. ?These symptoms may be an emergency. Get help right away. Call 911. ?Do not wait to see if the symptoms will go away. ?Do not drive yourself to the hospital. ?Summary ?Hypertension is another name for high blood pressure. ?High blood pressure forces your heart to work harder to pump blood. ?For most people, a normal blood pressure is less than 120/80. ?Making healthy choices can help lower blood pressure. If your blood pressure does not get lower with healthy choices, you may need to take medicine. ?This information is not intended to replace advice given to you by your health care provider. Make sure you discuss any questions you have with your health care provider. ?Document Revised: 02/22/2021 Document Reviewed: 02/22/2021 ?Elsevier Patient Education ? 2023 [employer] Inc[employer] ? ?

## 2021-12-14 LAB — CMP14+EGFR
ALT: 23 IU/L (ref 0–44)
AST: 23 IU/L (ref 0–40)
Albumin/Globulin Ratio: 1.3 (ref 1.2–2.2)
Albumin: 3.9 g/dL (ref 3.7–4.7)
Alkaline Phosphatase: 75 IU/L (ref 44–121)
BUN/Creatinine Ratio: 12 (ref 10–24)
BUN: 11 mg/dL (ref 8–27)
Bilirubin Total: 1.2 mg/dL (ref 0.0–1.2)
CO2: 20 mmol/L (ref 20–29)
Calcium: 9.2 mg/dL (ref 8.6–10.2)
Chloride: 105 mmol/L (ref 96–106)
Creatinine, Ser: 0.89 mg/dL (ref 0.76–1.27)
Globulin, Total: 3.1 g/dL (ref 1.5–4.5)
Glucose: 89 mg/dL (ref 70–99)
Potassium: 3.9 mmol/L (ref 3.5–5.2)
Sodium: 139 mmol/L (ref 134–144)
Total Protein: 7 g/dL (ref 6.0–8.5)
eGFR: 86 mL/min/{1.73_m2} (ref >59)

## 2021-12-14 LAB — LIPID PANEL
Chol/HDL Ratio: 2.7 ratio (ref 0.0–5.0)
Cholesterol, Total: 137 mg/dL (ref 100–199)
HDL: 51 mg/dL (ref >39)
LDL Chol Calc (NIH): 76 mg/dL (ref 0–99)
Triglycerides: 43 mg/dL (ref 0–149)
VLDL Cholesterol Cal: 10 mg/dL (ref 5–40)

## 2021-12-14 LAB — CBC
Hematocrit: 42.1 % (ref 37.5–51.0)
Hemoglobin: 14.2 g/dL (ref 13.0–17.7)
MCH: 32.4 pg (ref 26.6–33.0)
MCHC: 33.7 g/dL (ref 31.5–35.7)
MCV: 96 fL (ref 79–97)
Platelets: 181 10*3/uL (ref 150–450)
RBC: 4.38 x10E6/uL (ref 4.14–5.80)
RDW: 13.6 % (ref 11.6–15.4)
WBC: 2.8 10*3/uL — ABNORMAL LOW (ref 3.4–10.8)

## 2021-12-14 LAB — TSH: TSH: 1.78 u[IU]/mL (ref 0.450–4.500)

## 2021-12-14 LAB — VITAMIN B12: Vitamin B-12: 1170 pg/mL (ref 232–1245)

## 2021-12-17 DIAGNOSIS — M4126 Other idiopathic scoliosis, lumbar region: Secondary | ICD-10-CM | POA: Diagnosis not present

## 2021-12-26 ENCOUNTER — Other Ambulatory Visit: Payer: Self-pay

## 2021-12-26 ENCOUNTER — Telehealth: Payer: Self-pay | Admitting: Cardiology

## 2021-12-26 DIAGNOSIS — I10 Essential (primary) hypertension: Secondary | ICD-10-CM

## 2021-12-26 MED ORDER — AMLODIPINE BESYLATE 5 MG PO TABS: 5.0000 mg | ORAL_TABLET | Freq: Every evening | ORAL | 3 refills | Status: DC

## 2021-12-26 MED ORDER — METOPROLOL SUCCINATE ER 25 MG PO TB24: 25.0000 mg | ORAL_TABLET | Freq: Every day | ORAL | 1 refills | Status: DC

## 2021-12-26 NOTE — Telephone Encounter (Signed)
Pt needs refills on amlodipine and metoprolol. He is currently out of the amlodipine, and almost out of his metoprolol. He would like a call back when this has been completed. Pharmacy is the one indicated in his chart, [facility] [address].

## 2022-01-11 NOTE — Progress Notes (Signed)
This encounter was created in error - please disregard.

## 2022-01-14 ENCOUNTER — Encounter: Payer: Self-pay | Admitting: Cardiology

## 2022-01-14 ENCOUNTER — Ambulatory Visit: Payer: Medicare Other | Admitting: Student

## 2022-01-14 ENCOUNTER — Ambulatory Visit: Payer: Medicare Other | Admitting: Cardiology

## 2022-01-14 VITALS — BP 151/84 | HR 58 | Temp 98.3°F | Resp 16 | Ht 70.0 in | Wt 150.0 lb

## 2022-01-14 DIAGNOSIS — I10 Essential (primary) hypertension: Secondary | ICD-10-CM | POA: Diagnosis not present

## 2022-01-14 DIAGNOSIS — I493 Ventricular premature depolarization: Secondary | ICD-10-CM | POA: Diagnosis not present

## 2022-01-14 MED ORDER — NEBIVOLOL HCL 10 MG PO TABS: 10.0000 mg | ORAL_TABLET | ORAL | 2 refills | Status: DC

## 2022-01-14 NOTE — Progress Notes (Signed)
Primary Physician/Referring:  [name], MD  Patient ID: Chad Miller, male    DOB: 26-Feb-1941, 81 y.o.   MRN: [id number]  Chief Complaint  Patient presents with   Hypertension   Hyperlipidemia   Follow-up    6 months   HPI:    Chad Miller  is a 81 y.o. AA male with history of hypertension, palpitations, hyperlipidemia, presents for 82-monthoffice on follow-up of hypertension.  Since being on low-dose metoprolol, he has not had any further palpitations.  Denies chest pain, syncope, near syncope.  Past Medical History:  Diagnosis Date   High cholesterol    Past Surgical History:  Procedure Laterality Date   CATARACT EXTRACTION     REPLACEMENT TOTAL KNEE Left 2004   Family History  Problem Relation Age of Onset   Heart disease Mother    Lung cancer Father    Asthma Sister    Heart disease Brother    Heart disease Brother    Allergic rhinitis Neg Hx    Eczema Neg Hx    Urticaria Neg Hx     Social History   Tobacco Use   Smoking status: Never   Smokeless tobacco: Never  Substance Use Topics   Alcohol use: No    Comment: Quit 2015   Marital Status: Married  ROS  Review of Systems  Cardiovascular:  Negative for chest pain, dyspnea on exertion and leg swelling.   Objective  Blood pressure (!) 151/84, pulse (!) 58, temperature 98.3 F (36.8 C), temperature source Temporal, resp. rate 16, height '5\' 10"'  (1.778 m), weight 150 lb (68 kg), SpO2 97 %.     01/14/2022   12:06 PM 12/13/2021   12:10 PM 12/13/2021   11:40 AM  Vitals with BMI  Height '5\' 10"'   '5\' 10"'   Weight 150 lbs  152 lbs 13 oz  BMI 276.72 209.47 Systolic [id number] Diastolic 84 78 80  Pulse 58  58    Physical Exam Vitals reviewed.  Cardiovascular:     Rate and Rhythm: Normal rate and regular rhythm. No extrasystoles are present.    Pulses:          Popliteal pulses are 2+ on the right side and 1+ on the left side.       Dorsalis pedis pulses are 0 on the right side and 0 on the left  side.       Posterior tibial pulses are 1+ on the right side and 1+ on the left side.     Heart sounds: S1 normal and S2 normal. No murmur heard.    No gallop.     Comments:   Pulmonary:     Effort: Pulmonary effort is normal.     Breath sounds: Normal breath sounds.  Abdominal:     Palpations: Abdomen is soft.  Musculoskeletal:     Right lower leg: Edema (trace ankle) present.     Left lower leg: Edema (trace ankle) present.    Laboratory examination:   Lab Results  Component Value Date   NA 139 12/13/2021   K 3.9 12/13/2021   CO2 20 12/13/2021   GLUCOSE 89 12/13/2021   BUN 11 12/13/2021   CREATININE 0.89 12/13/2021   CALCIUM 9.2 12/13/2021   EGFR 86 12/13/2021   GFRNONAA 76 04/12/2020       Latest Ref Rng & Units 12/13/2021   12:45 PM 04/24/2021    3:10 PM 10/10/2020   11:52 AM  CMP  Glucose 70 - 99 mg/dL 89  93    BUN 8 - 27 mg/dL 11  16    Creatinine 0.76 - 1.27 mg/dL 0.89  0.95    Sodium 134 - 144 mmol/L 139  139    Potassium 3.5 - 5.2 mmol/L 3.9  4.8    Chloride 96 - 106 mmol/L 105  103    CO2 20 - 29 mmol/L 20  24    Calcium 8.6 - 10.2 mg/dL 9.2  9.5    Total Protein 6.0 - 8.5 g/dL 7.0  7.6  8.3   Total Bilirubin 0.0 - 1.2 mg/dL 1.2  1.2  0.8   Alkaline Phos 44 - 121 IU/L 75  78  82   AST 0 - 40 IU/L '23  24  26   ' ALT 0 - 44 IU/L '23  23  27       ' Latest Ref Rng & Units 12/13/2021   12:45 PM 04/24/2021    3:10 PM 04/12/2020   11:56 AM  CBC  WBC 3.4 - 10.8 x10E3/uL 2.8  3.9  3.2   Hemoglobin 13.0 - 17.7 g/dL 14.2  14.8  16.3   Hematocrit 37.5 - 51.0 % 42.1  42.5  47.6   Platelets 150 - 450 x10E3/uL 181  222  216     Lipid Panel Recent Labs    04/24/21 1510 12/13/21 1245  CHOL 147 137  TRIG 43 43  LDLCALC 78 76  HDL 59 51  CHOLHDL 2.5 2.7   HEMOGLOBIN A1C Lab Results  Component Value Date   HGBA1C 5.4 04/01/2019   TSH Recent Labs    12/13/21 1245  TSH 1.780    Allergies  No Known Allergies    Final Medications at End of Visit      Current Outpatient Medications:    amLODipine (NORVASC) 5 MG tablet, Take 1 tablet (5 mg total) by mouth every evening., Disp: 90 tablet, Rfl: 3   atorvastatin (LIPITOR) 10 MG tablet, Take 1 tablet (10 mg total) by mouth daily., Disp: 90 tablet, Rfl: 2   nebivolol (BYSTOLIC) 10 MG tablet, Take 1 tablet (10 mg total) by mouth every morning., Disp: 30 tablet, Rfl: 2   Nutritional Supplements (EQUATE) LIQD, Take by mouth daily., Disp: , Rfl:    olmesartan (BENICAR) 20 MG tablet, Take 1 tablet (20 mg total) by mouth every evening., Disp: 90 tablet, Rfl: 2  Radiology:   No results found.  Chest x-ray 08/16/2020: Cardiac shadow is within normal limits. Tortuous thoracic aorta is again seen. Lungs are well aerated bilaterally. Healing rib fractures are noted on the right. No focal infiltrate or sizable effusion is seen. No acute bony abnormality is seen. IMPRESSION: No acute abnormality noted.  Cardiac Studies:   ABI 08/23/2020:  This exam reveals mildly decreased perfusion of the right lower extremity, noted at the anterior tibial and post tibial artery level (ABI 0.88).  There is severely abnormal monophasic waveform at the right ankle. 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    This exam reveals mildly decreased perfusion of the left lower extremity, noted at the anterior tibial and post tibial artery level (ABI 0.88) with mildly abnormal biphasic waveform at the left ankle.   PCV MYOCARDIAL PERFUSION WITH LEXISCAN 12/13/2020 1 Day Rest/Stress Protocol. Stress EKG is non-diagnostic, as this is pharmacological stress test using Lexiscan. No convincing evidence of reversible myocardial ischemia or prior infarct. Left ventricular ejection fraction is 51% , visually appears normal. Left ventricular wall thickness  is preserved without regional wall motion abnormalities. Low risk study.  PCV ECHOCARDIOGRAM COMPLETE 07/04/2021 Left ventricle cavity is normal in size. Moderate concentric hypertrophy of  the left ventricle. Normal global wall motion. Normal LV systolic function with EF 55%. Doppler evidence of grade I (impaired) diastolic dysfunction, normal LAP. 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 Structurally normal trileaflet aortic valve. No evidence of aortic stenosis. Mild (Grade I) aortic regurgitation. normal right atrial pressure. Previous study in 2013 reported trace AI, MR,TR, Possible PFO reported then not well appreciated om this study.  EKG:   EKG 01/14/2022: Normal sinus rhythm at rate of 48 bpm, normal axis, right bundle branch block.  LVH.  No evidence of ischemia, normal QT interval.  No change from 07/16/2021.  Assessment     ICD-10-CM   1. Primary hypertension  I10 nebivolol (BYSTOLIC) 10 MG tablet    olmesartan (BENICAR) 20 MG tablet    2. PVC (premature ventricular contraction)  I49.3 EKG 12-Lead    nebivolol (BYSTOLIC) 10 MG tablet       Medications Discontinued During This Encounter  Medication Reason   azelastine (ASTELIN) 0.1 % nasal spray    peppermint oil liquid    NAPROXEN PO    metoprolol succinate (TOPROL-XL) 25 MG 24 hr tablet Change in therapy   olmesartan (BENICAR) 20 MG tablet      Meds ordered this encounter  Medications   nebivolol (BYSTOLIC) 10 MG tablet    Sig: Take 1 tablet (10 mg total) by mouth every morning.    Dispense:  30 tablet    Refill:  2    Discontinue Metoprolol    Orders Placed This Encounter  Procedures   EKG 12-Lead    Recommendations:   Chad Miller is a 81 y.o. AA male with history of hypertension, palpitations, hyperlipidemia, presents for 32-monthoffice on follow-up of hypertension.  Since being on low-dose metoprolol, he has not had any further palpitations.  On review, his blood pressure is not well controlled.  I would like to discontinue metoprolol succinate low-dose 25 mg and switch him to Bystolic 10 mg.  With regard to hyperlipidemia, he is tolerating  atorvastatin without any side effects, LDL is at goal.  In the absence of diabetes, known vascular disease, LDL goal <100 and closer to 70 is appropriate.  I would like to see him back in 2 months for follow-up of hypertension and palpitations, if he remains stable I will see him back on a as needed basis.     JAdrian Prows MD, FAvera [facility]8/28/2023, 12:54 PM Office: [phone]: [phone]: [phone]

## 2022-01-16 ENCOUNTER — Ambulatory Visit: Payer: Self-pay

## 2022-01-16 NOTE — Progress Notes (Signed)
This encounter was created in error - please disregard.

## 2022-01-16 NOTE — Patient Instructions (Signed)
Visit Information  Thank you for taking time to visit with me today. Please don't hesitate to contact me if I can be of assistance to you.   Following are the goals we discussed today:   Goals Addressed     Patient Stated     I would like to review my medications (pt-stated)        Care Coordination Interventions: Evaluation of current treatment plan related to hypertension self management and patient's adherence to plan as established by provider Reviewed medications with patient and discussed importance of compliance Advised patient, providing education and rationale, to monitor blood pressure daily and record, calling PCP for findings outside established parameters Instructed patient to notify his Cardiologist and or PCP for new symptoms or concerns    Our next appointment is by telephone on 02/06/22 at 09:45 AM  Please call the care guide team at [phone] if you need to cancel or reschedule your appointment.   If you are experiencing a Mental Health or Behavioral Health Crisis or need someone to talk to, please call 1-800-273-TALK (toll free, 24 hour hotline)  Patient verbalizes understanding of instructions and care plan provided today and agrees to view in MyChart. Active MyChart status and patient understanding of how to access instructions and care plan via MyChart confirmed with patient.     Delsa Sale, RN, BSN, CCM Care Management Coordinator [facility] Direct Phone: [phone]

## 2022-01-16 NOTE — Patient Outreach (Signed)
  Care Coordination   Follow Up Visit Note   01/16/2022 Name: Chad Miller MRN: [id number] DOB: May 28, 1940  Chad Miller is a 81 y.o. year old male who sees [name], MD for primary care. I spoke with  [name] by phone today.  What matters to the patients health and wellness today?  Patient would like to review his medications.     Goals Addressed     Patient Stated     I would like to review my medications (pt-stated)        Care Coordination Interventions: Evaluation of current treatment plan related to hypertension self management and patient's adherence to plan as established by provider Reviewed medications with patient and discussed importance of compliance Advised patient, providing education and rationale, to monitor blood pressure daily and record, calling PCP for findings outside established parameters Instructed patient to notify his Cardiologist and or PCP for new symptoms or concerns     SDOH assessments and interventions completed:  No     Care Coordination Interventions Activated:  Yes  Care Coordination Interventions:  Yes, provided   Follow up plan: Follow up call scheduled for 02/06/22 @09 :45 AM     Encounter Outcome:  Pt. Visit Completed

## 2022-02-04 DIAGNOSIS — Z23 Encounter for immunization: Secondary | ICD-10-CM | POA: Diagnosis not present

## 2022-02-06 ENCOUNTER — Ambulatory Visit: Payer: Self-pay

## 2022-02-06 NOTE — Patient Instructions (Signed)
Visit Information  Thank you for taking time to visit with me today. Please don't hesitate to contact me if I can be of assistance to you.   Following are the goals we discussed today:   Goals Addressed               This Visit's Progress     Patient Stated     I would like to review my medications (pt-stated)        Care Coordination Interventions: Evaluation of current treatment plan related to hypertension self management and patient's adherence to plan as established by provider Reviewed medications with patient and discussed importance of compliance Advised patient, providing education and rationale, to monitor blood pressure at least 3 times weekly and record, calling PCP for findings outside established parameters Provided education on prescribed diet low Sodium  Educated on importance of changing positions slowly to allow BP time to adjust          My back pain is my biggest priority (pt-stated)        Care Coordination Interventions: Reviewed provider established plan for pain management Counseled on the importance of reporting any/all new or changed pain symptoms or management strategies to pain management provider Advised patient to report to care team affect of pain on daily activities Discussed use of relaxation techniques and/or diversional activities to assist with pain reduction (distraction, imagery, relaxation, massage, acupressure, TENS, heat, and cold application Reviewed with patient prescribed pharmacological and nonpharmacological pain relief strategies Advised patient to discuss recommendations for Osteopathic Manipulative therapy with provider           Our next appointment is by telephone on 03/18/22 at 10:30 AM   Please call the care guide team at [phone] if you need to cancel or reschedule your appointment.   If you are experiencing a Mental Health or [place] or need someone to talk to, please call 1-800-273-TALK (toll  free, 24 hour hotline)  Patient verbalizes understanding of instructions and care plan provided today and agrees to view in [place]. Active MyChart status and patient understanding of how to access instructions and care plan via MyChart confirmed with patient.     Barb Merino, RN, BSN, CCM Care Management Coordinator [facility] [facility]  Direct Phone: [phone]

## 2022-02-06 NOTE — Patient Outreach (Signed)
  Care Coordination   Follow Up Visit Note   02/06/2022 Name: Chad Chad MRN: [id number] DOB: [birth year]  Chad Chad is a 81 y.o. year old male who sees Chad Chard, MD for primary care. I spoke with  Chad Chad by phone today.  What matters to the patients health and wellness today?  Patient is most concerned about his back pain and mobility.     Goals Addressed               This Visit's Progress     Patient Stated     I would like to review my medications (pt-stated)        Care Coordination Interventions: Evaluation of current treatment plan related to hypertension self management and patient's adherence to plan as established by provider Reviewed medications with patient and discussed importance of compliance Advised patient, providing education and rationale, to monitor blood pressure at least 3 times weekly and record, calling PCP for findings outside established parameters Provided education on prescribed diet low Sodium  Educated on importance of changing positions slowly to allow BP time to adjust          My back pain is my biggest priority (pt-stated)        Care Coordination Interventions: Reviewed provider established plan for pain management Counseled on the importance of reporting any/all new or changed pain symptoms or management strategies to pain management provider Advised patient to report to care team affect of pain on daily activities Discussed use of relaxation techniques and/or diversional activities to assist with pain reduction (distraction, imagery, relaxation, massage, acupressure, TENS, heat, and cold application Reviewed with patient prescribed pharmacological and nonpharmacological pain relief strategies Advised patient to discuss recommendations for Osteopathic Manipulative therapy with provider           SDOH assessments and interventions completed:  No     Care Coordination Interventions Activated:  Yes  Care  Coordination Interventions:  Yes, provided   Follow up plan: Follow up call scheduled for 03/18/22 @10 :30 AM    Encounter Outcome:  Pt. Visit Completed

## 2022-02-11 DIAGNOSIS — Z23 Encounter for immunization: Secondary | ICD-10-CM | POA: Diagnosis not present

## 2022-03-15 ENCOUNTER — Encounter: Payer: Self-pay | Admitting: Cardiology

## 2022-03-15 ENCOUNTER — Ambulatory Visit: Payer: Medicare Other | Admitting: Cardiology

## 2022-03-15 ENCOUNTER — Other Ambulatory Visit: Payer: Self-pay | Admitting: Internal Medicine

## 2022-03-15 VITALS — BP 128/78 | HR 58 | Resp 16 | Ht 70.0 in | Wt 149.4 lb

## 2022-03-15 DIAGNOSIS — I493 Ventricular premature depolarization: Secondary | ICD-10-CM | POA: Diagnosis not present

## 2022-03-15 DIAGNOSIS — I10 Essential (primary) hypertension: Secondary | ICD-10-CM

## 2022-03-15 DIAGNOSIS — R002 Palpitations: Secondary | ICD-10-CM

## 2022-03-15 MED ORDER — NEBIVOLOL HCL 10 MG PO TABS: 10.0000 mg | ORAL_TABLET | ORAL | 1 refills | Status: DC

## 2022-03-15 NOTE — Progress Notes (Signed)
Primary Physician/Referring:  [name], MD  Patient ID: Chad Miller, male    DOB: 20-Oct-1940, 81 y.o.   MRN: [id number]  Chief Complaint  Patient presents with   Hypertension   Palpitations   Follow-up    2 months   HPI:    Chad Miller  is a 81 y.o. AA male with history of hypertension, palpitations due to occasional PVCs, hyperlipidemia, presents for 6-week  follow-up of hypertension and palpitations.    On his last office visit I discontinued metoprolol as his blood pressure was not well controlled and switch him to Bystolic and in view of bradycardia I did not want to go up on the dose of the metoprolol.  He has not had any further palpitations.  Denies chest pain, syncope, near syncope.  Past Medical History:  Diagnosis Date   High cholesterol    Past Surgical History:  Procedure Laterality Date   CATARACT EXTRACTION     REPLACEMENT TOTAL KNEE Left 2004   Family History  Problem Relation Age of Onset   Heart disease Mother    Lung cancer Father    Asthma Sister    Heart disease Brother    Heart disease Brother    Allergic rhinitis Neg Hx    Eczema Neg Hx    Urticaria Neg Hx     Social History   Tobacco Use   Smoking status: Never   Smokeless tobacco: Never  Substance Use Topics   Alcohol use: No    Comment: Quit 2015   Marital Status: Married  ROS  Review of Systems  Cardiovascular:  Negative for chest pain, dyspnea on exertion and leg swelling.   Objective  Blood pressure (!) 159/71, pulse (!) 58, resp. rate 16, height _0  (1.778 m), weight 149 lb 6.4 oz (67.8 kg), SpO2 100 %.     03/15/2022   11:00 AM 01/14/2022   12:06 PM 12/13/2021   12:10 PM  Vitals with BMI  Height _1  _2    Weight 149 lbs 6 oz 150 lbs   BMI 12.75 17.00   Systolic 174 944 967  Diastolic 71 84 78  Pulse 58 58     Physical Exam Vitals reviewed.  Cardiovascular:     Rate and Rhythm: Normal rate and regular rhythm. No extrasystoles are present.     Pulses:          Popliteal pulses are 2+ on the right side and 1+ on the left side.       Dorsalis pedis pulses are 0 on the right side and 0 on the left side.       Posterior tibial pulses are 1+ on the right side and 1+ on the left side.     Heart sounds: S1 normal and S2 normal. No murmur heard.    No gallop.     Comments:   Pulmonary:     Effort: Pulmonary effort is normal.     Breath sounds: Normal breath sounds.  Abdominal:     Palpations: Abdomen is soft.  Musculoskeletal:     Right lower leg: Edema (trace ankle) present.     Left lower leg: Edema (trace ankle) present.    Laboratory examination:   Lab Results  Component Value Date   NA 139 12/13/2021   K 3.9 12/13/2021   CO2 20 12/13/2021   GLUCOSE 89 12/13/2021   BUN 11 12/13/2021   CREATININE 0.89 12/13/2021   CALCIUM 9.2 12/13/2021   EGFR 86  12/13/2021   GFRNONAA 76 04/12/2020       Latest Ref Rng & Units 12/13/2021   12:45 PM 04/24/2021    3:10 PM 10/10/2020   11:52 AM  CMP  Glucose 70 - 99 mg/dL 89  93    BUN 8 - 27 mg/dL 11  16    Creatinine 0.76 - 1.27 mg/dL 0.89  0.95    Sodium 134 - 144 mmol/L 139  139    Potassium 3.5 - 5.2 mmol/L 3.9  4.8    Chloride 96 - 106 mmol/L 105  103    CO2 20 - 29 mmol/L 20  24    Calcium 8.6 - 10.2 mg/dL 9.2  9.5    Total Protein 6.0 - 8.5 g/dL 7.0  7.6  8.3   Total Bilirubin 0.0 - 1.2 mg/dL 1.2  1.2  0.8   Alkaline Phos 44 - 121 IU/L 75  78  82   AST 0 - 40 IU/L _0 ALT 0 - 44 IU/L _1 Latest Ref Rng & Units 12/13/2021   12:45 PM 04/24/2021    3:10 PM 04/12/2020   11:56 AM  CBC  WBC 3.4 - 10.8 x10E3/uL 2.8  3.9  3.2   Hemoglobin 13.0 - 17.7 g/dL 14.2  14.8  16.3   Hematocrit 37.5 - 51.0 % 42.1  42.5  47.6   Platelets 150 - 450 x10E3/uL 181  222  216     Lipid Panel Recent Labs    04/24/21 1510 12/13/21 1245  CHOL 147 137  TRIG 43 43  LDLCALC 78 76  HDL 59 51  CHOLHDL 2.5 2.7   HEMOGLOBIN A1C Lab Results  Component Value Date    HGBA1C 5.4 04/01/2019   TSH Recent Labs    12/13/21 1245  TSH 1.780    Allergies  No Known Allergies    Final Medications at End of Visit     Current Outpatient Medications:    amLODipine (NORVASC) 5 MG tablet, Take 1 tablet (5 mg total) by mouth every evening., Disp: 90 tablet, Rfl: 3   atorvastatin (LIPITOR) 10 MG tablet, Take 1 tablet (10 mg total) by mouth daily., Disp: 90 tablet, Rfl: 2   nebivolol (BYSTOLIC) 10 MG tablet, Take 1 tablet (10 mg total) by mouth every morning., Disp: 30 tablet, Rfl: 2   olmesartan (BENICAR) 20 MG tablet, Take 1 tablet (20 mg total) by mouth every evening., Disp: 90 tablet, Rfl: 2   Nutritional Supplements (EQUATE) LIQD, Take by mouth daily., Disp: , Rfl:   Radiology:   No results found.  Chest x-ray 08/16/2020: Cardiac shadow is within normal limits. Tortuous thoracic aorta is again seen. Lungs are well aerated bilaterally. Healing rib fractures are noted on the right. No focal infiltrate or sizable effusion is seen. No acute bony abnormality is seen. IMPRESSION: No acute abnormality noted.  Cardiac Studies:   ABI 08/23/2020:  This exam reveals mildly decreased perfusion of the right lower extremity, noted at the anterior tibial and post tibial artery level (ABI 0.88).  There is severely abnormal monophasic waveform at the right ankle. 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    This exam reveals mildly decreased perfusion of the left lower extremity, noted at the anterior tibial and post tibial artery level (ABI 0.88) with mildly abnormal biphasic waveform at the left ankle.   PCV MYOCARDIAL PERFUSION WITH LEXISCAN 12/13/2020 1 Day Rest/Stress Protocol.  Stress EKG is non-diagnostic, as this is pharmacological stress test using Lexiscan. No convincing evidence of reversible myocardial ischemia or prior infarct. Left ventricular ejection fraction is 51% , visually appears normal. Left ventricular wall thickness is preserved without regional wall motion  abnormalities. Low risk study.  PCV ECHOCARDIOGRAM COMPLETE 07/04/2021 Left ventricle cavity is normal in size. Moderate concentric hypertrophy of the left ventricle. Normal global wall motion. Normal LV systolic function with EF 55%. Doppler evidence of grade I (impaired) diastolic dysfunction, normal LAP. 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 Structurally normal trileaflet aortic valve. No evidence of aortic stenosis. Mild (Grade I) aortic regurgitation. normal right atrial pressure. Previous study in 2013 reported trace AI, MR,TR, Possible PFO reported then not well appreciated om this study.  EKG:   EKG 01/14/2022: Normal sinus rhythm at rate of 48 bpm, normal axis, right bundle branch block.  LVH.  No evidence of ischemia, normal QT interval.  No change from 07/16/2021.  Assessment     ICD-10-CM   1. Primary hypertension  I10     2. Palpitations  R00.2        There are no discontinued medications.    No orders of the defined types were placed in this encounter.   No orders of the defined types were placed in this encounter.   Recommendations:   Chad Miller is a 81 y.o. AA male with history of hypertension, palpitations due to occasional PVCs, hyperlipidemia, presents for 6-week  follow-up of hypertension and palpitations.    On his last office visit I discontinued metoprolol as his blood pressure was not well controlled and switch him to Bystolic and in view of bradycardia I did not want to go up on the dose of the metoprolol.  He is tolerating 10 mg of Bystolic with excellent control of blood pressure.  He also remains asymptomatic without any palpitations.  Otherwise stable from cardiac standpoint, lipids are well controlled.  Continue present medications. He remains stable I will see him back on a as needed basis.     Chad Prows, MD, [facility] 03/15/2022, 11:07 AM Office: [phone] Fax: [phone] Pager: [phone]

## 2022-03-18 ENCOUNTER — Ambulatory Visit: Payer: Self-pay

## 2022-03-18 NOTE — Patient Outreach (Signed)
  Care Coordination   03/18/2022 Name: Chad Miller MRN: [id number] DOB: 25-Apr-1941   Care Coordination Outreach Attempts:  An unsuccessful telephone outreach was attempted for a scheduled appointment today.  Follow Up Plan:  Additional outreach attempts will be made to offer the patient care coordination information and services.   Encounter Outcome:  Pt. Request to Call Back  Care Coordination Interventions Activated:  No   Care Coordination Interventions:  No, not indicated    Barb Merino, RN, BSN, CCM Care Management Coordinator [facility] Direct Phone: [phone]

## 2022-04-08 ENCOUNTER — Ambulatory Visit: Payer: Self-pay

## 2022-04-08 NOTE — Patient Instructions (Signed)
Visit Information  Thank you for taking time to visit with me today. Please don't hesitate to contact me if I can be of assistance to you.   Following are the goals we discussed today:   Goals Addressed               This Visit's Progress     Patient Stated     I am dealing with some constipation (pt-stated)        Care Coordination Interventions: Evaluation of current treatment plan related to constipation and patient's adherence to plan as established by provider Determined patient feels he is dealing with more constipation since having started a new BP medication Educated patient regarding self management of this condition, including, increasing his daily water intake, increase physical activity, increase his dietary fiber and try bowel training to get him on a bowel schedule Mailed printed educational materials to patient related to Constipation  Sent in basket message to Dr. Allyne Gee regarding patient's c/o of constipation and the education provided today  Instructed patient to keep Dr. Allyne Gee well informed if this condition is persistent or worsens         I would like to review my medications (pt-stated)        Care Coordination Interventions: Evaluation of current treatment plan related to hypertension self management and patient's adherence to plan as established by provider Determined patient continues to have concerns about his schedule for BP medications Reviewed medications with patient including indication, dosage and frequency Educated patient regarding the need to continue to self monitor his BP at home and record readings, educated on target BP goal  Reinforced the importance to restrict Sodium intake by avoid eating processed foods, deli meats, sausage, bacon, and limit eating out Discussed having patient speak with Arturo Morton to discuss his current BP medication schedule for confirmation patient's current BP schedule is the best option        Our  next appointment is by telephone on 05/08/22 at 230 PM  Please call the care guide team at [phone] if you need to cancel or reschedule your appointment.   If you are experiencing a Mental Health or Behavioral Health Crisis or need someone to talk to, please call 1-800-273-TALK (toll free, 24 hour hotline)  Patient verbalizes understanding of instructions and care plan provided today and agrees to view in MyChart. Active MyChart status and patient understanding of how to access instructions and care plan via MyChart confirmed with patient.     Delsa Sale, RN, BSN, CCM Care Management Coordinator [facility] Direct Phone: [phone]

## 2022-04-08 NOTE — Patient Outreach (Signed)
  Care Coordination   Follow Up Visit Note   04/08/2022 Name: Chad Miller MRN: [id number] DOB: [birth year]  Chad Miller is a 81 y.o. year old male who sees [name], MD for primary care. I spoke with  [name] by phone today.  What matters to the patients health and wellness today?  Patient would like to speak with the pharmacist regarding his BP medication schedule. Patient is dealing with some constipation.     Goals Addressed               This Visit's Progress     Patient Stated     I am dealing with some constipation (pt-stated)        Care Coordination Interventions: Evaluation of current treatment plan related to constipation and patient's adherence to plan as established by provider Determined patient feels he is dealing with more constipation since having started a new BP medication Educated patient regarding self management of this condition, including, increasing his daily water intake, increase physical activity, increase his dietary fiber and try bowel training to get him on a bowel schedule Mailed printed educational materials to patient related to Constipation  Sent in basket message to Dr. Allyne Gee regarding patient's c/o of constipation and the education provided today  Instructed patient to keep Dr. Allyne Gee well informed if this condition is persistent or worsens         I would like to review my medications (pt-stated)        Care Coordination Interventions: Evaluation of current treatment plan related to hypertension self management and patient's adherence to plan as established by provider Determined patient continues to have concerns about his schedule for BP medications Reviewed medications with patient including indication, dosage and frequency Educated patient regarding the need to continue to self monitor his BP at home and record readings, educated on target BP goal  Reinforced the importance to restrict Sodium intake by avoid eating  processed foods, deli meats, sausage, bacon, and limit eating out Discussed having patient speak with Arturo Morton to discuss his current BP medication schedule for confirmation patient's current BP schedule is the best option        SDOH assessments and interventions completed:  No     Care Coordination Interventions Activated:  Yes  Care Coordination Interventions:  Yes, provided   Follow up plan: Follow up call scheduled for 05/08/22 @230  PM    Encounter Outcome:  Pt. Visit Completed

## 2022-04-10 ENCOUNTER — Ambulatory Visit: Payer: Self-pay

## 2022-04-10 ENCOUNTER — Other Ambulatory Visit: Payer: Self-pay | Admitting: Internal Medicine

## 2022-04-10 DIAGNOSIS — M48062 Spinal stenosis, lumbar region with neurogenic claudication: Secondary | ICD-10-CM

## 2022-04-10 DIAGNOSIS — I129 Hypertensive chronic kidney disease with stage 1 through stage 4 chronic kidney disease, or unspecified chronic kidney disease: Secondary | ICD-10-CM

## 2022-04-10 DIAGNOSIS — I739 Peripheral vascular disease, unspecified: Secondary | ICD-10-CM

## 2022-04-10 DIAGNOSIS — N182 Chronic kidney disease, stage 2 (mild): Secondary | ICD-10-CM

## 2022-04-10 DIAGNOSIS — E7800 Pure hypercholesterolemia, unspecified: Secondary | ICD-10-CM

## 2022-04-10 NOTE — Patient Instructions (Signed)
Visit Information  Thank you for taking time to visit with me today. Please don't hesitate to contact me if I can be of assistance to you.   Following are the goals we discussed today:   Goals Addressed               This Visit's Progress     Patient Stated     I am dealing with some constipation (pt-stated)        Care Coordination Interventions: Evaluation of current treatment plan related to constipation and patient's adherence to plan as established by provider Instructed patient per PCP to try magnesium citrate (CALM) nightly, advised patient he can find this product on [place] Educated patient about the provider referral exercise program (PREP) to help build stamina, endurance and help manage chronic conditions such as muscle/joint pain and constipation  Determined patient will consider starting this program and will let his PCP and or this RN know about his decision          Our next appointment is by telephone on 05/08/22 at 2:30 PM  Please call the care guide team at [phone] if you need to cancel or reschedule your appointment.   If you are experiencing a Mental Health or Behavioral Health Crisis or need someone to talk to, please call 1-800-273-TALK (toll free, 24 hour hotline)  Patient verbalizes understanding of instructions and care plan provided today and agrees to view in MyChart. Active MyChart status and patient understanding of how to access instructions and care plan via MyChart confirmed with patient.     Delsa Sale, RN, BSN, CCM Care Management Coordinator [facility] Care Management Direct Phone: [phone]

## 2022-04-10 NOTE — Patient Outreach (Signed)
  Care Coordination   Follow Up Visit Note   04/10/2022 Name: Chad Miller MRN: [id number] DOB: 1940-08-07  Chad Miller is a 81 y.o. year old male who sees [name], MD for primary care. I spoke with  [name] by phone today.  What matters to the patients health and wellness today?  Patient will start magnesium citrate (CALM) as directed by PCP. He will consider starting the PREP program.     Goals Addressed               This Visit's Progress     Patient Stated     I am dealing with some constipation (pt-stated)        Care Coordination Interventions: Evaluation of current treatment plan related to constipation and patient's adherence to plan as established by provider Instructed patient per PCP to try magnesium citrate (CALM) nightly, advised patient he can find this product on [place] Educated patient about the provider referral exercise program (PREP) to help build stamina, endurance and help manage chronic conditions such as muscle/joint pain and constipation  Determined patient will consider starting this program and will let his PCP and or this RN know about his decision          SDOH assessments and interventions completed:  No     Care Coordination Interventions Activated:  Yes  Care Coordination Interventions:  Yes, provided   Follow up plan: Follow up call scheduled for 05/08/22 @2 :30 PM     Encounter Outcome:  Pt. Visit Completed

## 2022-04-17 ENCOUNTER — Telehealth: Payer: Self-pay

## 2022-04-17 NOTE — Progress Notes (Unsigned)
  Chronic Care Management   Note  04/17/2022 Name: Sidney Kann MRN: [id number] DOB: 1940/10/16  Jenaro Souder is a 81 y.o. year old male who is a primary care patient of [name], MD. I reached out to [name] by phone today in response to a referral sent by Mr. Yvon Gessel's PCP.  Mr. Robin Pafford was not successfully contacted today. A HIPAA compliant voice message was left requesting a return call.   Follow up plan: Additional outreach attempts will be made.  Penne Lash, RMA Care Guide [facility]  [place], [place] [address] Direct Dial: [phone] [email or web address]

## 2022-04-18 NOTE — Progress Notes (Signed)
  Chronic Care Management   Note  04/18/2022 Name: Chad Miller MRN: [id number] DOB: 1941/01/24  Chad Miller is a 81 y.o. year old male who is a primary care patient of [name], MD. I reached out to [name] by phone today in response to a referral sent by Chad Miller's PCP.  Chad Miller  agreedto scheduling an appointment with the CCM RN Case Manager and Pharm D   Follow up plan: Patient agreed to scheduled appointment with RN Case Manager on 05/08/2022 and Pharm D 05/17/2022.   Penne Lash, RMA Care Guide [facility]  [place], [place] [address] Direct Dial: [phone] [email or web address]

## 2022-05-03 ENCOUNTER — Ambulatory Visit: Payer: Self-pay

## 2022-05-03 NOTE — Patient Outreach (Signed)
  Care Coordination   Follow Up Visit Note   05/03/2022 Name: Chad Miller MRN: [id number] DOB: [birth year]  Chad Miller is a 81 y.o. year old male who sees [name], MD for primary care. I completed patient's CC goals today due to patient has transitioned to CCM.    Goals Addressed               This Visit's Progress     Patient Stated     COMPLETED: I am dealing with some constipation (pt-stated)        Care Coordination Interventions: Goal outcome unknown, patient transitioned to CCM        COMPLETED: I would like to review my medications (pt-stated)        Care Coordination Interventions: Goal outcome unknown, patient transitioned to CCM       COMPLETED: My back pain is my biggest priority (pt-stated)        Care Coordination Interventions: Goal outcome unknown, patient transitioned to CCM          SDOH assessments and interventions completed:  No     Care Coordination Interventions:  Yes, provided   Follow up plan: No further intervention required.   Encounter Outcome:  Pt. Visit Completed

## 2022-05-08 ENCOUNTER — Ambulatory Visit: Payer: Medicare Other | Admitting: *Deleted

## 2022-05-08 DIAGNOSIS — E7800 Pure hypercholesterolemia, unspecified: Secondary | ICD-10-CM

## 2022-05-08 DIAGNOSIS — I10 Essential (primary) hypertension: Secondary | ICD-10-CM

## 2022-05-08 NOTE — Chronic Care Management (AMB) (Signed)
Chronic Care Management   CCM RN Visit Note  05/08/2022 Name: Chad Miller MRN: [id number] DOB: [birth year]  Subjective: Chad Miller is a 81 y.o. year old male who is a primary care patient of [name], MD. The patient was referred to the Chronic Care Management team for assistance with care management needs subsequent to provider initiation of CCM services and plan of care.    Today's Visit:  Engaged with patient by telephone for initial visit.     SDOH Interventions Today    Flowsheet Row Most Recent Value  SDOH Interventions   Food Insecurity Interventions Intervention Not Indicated  Housing Interventions Intervention Not Indicated  Transportation Interventions Intervention Not Indicated  Utilities Interventions Intervention Not Indicated  Financial Strain Interventions Intervention Not Indicated  Physical Activity Interventions Patient Refused  Stress Interventions Intervention Not Indicated  Social Connections Interventions Patient Refused         Goals Addressed             This Visit's Progress    CCM (HYPERLIPIDEMIA) EXPECTED OUTCOME: MONITOR, SELF-MANAGE AND REDUCE SYMPTOMS OF HYPERLIPIDEMIA       Current Barriers:  Knowledge Deficits related to Hyperlipidemia management Chronic Disease Management support and education needs related to Hyperlipidemia and diet Patient reports he has all medications and taking as prescribed, does stretching at least 5 days per week  Planned Interventions: Provider established cholesterol goals reviewed; Counseled on importance of regular laboratory monitoring as prescribed; Provided HLD educational materials; Reviewed role and benefits of statin for ASCVD risk reduction; Reviewed importance of limiting foods high in cholesterol; Reviewed exercise goals and target of 150 minutes per week; Screening for signs and symptoms of depression related to chronic disease state;  Assessed social determinant of health barriers;   Reviewed upcoming scheduled appointments- 05/17/22 pharmD to call at 1030 am, 06/17/22 primary care provider visit at 1140 am  Symptom Management: Take medications as prescribed   Attend all scheduled provider appointments Call pharmacy for medication refills 3-7 days in advance of running out of medications Perform all self care activities independently  Perform IADL's (shopping, preparing meals, housekeeping, managing finances) independently Call provider office for new concerns or questions  - call for medicine refill 2 or 3 days before it runs out - take all medications exactly as prescribed - call doctor with any symptoms you believe are related to your medicine - call doctor when you experience any new symptoms - go to all doctor appointments as scheduled - adhere to prescribed diet: heart healthy, look over education sent via my chart- heart healthy diet - develop an exercise routine Look over education sent via my chart- heart healthy diet Pharmacist to call 05/17/22 at 1030 am Visit scheduled with primary care provider 06/17/22 at 1140 am  Follow Up Plan: Telephone follow up appointment with care management team member scheduled for:  07/11/22 at 1045 am       CCM (HYPERTENSION) EXPECTED OUTCOME: MONITOR, SELF-MANAGE AND REDUCE SYMPTOMS OF HYPERTENSION       Current Barriers:  Knowledge Deficits related to Hypertension management Chronic Disease Management support and education needs related to Hypertension, diet Patient reports he lives with spouse and adult son, is overall independent with all aspects of his care, continues to drive, checks blood pressure every other day and states " readings are usually good"  Planned Interventions: Evaluation of current treatment plan related to hypertension self management and patient's adherence to plan as established by provider;   Reviewed prescribed diet low sodium  Reviewed medications with patient and discussed importance of  compliance;  Counseled on the importance of exercise goals with target of 150 minutes per week Discussed plans with patient for ongoing care management follow up and provided patient with direct contact information for care management team; Advised patient, providing education and rationale, to monitor blood pressure daily and record, calling PCP for findings outside established parameters;  Advised patient to discuss any issues with blood pressure with provider; Provided education on prescribed diet low sodium;  Discussed complications of poorly controlled blood pressure such as heart disease, stroke, circulatory complications, vision complications, kidney impairment, sexual dysfunction;  Screening for signs and symptoms of depression related to chronic disease state;  Assessed social determinant of health barriers;   Symptom Management: Take medications as prescribed   Attend all scheduled provider appointments Call pharmacy for medication refills 3-7 days in advance of running out of medications Perform all self care activities independently  Perform IADL's (shopping, preparing meals, housekeeping, managing finances) independently Call provider office for new concerns or questions  check blood pressure daily choose a place to take my blood pressure (home, clinic or office, retail store) write blood pressure results in a log or diary learn about high blood pressure keep a blood pressure log take blood pressure log to all doctor appointments call doctor for signs and symptoms of high blood pressure keep all doctor appointments take medications for blood pressure exactly as prescribed begin an exercise program report new symptoms to your doctor eat more whole grains, fruits and vegetables, lean meats and healthy fats Look over education sent via my chart- low sodium diet  Follow Up Plan: Telephone follow up appointment with care management team member scheduled for:  07/11/22 at 1045  am          Plan:Telephone follow up appointment with care management team member scheduled for:  07/11/22 at 1045 am  Irving Shows [facility], BSN RN Case Manager [facility] [phone]

## 2022-05-08 NOTE — Plan of Care (Signed)
Chronic Care Management Provider Comprehensive Care Plan    05/08/2022 Name: Chad Miller MRN: [id number] DOB: 04/08/1941  Referral to Chronic Care Management (CCM) services was placed by Provider:  [name] MD on Date: 04/10/22.  Chronic Condition 1: HYPERTENSION Provider Assessment and Plan Chad Miller is a 81 y.o. AA male with history of hypertension, palpitations due to occasional PVCs, hyperlipidemia, presents for 6-week  follow-up of hypertension and palpitations.     On his last office visit I discontinued metoprolol as his blood pressure was not well controlled and switch him to Bystolic and in view of bradycardia I did not want to go up on the dose of the metoprolol.  He is tolerating 10 mg of Bystolic with excellent control of blood pressure.  He also remains asymptomatic without any palpitations.   Expected Outcome/Goals Addressed This Visit (Provider CCM goals/Provider Assessment and plan  CCM (HYPERTENSION) EXPECTED OUTCOME: MONITOR, SELF-MANAGE AND REDUCE SYMPTOMS OF HYPERTENSION  Symptom Management Condition 1: Take medications as prescribed   Attend all scheduled provider appointments Call pharmacy for medication refills 3-7 days in advance of running out of medications Perform all self care activities independently  Perform IADL's (shopping, preparing meals, housekeeping, managing finances) independently Call provider office for new concerns or questions  check blood pressure daily choose a place to take my blood pressure (home, clinic or office, retail store) write blood pressure results in a log or diary learn about high blood pressure keep a blood pressure log take blood pressure log to all doctor appointments call doctor for signs and symptoms of high blood pressure keep all doctor appointments take medications for blood pressure exactly as prescribed begin an exercise program report new symptoms to your doctor eat more whole grains, fruits and  vegetables, lean meats and healthy fats Look over education sent via my chart- low sodium diet  Chronic Condition 2: HYPERLIPIDEMIA Provider Assessment and Plan Otherwise stable from cardiac standpoint, lipids are well controlled.  Continue present medications. He remains stable I will see him back on a as needed basis.    Expected Outcome/Goals Addressed This Visit (Provider CCM goals/Provider Assessment and plan  CCM (HYPERLIPIDEMIA) EXPECTED OUTCOME: MONITOR, SELF-MANAGE AND REDUCE SYMPTOMS OF HYPERLIPIDEMIA  Symptom Management Condition 2: Take medications as prescribed   Attend all scheduled provider appointments Call pharmacy for medication refills 3-7 days in advance of running out of medications Perform all self care activities independently  Perform IADL's (shopping, preparing meals, housekeeping, managing finances) independently Call provider office for new concerns or questions  - call for medicine refill 2 or 3 days before it runs out - take all medications exactly as prescribed - call doctor with any symptoms you believe are related to your medicine - call doctor when you experience any new symptoms - go to all doctor appointments as scheduled - adhere to prescribed diet: heart healthy, look over education sent via my chart- heart healthy diet - develop an exercise routine Look over education sent via my chart- heart healthy diet Pharmacist to call 05/17/22 at 1030 am Visit scheduled with primary care provider 06/17/22 at 1140 am  Problem List Patient Active Problem List   Diagnosis Date Noted   Primary hypertension 01/14/2022   Chronic pain of left knee 12/13/2021   BMI 21.0-21.9, adult 12/13/2021   Peripheral artery disease (HCC) 12/13/2021   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   Chronic renal disease, 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   Pure hypercholesterolemia 04/24/2021   OSA (obstructive sleep apnea) 08/15/2016   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    Snoring 07/05/2016   Excessive daytime sleepiness 07/05/2016   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   Gait disturbance 04/22/2016   Vertigo 04/22/2016   Urinary urgency 04/22/2016   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, Disp: , Rfl:    olmesartan (BENICAR) 20 MG tablet, TAKE 1 TABLET EVERY MORNING, Disp: 90 tablet, Rfl: 2  Cognitive Assessment Identity Confirmed: : Name; DOB Cognitive Status: Normal Other:  : spoke with patient   Functional Assessment Hearing Difficulty or Deaf: no Wear Glasses or Blind: yes Vision Management: reading glasses Concentrating, Remembering or Making Decisions Difficulty (CP): no Difficulty Communicating: no Difficulty Eating/Swallowing: no Walking or Climbing Stairs Difficulty: no (uses railing) Dressing/Bathing Difficulty: no Doing Errands Independently Difficulty (such as shopping) (CP): no   Caregiver Assessment  Primary Source of Support/Comfort: spouse Name of Support/Comfort Primary Source: spouse Chad Miller People in Home: spouse; child(ren), adult Name(s) of People in Home: spouse Chad Miller and adult son   Planned Interventions  Provider established cholesterol goals reviewed; Counseled on importance of regular laboratory monitoring as prescribed; Provided HLD educational materials; Reviewed role and benefits of statin for ASCVD  risk reduction; Reviewed importance of limiting foods high in cholesterol; Reviewed exercise goals and target of 150 minutes per week; Screening for signs and symptoms of depression related to chronic disease state;  Assessed social determinant of health barriers;  Reviewed upcoming scheduled appointments- 05/17/22 pharmD to call at 1030 am, 06/17/22 primary care provider visit at 1140 am Evaluation of current treatment plan related to hypertension self management and patient's adherence to plan as established by provider;   Reviewed prescribed diet low sodium Reviewed medications with patient and discussed importance of compliance;  Counseled on the importance of exercise goals with target of 150 minutes per week Discussed plans with patient for ongoing care management follow up and provided patient with direct contact information for care management team; Advised patient, providing education and rationale, to monitor blood pressure daily and record, calling PCP for findings outside established parameters;  Advised patient to discuss any issues with blood pressure with provider; Provided education on prescribed diet low sodium;  Discussed complications of poorly controlled blood pressure such as heart disease, stroke, circulatory complications, vision complications, kidney impairment, sexual dysfunction;  Screening for signs and symptoms of depression related to chronic disease state;  Assessed social determinant of health barriers;   Interaction and coordination with outside resources, practitioners, and providers See CCM Referral  Care Plan: Available in MyChart

## 2022-05-08 NOTE — Patient Instructions (Signed)
Please call the care guide team at [phone] if you need to cancel or reschedule your appointment.   If you are experiencing a Mental Health or [place] or need someone to talk to, please call the Suicide and Crisis Lifeline: 988 call the [place] National Suicide Prevention Lifeline: 807 887 8184 or TTY: (640)241-5192 TTY 919 736 5366) to talk to a trained counselor call 1-800-273-TALK (toll free, 24 hour hotline) go to [facility] [address], [place] [phone]) call the [facility]: [phone] call 911   Following is a copy of the CCM Program Consent:  CCM service includes personalized support from designated clinical staff supervised by the physician, including individualized plan of care and coordination with other care providers 24/7 contact phone numbers for assistance for urgent and routine care needs. Service will only be billed when office clinical staff spend 20 minutes or more in a month to coordinate care. Only one practitioner may furnish and bill the service in a calendar month. The patient may stop CCM services at amy time (effective at the end of the month) by phone call to the office staff. The patient will be responsible for cost sharing (co-pay) or up to 20% of the service fee (after annual deductible is met)  Following is a copy of your full provider care plan:   Goals Addressed             This Visit's Progress    CCM (HYPERLIPIDEMIA) EXPECTED OUTCOME: MONITOR, SELF-MANAGE AND REDUCE SYMPTOMS OF HYPERLIPIDEMIA       Current Barriers:  Knowledge Deficits related to Hyperlipidemia management Chronic Disease Management support and education needs related to Hyperlipidemia and diet Patient reports he has all medications and taking as prescribed, does stretching at least 5 days per week  Planned Interventions: Provider established cholesterol goals reviewed; Counseled on importance of  regular laboratory monitoring as prescribed; Provided HLD educational materials; Reviewed role and benefits of statin for ASCVD risk reduction; Reviewed importance of limiting foods high in cholesterol; Reviewed exercise goals and target of 150 minutes per week; Screening for signs and symptoms of depression related to chronic disease state;  Assessed social determinant of health barriers;  Reviewed upcoming scheduled appointments- 05/17/22 pharmD to call at 73 am, 06/17/22 primary care provider visit at 1140 am  Symptom Management: Take medications as prescribed   Attend all scheduled provider appointments Call pharmacy for medication refills 3-7 days in advance of running out of medications Perform all self care activities independently  Perform IADL's (shopping, preparing meals, housekeeping, managing finances) independently Call provider office for new concerns or questions  - call for medicine refill 2 or 3 days before it runs out - take all medications exactly as prescribed - call doctor with any symptoms you believe are related to your medicine - call doctor when you experience any new symptoms - go to all doctor appointments as scheduled - adhere to prescribed diet: heart healthy, look over education sent via my chart- heart healthy diet - develop an exercise routine Look over education sent via my chart- heart healthy diet Pharmacist to call 05/17/22 at 1030 am Visit scheduled with primary care provider 06/17/22 at 1140 am  Follow Up Plan: Telephone follow up appointment with care management team member scheduled for:  07/11/22 at 1045 am       CCM (HYPERTENSION) EXPECTED OUTCOME: MONITOR, SELF-MANAGE AND REDUCE SYMPTOMS OF HYPERTENSION       Current Barriers:  Knowledge Deficits related to Hypertension management  Chronic Disease Management support and education needs related to Hypertension, diet Patient reports he lives with spouse and adult son, is overall independent  with all aspects of his care, continues to drive, checks blood pressure every other day and states " readings are usually good"  Planned Interventions: Evaluation of current treatment plan related to hypertension self management and patient's adherence to plan as established by provider;   Reviewed prescribed diet low sodium Reviewed medications with patient and discussed importance of compliance;  Counseled on the importance of exercise goals with target of 150 minutes per week Discussed plans with patient for ongoing care management follow up and provided patient with direct contact information for care management team; Advised patient, providing education and rationale, to monitor blood pressure daily and record, calling PCP for findings outside established parameters;  Advised patient to discuss any issues with blood pressure with provider; Provided education on prescribed diet low sodium;  Discussed complications of poorly controlled blood pressure such as heart disease, stroke, circulatory complications, vision complications, kidney impairment, sexual dysfunction;  Screening for signs and symptoms of depression related to chronic disease state;  Assessed social determinant of health barriers;   Symptom Management: Take medications as prescribed   Attend all scheduled provider appointments Call pharmacy for medication refills 3-7 days in advance of running out of medications Perform all self care activities independently  Perform IADL's (shopping, preparing meals, housekeeping, managing finances) independently Call provider office for new concerns or questions  check blood pressure daily choose a place to take my blood pressure (home, clinic or office, retail store) write blood pressure results in a log or diary learn about high blood pressure keep a blood pressure log take blood pressure log to all doctor appointments call doctor for signs and symptoms of high blood pressure keep  all doctor appointments take medications for blood pressure exactly as prescribed begin an exercise program report new symptoms to your doctor eat more whole grains, fruits and vegetables, lean meats and healthy fats Look over education sent via my chart- low sodium diet  Follow Up Plan: Telephone follow up appointment with care management team member scheduled for:  07/11/22 at 1045 am          Patient verbalizes understanding of instructions and care plan provided today and agrees to view in [place]. Active MyChart status and patient understanding of how to access instructions and care plan via MyChart confirmed with patient.     Telephone follow up appointment with care management team member scheduled for:  07/11/22 at 25 am  Heart-Healthy Eating Plan Eating a healthy diet is important for the health of your heart. A heart-healthy eating plan includes: Eating less unhealthy fats. Eating more healthy fats. Eating less salt in your food. Salt is also called sodium. Making other changes in your diet. Talk with your doctor or a diet specialist (dietitian) to create an eating plan that is right for you. What is my plan? Your doctor may recommend an eating plan that includes: Total fat: ______% or less of total calories a day. Saturated fat: ______% or less of total calories a day. Cholesterol: less than _________mg a day. Sodium: less than _________mg a day. What are tips for following this plan? Cooking Avoid frying your food. Try to bake, boil, grill, or broil it instead. You can also reduce fat by: Removing the skin from poultry. Removing all visible fats from meats. Steaming vegetables in water or broth. Meal planning  At meals, divide your plate  into four equal parts: Fill one-half of your plate with vegetables and green salads. Fill one-fourth of your plate with whole grains. Fill one-fourth of your plate with lean protein foods. Eat 2-4 cups of vegetables per day.  One cup of vegetables is: 1 cup (91 g) broccoli or cauliflower florets. 2 medium carrots. 1 large bell pepper. 1 large sweet potato. 1 large tomato. 1 medium white potato. 2 cups (150 g) raw leafy greens. Eat 1-2 cups of fruit per day. One cup of fruit is: 1 small apple 1 large banana 1 cup (237 g) mixed fruit, 1 large orange,  cup (82 g) dried fruit, 1 cup (240 mL) 100% fruit juice. Eat more foods that have soluble fiber. These are apples, broccoli, carrots, beans, peas, and barley. Try to get 20-30 g of fiber per day. Eat 4-5 servings of nuts, legumes, and seeds per week: 1 serving of dried beans or legumes equals  cup (90 g) cooked. 1 serving of nuts is  oz (12 almonds, 24 pistachios, or 7 walnut halves). 1 serving of seeds equals  oz (8 g). General information Eat more home-cooked food. Eat less restaurant, buffet, and fast food. Limit or avoid alcohol. Limit foods that are high in starch and sugar. Avoid fried foods. Lose weight if you are overweight. Keep track of how much salt (sodium) you eat. This is important if you have high blood pressure. Ask your doctor to tell you more about this. Try to add vegetarian meals each week. Fats Choose healthy fats. These include olive oil and canola oil, flaxseeds, walnuts, almonds, and seeds. Eat more omega-3 fats. These include salmon, mackerel, sardines, tuna, flaxseed oil, and ground flaxseeds. Try to eat fish at least 2 times each week. Check food labels. Avoid foods with trans fats or high amounts of saturated fat. Limit saturated fats. These are often found in animal products, such as meats, butter, and cream. These are also found in plant foods, such as palm oil, palm kernel oil, and coconut oil. Avoid foods with partially hydrogenated oils in them. These have trans fats. Examples are stick margarine, some tub margarines, cookies, crackers, and other baked goods. What foods should I eat? Fruits All fresh, canned (in  natural juice), or frozen fruits. Vegetables Fresh or frozen vegetables (raw, steamed, roasted, or grilled). Green salads. Grains Most grains. Choose whole wheat and whole grains most of the time. Rice and pasta, including brown rice and pastas made with whole wheat. Meats and other proteins Lean, well-trimmed beef, veal, pork, and lamb. Chicken and [place] without skin. All fish and shellfish. Wild duck, rabbit, pheasant, and venison. Egg whites or low-cholesterol egg substitutes. Dried beans, peas, lentils, and tofu. Seeds and most nuts. Dairy Low-fat or nonfat cheeses, including ricotta and mozzarella. Skim or 1% milk that is liquid, powdered, or evaporated. Buttermilk that is made with low-fat milk. Nonfat or low-fat yogurt. Fats and oils Non-hydrogenated (trans-free) margarines. Vegetable oils, including soybean, sesame, sunflower, olive, peanut, safflower, corn, canola, and cottonseed. Salad dressings or mayonnaise made with a vegetable oil. Beverages Mineral water. Coffee and tea. Diet carbonated beverages. Sweets and desserts Sherbet, gelatin, and fruit ice. Small amounts of dark chocolate. Limit all sweets and desserts. Seasonings and condiments All seasonings and condiments. The items listed above may not be a complete list of foods and drinks you can eat. Contact a dietitian for more options. What foods should I avoid? Fruits Canned fruit in heavy syrup. Fruit in cream or butter sauce. Fried fruit.  Limit coconut. Vegetables Vegetables cooked in cheese, cream, or butter sauce. Fried vegetables. Grains Breads that are made with saturated or trans fats, oils, or whole milk. Croissants. Sweet rolls. Donuts. High-fat crackers, such as cheese crackers. Meats and other proteins Fatty meats, such as hot dogs, ribs, sausage, bacon, rib-eye roast or steak. High-fat deli meats, such as salami and bologna. Caviar. Domestic duck and goose. Organ meats, such as liver. Dairy Cream, sour  cream, cream cheese, and creamed cottage cheese. Whole-milk cheeses. Whole or 2% milk that is liquid, evaporated, or condensed. Whole buttermilk. Cream sauce or high-fat cheese sauce. Yogurt that is made from whole milk. Fats and oils Meat fat, or shortening. Cocoa butter, hydrogenated oils, palm oil, coconut oil, palm kernel oil. Solid fats and shortenings, including bacon fat, salt pork, lard, and butter. Nondairy cream substitutes. Salad dressings with cheese or sour cream. Beverages Regular sodas and juice drinks with added sugar. Sweets and desserts Frosting. Pudding. Cookies. Cakes. Pies. Milk chocolate or white chocolate. Buttered syrups. Full-fat ice cream or ice cream drinks. The items listed above may not be a complete list of foods and drinks to avoid. Contact a dietitian for more information. Summary Heart-healthy meal planning includes eating less unhealthy fats, eating more healthy fats, and making other changes in your diet. Eat a balanced diet. This includes fruits and vegetables, low-fat or nonfat dairy, lean protein, nuts and legumes, whole grains, and heart-healthy oils and fats. This information is not intended to replace advice given to you by your health care provider. Make sure you discuss any questions you have with your health care provider. Document Revised: 06/11/2021 Document Reviewed: 06/11/2021 Elsevier Patient Education  [place]. Low-Sodium Eating Plan Sodium, which is an element that makes up salt, helps you maintain a healthy balance of fluids in your body. Too much sodium can increase your blood pressure and cause fluid and waste to be held in your body. Your health care provider or dietitian may recommend following this plan if you have high blood pressure (hypertension), kidney disease, liver disease, or heart failure. Eating less sodium can help lower your blood pressure, reduce swelling, and protect your heart, liver, and kidneys. What are tips for  following this plan? Reading food labels The Nutrition Facts label lists the amount of sodium in one serving of the food. If you eat more than one serving, you must multiply the listed amount of sodium by the number of servings. Choose foods with less than 140 mg of sodium per serving. Avoid foods with 300 mg of sodium or more per serving. Shopping  Look for lower-sodium products, often labeled as "low-sodium" or "no salt added." Always check the sodium content, even if foods are labeled as "unsalted" or "no salt added." Buy fresh foods. Avoid canned foods and pre-made or frozen meals. Avoid canned, cured, or processed meats. Buy breads that have less than 80 mg of sodium per slice. Cooking  Eat more home-cooked food and less restaurant, buffet, and fast food. Avoid adding salt when cooking. Use salt-free seasonings or herbs instead of table salt or sea salt. Check with your health care provider or pharmacist before using salt substitutes. Cook with plant-based oils, such as canola, sunflower, or olive oil. Meal planning When eating at a restaurant, ask that your food be prepared with less salt or no salt, if possible. Avoid dishes labeled as brined, pickled, cured, smoked, or made with soy sauce, miso, or teriyaki sauce. Avoid foods that contain MSG (monosodium glutamate).  MSG is sometimes added to [place] food, bouillon, and some canned foods. Make meals that can be grilled, baked, poached, roasted, or steamed. These are generally made with less sodium. General information Most people on this plan should limit their sodium intake to 1,500-2,000 mg (milligrams) of sodium each day. What foods should I eat? Fruits Fresh, frozen, or canned fruit. Fruit juice. Vegetables Fresh or frozen vegetables. "No salt added" canned vegetables. "No salt added" tomato sauce and paste. Low-sodium or reduced-sodium tomato and vegetable juice. Grains Low-sodium cereals, including oats, puffed wheat and  rice, and shredded wheat. Low-sodium crackers. Unsalted rice. Unsalted pasta. Low-sodium bread. Whole-grain breads and whole-grain pasta. Meats and other proteins Fresh or frozen (no salt added) meat, poultry, seafood, and fish. Low-sodium canned tuna and salmon. Unsalted nuts. Dried peas, beans, and lentils without added salt. Unsalted canned beans. Eggs. Unsalted nut butters. Dairy Milk. Soy milk. Cheese that is naturally low in sodium, such as ricotta cheese, fresh mozzarella, or Swiss cheese. Low-sodium or reduced-sodium cheese. Cream cheese. Yogurt. Seasonings and condiments Fresh and dried herbs and spices. Salt-free seasonings. Low-sodium mustard and ketchup. Sodium-free salad dressing. Sodium-free light mayonnaise. Fresh or refrigerated horseradish. Lemon juice. Vinegar. Other foods Homemade, reduced-sodium, or low-sodium soups. Unsalted popcorn and pretzels. Low-salt or salt-free chips. The items listed above may not be a complete list of foods and beverages you can eat. Contact a dietitian for more information. What foods should I avoid? Vegetables Sauerkraut, pickled vegetables, and relishes. Olives. [place] fries. Onion rings. Regular canned vegetables (not low-sodium or reduced-sodium). Regular canned tomato sauce and paste (not low-sodium or reduced-sodium). Regular tomato and vegetable juice (not low-sodium or reduced-sodium). Frozen vegetables in sauces. Grains Instant hot cereals. Bread stuffing, pancake, and biscuit mixes. Croutons. Seasoned rice or pasta mixes. Noodle soup cups. Boxed or frozen macaroni and cheese. Regular salted crackers. Self-rising flour. Meats and other proteins Meat or fish that is salted, canned, smoked, spiced, or pickled. Precooked or cured meat, such as sausages or meat loaves. Berniece Salines. Ham. Pepperoni. Hot dogs. Corned beef. Chipped beef. Salt pork. Jerky. Pickled herring. Anchovies and sardines. Regular canned tuna. Salted nuts. Dairy Processed cheese and  cheese spreads. Hard cheeses. Cheese curds. Blue cheese. Feta cheese. String cheese. Regular cottage cheese. Buttermilk. Canned milk. Fats and oils Salted butter. Regular margarine. Ghee. Bacon fat. Seasonings and condiments Onion salt, garlic salt, seasoned salt, table salt, and sea salt. Canned and packaged gravies. Worcestershire sauce. Tartar sauce. Barbecue sauce. Teriyaki sauce. Soy sauce, including reduced-sodium. Steak sauce. Fish sauce. Oyster sauce. Cocktail sauce. Horseradish that you find on the shelf. Regular ketchup and mustard. Meat flavorings and tenderizers. Bouillon cubes. Hot sauce. Pre-made or packaged marinades. Pre-made or packaged taco seasonings. Relishes. Regular salad dressings. Salsa. Other foods Salted popcorn and pretzels. Corn chips and puffs. Potato and tortilla chips. Canned or dried soups. Pizza. Frozen entrees and pot pies. The items listed above may not be a complete list of foods and beverages you should avoid. Contact a dietitian for more information. Summary Eating less sodium can help lower your blood pressure, reduce swelling, and protect your heart, liver, and kidneys. Most people on this plan should limit their sodium intake to 1,500-2,000 mg (milligrams) of sodium each day. Canned, boxed, and frozen foods are high in sodium. Restaurant foods, fast foods, and pizza are also very high in sodium. You also get sodium by adding salt to food. Try to cook at home, eat more fresh fruits and vegetables, and eat less fast food  and canned, processed, or prepared foods. This information is not intended to replace advice given to you by your health care provider. Make sure you discuss any questions you have with your health care provider. Document Revised: 06/11/2019 Document Reviewed: 04/07/2019 Elsevier Patient Education  [place].

## 2022-05-10 ENCOUNTER — Telehealth: Payer: Self-pay

## 2022-05-10 NOTE — Progress Notes (Signed)
05-10-2022: left VM to reschedule 05-17-2022 initial appointment with Chad Miller. Appointment canceled. Patient called back and was rescheduled to 06-14-2021.  Huey Romans [facility] Clinical Pharmacist Assistant [phone]

## 2022-05-16 ENCOUNTER — Ambulatory Visit (INDEPENDENT_AMBULATORY_CARE_PROVIDER_SITE_OTHER): Payer: Medicare Other

## 2022-05-16 VITALS — Ht 70.0 in | Wt 160.0 lb

## 2022-05-16 DIAGNOSIS — Z0000 Encounter for general adult medical examination without abnormal findings: Secondary | ICD-10-CM | POA: Diagnosis not present

## 2022-05-16 NOTE — Progress Notes (Signed)
I connected with Chad Chickliver Deviney today by telephone and verified that I am speaking with the correct person using two identifiers. Location patient: home Location provider: work Persons participating in the virtual visit: Chad ChickOliver Kho, Elisha Ponderickeah Aditi Rovira LPN.   I discussed the limitations, risks, security and privacy concerns of performing an evaluation and management service by telephone and the availability of in person appointments. I also discussed with the patient that there may be a patient responsible charge related to this service. The patient expressed understanding and verbally consented to this telephonic visit.    Interactive audio and video telecommunications were attempted between this provider and patient, however failed, due to patient having technical difficulties OR patient did not have access to video capability.  We continued and completed visit with audio only.     Vital signs may be patient reported or missing.  Subjective:   Chad Miller is a 81 y.o. male who presents for Medicare Annual/Subsequent preventive examination.  Review of Systems     Cardiac Risk Factors include: advanced age (>4355men, 46>65 women);hypertension;male gender     Objective:    Today's Vitals   05/16/22 1456 05/16/22 1457  Weight: 160 lb (72.6 kg)   Height: 5\' 10"  (1.778 m)   PainSc:  5    Body mass index is 22.96 kg/m.     05/16/2022    3:10 PM 05/08/2022   10:05 AM 07/02/2021    8:58 AM 05/02/2021   12:21 PM 11/02/2020   10:03 AM 04/12/2020   10:28 AM 04/01/2019    2:30 PM  Advanced Directives  Does Patient Have a Medical Advance Directive? Yes Yes Yes Yes Yes Yes Yes  Type of Estate agentAdvance Directive Healthcare Power of GallowayAttorney;Living will Living will;Healthcare Power of State Street Corporationttorney Healthcare Power of DawsonAttorney;Living will Healthcare Power of OlaAttorney;Living will Living will;Healthcare Power of State Street Corporationttorney Healthcare Power of BurdenAttorney;Living will Healthcare Power of TatumsAttorney;Living will   Does patient want to make changes to medical advance directive?  No - Patient declined   No - Patient declined    Copy of Healthcare Power of Attorney in Chart? No - copy requested No - copy requested No - copy requested No - copy requested No - copy requested No - copy requested No - copy requested    Current Medications (verified) Outpatient Encounter Medications as of 05/16/2022  Medication Sig   acetaminophen (TYLENOL) 325 MG tablet Take 650 mg by mouth every 6 (six) hours as needed.   amLODipine (NORVASC) 5 MG tablet Take 1 tablet (5 mg total) by mouth every evening.   atorvastatin (LIPITOR) 10 MG tablet Take 1 tablet (10 mg total) by mouth daily.   nebivolol (BYSTOLIC) 10 MG tablet Take 1 tablet (10 mg total) by mouth every morning.   Nutritional Supplements (EQUATE) LIQD Take by mouth daily.   olmesartan (BENICAR) 20 MG tablet TAKE 1 TABLET EVERY MORNING   No facility-administered encounter medications on file as of 05/16/2022.    Allergies (verified) Patient has no known allergies.   History: Past Medical History:  Diagnosis Date   High cholesterol    Past Surgical History:  Procedure Laterality Date   CATARACT EXTRACTION     REPLACEMENT TOTAL KNEE Left 2004   Family History  Problem Relation Age of Onset   Heart disease Mother    Lung cancer Father    Asthma Sister    Heart disease Brother    Heart disease Brother    Allergic rhinitis Neg Hx    Eczema Neg  Hx    Urticaria Neg Hx    Social History   Socioeconomic History   Marital status: Married    Spouse name: Not on file   Number of children: 2   Years of education: College   Highest education level: Not on file  Occupational History   Occupation: Retired  Tobacco Use   Smoking status: Never   Smokeless tobacco: Never  Vaping Use   Vaping Use: Never used  Substance and Sexual Activity   Alcohol use: No    Comment: Quit 2015   Drug use: No   Sexual activity: Yes    Partners: Female    Comment:  Married  Other Topics Concern   Not on file  Social History Narrative   Lives at home w/ his wife   Right-handed   Caffeine: 1 cup per day   Social Determinants of Health   Financial Resource Strain: Low Risk  (05/16/2022)   Overall Financial Resource Strain (CARDIA)    Difficulty of Paying Living Expenses: Not hard at all  Food Insecurity: No Food Insecurity (05/16/2022)   Hunger Vital Sign    Worried About Running Out of Food in the Last Year: Never true    Ran Out of Food in the Last Year: Never true  Transportation Needs: No Transportation Needs (05/16/2022)   PRAPARE - Administrator, Civil Service (Medical): No    Lack of Transportation (Non-Medical): No  Physical Activity: Inactive (05/16/2022)   Exercise Vital Sign    Days of Exercise per Week: 0 days    Minutes of Exercise per Session: 0 min  Stress: No Stress Concern Present (05/16/2022)   [employer] of Occupational Health - Occupational Stress Questionnaire    Feeling of Stress : Not at all  Social Connections: Moderately Isolated (05/08/2022)   Social Connection and Isolation Panel [NHANES]    Frequency of Communication with Friends and Family: Three times a week    Frequency of Social Gatherings with Friends and Family: Once a week    Attends Religious Services: Never    Database administrator or Organizations: No    Attends Engineer, structural: Never    Marital Status: Married    Tobacco Counseling Counseling given: Not Answered   Clinical Intake:  Pre-visit preparation completed: Yes  Pain : 0-10 Pain Score: 5  Pain Type: Chronic pain Pain Location: Back Pain Orientation: Lower Pain Descriptors / Indicators: Aching Pain Onset: More than a month ago Pain Frequency: Constant     Nutritional Status: BMI of 19-24  Normal Nutritional Risks: None Diabetes: No  How often do you need to have someone help you when you read instructions, pamphlets, or other written  materials from your doctor or pharmacy?: 1 - Never  Diabetic? no  Interpreter Needed?: No  Information entered by :: NAllen LPN   Activities of Daily Living    05/16/2022    3:12 PM  In your present state of health, do you have any difficulty performing the following activities:  Hearing? 0  Vision? 0  Difficulty concentrating or making decisions? 0  Walking or climbing stairs? 0  Dressing or bathing? 0  Doing errands, shopping? 0  Preparing Food and eating ? N  Using the Toilet? N  In the past six months, have you accidently leaked urine? Y  Do you have problems with loss of bowel control? N  Managing your Medications? N  Managing your Finances? N  Housekeeping or managing your  Housekeeping? N    Patient Care Team: [name], MD as PCP - General (Internal Medicine) Audrie Gallus, RN as [facility] Care Management  Indicate any recent Medical Services you may have received from other than [facility] providers in the past year (date may be approximate).     Assessment:   This is a routine wellness examination for Chad Miller.  Hearing/Vision screen Vision Screening - Comments:: Regular eye exams, Dr. Charlotte Sanes  Dietary issues and exercise activities discussed: Current Exercise Habits: The patient does not participate in regular exercise at present   Goals Addressed             This Visit's Progress    Patient Stated       05/16/2022, wants to get back to regular routine       Depression Screen    05/16/2022    3:12 PM 05/08/2022   10:01 AM 05/02/2021   12:23 PM 04/24/2021    2:30 PM 04/12/2020   10:30 AM 04/01/2019    2:30 PM 07/15/2018    9:27 AM  PHQ 2/9 Scores  PHQ - 2 Score 0 0 0 0 0 0 0  PHQ- 9 Score      3     Fall Risk    05/16/2022    3:11 PM 05/08/2022   10:04 AM 05/02/2021   12:22 PM 04/24/2021    2:31 PM 04/12/2020   10:29 AM  Fall Risk   Falls in the past year? 1 0 1 1 1   Comment slips  lost balance and vertigo  lose  balance  Number falls in past yr: 1  1 1 1   Injury with Fall? 0  0 0 0  Comment    patient said he trips and falls   Risk for fall due to : Medication side effect;Impaired balance/gait  History of fall(s);Impaired balance/gait;Impaired mobility;Medication side effect Impaired balance/gait Impaired mobility;Medication side effect  Follow up Falls evaluation completed;Education provided;Falls prevention discussed  Falls evaluation completed;Education provided;Falls prevention discussed  Falls evaluation completed;Education provided;Falls prevention discussed    FALL RISK PREVENTION PERTAINING TO THE HOME:  Any stairs in or around the home? Yes  If so, are there any without handrails? No  Home free of loose throw rugs in walkways, pet beds, electrical cords, etc? Yes  Adequate lighting in your home to reduce risk of falls? Yes   ASSISTIVE DEVICES UTILIZED TO PREVENT FALLS:  Life alert? No  Use of a cane, walker or w/c? Yes  Grab bars in the bathroom? Yes  Shower chair or bench in shower? Yes  Elevated toilet seat or a handicapped toilet? Yes   TIMED UP AND GO:  Was the test performed? No .       Cognitive Function:        05/16/2022    3:14 PM 05/02/2021   12:26 PM 04/12/2020   10:33 AM 04/01/2019    2:33 PM  6CIT Screen  What Year? 0 points 0 points 0 points 0 points  What month? 0 points 0 points 0 points 0 points  What time? 0 points 0 points 3 points 3 points  Count back from 20 0 points 0 points 0 points 0 points  Months in reverse 0 points 0 points 0 points 0 points  Repeat phrase 0 points 2 points 2 points 2 points  Total Score 0 points 2 points 5 points 5 points    Immunizations Immunization History  Administered Date(s) Administered   04/14/2020  Quad(high Dose 65+) 01/21/2020, 03/29/2021   Influenza, High Dose Seasonal PF 01/28/2017, 02/04/2019   Influenza-Unspecified 01/18/2013, 01/18/2018, 02/04/2019, 02/04/2022   PFIZER Comirnaty(Gray Top)Covid-19  Tri-Sucrose Vaccine 08/18/2020, 02/11/2022   PFIZER(Purple Top)SARS-COV-2 Vaccination 07/02/2019, 07/27/2019, 02/15/2020   PNEUMOCOCCAL CONJUGATE-20 12/13/2021   Pfizer Covid-19 Vaccine Bivalent Booster 71yrs & up 03/27/2021, 09/25/2021   Pneumococcal Polysaccharide-23 04/30/2010, 03/18/2016   RSV,unspecified 03/22/2022   Tdap 09/25/2021   Zoster Recombinat (Shingrix) 05/19/2017, 07/21/2017    TDAP status: Up to date  Flu Vaccine status: Up to date  Pneumococcal vaccine status: Up to date  Covid-19 vaccine status: Completed vaccines  Qualifies for Shingles Vaccine? Yes   Zostavax completed Yes   Shingrix Completed?: Yes  Screening Tests Health Maintenance  Topic Date Due   COVID-19 Vaccine (8 - 2023-24 season) 04/08/2022   Medicare Annual Wellness (AWV)  05/02/2022   DTaP/Tdap/Td (2 - Td or Tdap) 09/26/2031   Pneumonia Vaccine 110+ Years old  Completed   INFLUENZA VACCINE  Completed   Zoster Vaccines- Shingrix  Completed   HPV VACCINES  Aged Out    Health Maintenance  Health Maintenance Due  Topic Date Due   COVID-19 Vaccine (8 - 2023-24 season) 04/08/2022   Medicare Annual Wellness (AWV)  05/02/2022    Colorectal cancer screening: No longer required.   Lung Cancer Screening: (Low Dose CT Chest recommended if Age 60-80 years, 30 pack-year currently smoking OR have quit w/in 15years.) does not qualify.   Lung Cancer Screening Referral: no  Additional Screening:  Hepatitis C Screening: does not qualify;   Vision Screening: Recommended annual ophthalmology exams for early detection of glaucoma and other disorders of the eye. Is the patient up to date with their annual eye exam?  Yes  Who is the provider or what is the name of the office in which the patient attends annual eye exams? Dr. Charlotte Sanes If pt is not established with a provider, would they like to be referred to a provider to establish care? No .   Dental Screening: Recommended annual dental exams for proper  oral hygiene  Community Resource Referral / Chronic Care Management: CRR required this visit?  No   CCM required this visit?  No      Plan:     I have personally reviewed and noted the following in the patient's chart:   Medical and social history Use of alcohol, tobacco or illicit drugs  Current medications and supplements including opioid prescriptions. Patient is not currently taking opioid prescriptions. Functional ability and status Nutritional status Physical activity Advanced directives List of other physicians Hospitalizations, surgeries, and ER visits in previous 12 months Vitals Screenings to include cognitive, depression, and falls Referrals and appointments  In addition, I have reviewed and discussed with patient certain preventive protocols, quality metrics, and best practice recommendations. A written personalized care plan for preventive services as well as general preventive health recommendations were provided to patient.     Barb Merino, LPN   [id number]   Nurse Notes: none  Due to this being a virtual visit, the after visit summary with patients personalized plan was offered to patient via mail or my-chart.  Patient would like to access on my-chart

## 2022-05-16 NOTE — Patient Instructions (Signed)
[name] , Thank you for taking time to come for your Medicare Wellness Visit. I appreciate your ongoing commitment to your health goals. Please review the following plan we discussed and let me know if I can assist you in the future.   These are the goals we discussed:  Goals      CCM (HYPERLIPIDEMIA) EXPECTED OUTCOME: MONITOR, SELF-MANAGE AND REDUCE SYMPTOMS OF HYPERLIPIDEMIA     Current Barriers:  Knowledge Deficits related to Hyperlipidemia management Chronic Disease Management support and education needs related to Hyperlipidemia and diet Patient reports he has all medications and taking as prescribed, does stretching at least 5 days per week  Planned Interventions: Provider established cholesterol goals reviewed; Counseled on importance of regular laboratory monitoring as prescribed; Provided HLD educational materials; Reviewed role and benefits of statin for ASCVD risk reduction; Reviewed importance of limiting foods high in cholesterol; Reviewed exercise goals and target of 150 minutes per week; Screening for signs and symptoms of depression related to chronic disease state;  Assessed social determinant of health barriers;  Reviewed upcoming scheduled appointments- 05/17/22 pharmD to call at 1030 am, 06/17/22 primary care provider visit at 1140 am  Symptom Management: Take medications as prescribed   Attend all scheduled provider appointments Call pharmacy for medication refills 3-7 days in advance of running out of medications Perform all self care activities independently  Perform IADL's (shopping, preparing meals, housekeeping, managing finances) independently Call provider office for new concerns or questions  - call for medicine refill 2 or 3 days before it runs out - take all medications exactly as prescribed - call doctor with any symptoms you believe are related to your medicine - call doctor when you experience any new symptoms - go to all doctor appointments as  scheduled - adhere to prescribed diet: heart healthy, look over education sent via my chart- heart healthy diet - develop an exercise routine Look over education sent via my chart- heart healthy diet Pharmacist to call 05/17/22 at 1030 am Visit scheduled with primary care provider 06/17/22 at 1140 am  Follow Up Plan: Telephone follow up appointment with care management team member scheduled for:  07/11/22 at 1045 am       CCM (HYPERTENSION) EXPECTED OUTCOME: MONITOR, SELF-MANAGE AND REDUCE SYMPTOMS OF HYPERTENSION     Current Barriers:  Knowledge Deficits related to Hypertension management Chronic Disease Management support and education needs related to Hypertension, diet Patient reports he lives with spouse and adult son, is overall independent with all aspects of his care, continues to drive, checks blood pressure every other day and states " readings are usually good"  Planned Interventions: Evaluation of current treatment plan related to hypertension self management and patient's adherence to plan as established by provider;   Reviewed prescribed diet low sodium Reviewed medications with patient and discussed importance of compliance;  Counseled on the importance of exercise goals with target of 150 minutes per week Discussed plans with patient for ongoing care management follow up and provided patient with direct contact information for care management team; Advised patient, providing education and rationale, to monitor blood pressure daily and record, calling PCP for findings outside established parameters;  Advised patient to discuss any issues with blood pressure with provider; Provided education on prescribed diet low sodium;  Discussed complications of poorly controlled blood pressure such as heart disease, stroke, circulatory complications, vision complications, kidney impairment, sexual dysfunction;  Screening for signs and symptoms of depression related to chronic disease state;   Assessed social determinant  of health barriers;   Symptom Management: Take medications as prescribed   Attend all scheduled provider appointments Call pharmacy for medication refills 3-7 days in advance of running out of medications Perform all self care activities independently  Perform IADL's (shopping, preparing meals, housekeeping, managing finances) independently Call provider office for new concerns or questions  check blood pressure daily choose a place to take my blood pressure (home, clinic or office, retail store) write blood pressure results in a log or diary learn about high blood pressure keep a blood pressure log take blood pressure log to all doctor appointments call doctor for signs and symptoms of high blood pressure keep all doctor appointments take medications for blood pressure exactly as prescribed begin an exercise program report new symptoms to your doctor eat more whole grains, fruits and vegetables, lean meats and healthy fats Look over education sent via my chart- low sodium diet  Follow Up Plan: Telephone follow up appointment with care management team member scheduled for:  07/11/22 at 1045 am       CCM Expected Outcome:  Monitor, Self-Manage and Reduce Symptoms of:     Patient Stated     04/01/2019, wants to do more exercise with the equipment at home     Patient Stated     04/12/2020, keep living     Patient Stated     05/02/2021, continue to go to doctor to mange problems     Patient Stated     05/16/2022, wants to get back to regular routine        This is a list of the screening recommended for you and due dates:  Health Maintenance  Topic Date Due   COVID-19 Vaccine (8 - 2023-24 season) 04/08/2022   Medicare Annual Wellness Visit  05/17/2023   DTaP/Tdap/Td vaccine (2 - Td or Tdap) 09/26/2031   Pneumonia Vaccine  Completed   Flu Shot  Completed   Zoster (Shingles) Vaccine  Completed   HPV Vaccine  Aged Out    Advanced directives:  Please bring a copy of your POA (Power of [place]) and/or Living Will to your next appointment.   Conditions/risks identified: none  Next appointment: Follow up in one year for your annual wellness visit.   Preventive Care 48 Years and Older, Male  Preventive care refers to lifestyle choices and visits with your health care provider that can promote health and wellness. What does preventive care include? A yearly physical exam. This is also called an annual well check. Dental exams once or twice a year. Routine eye exams. Ask your health care provider how often you should have your eyes checked. Personal lifestyle choices, including: Daily care of your teeth and gums. Regular physical activity. Eating a healthy diet. Avoiding tobacco and drug use. Limiting alcohol use. Practicing safe sex. Taking low doses of aspirin every day. Taking vitamin and mineral supplements as recommended by your health care provider. What happens during an annual well check? The services and screenings done by your health care provider during your annual well check will depend on your age, overall health, lifestyle risk factors, and family history of disease. Counseling  Your health care provider may ask you questions about your: Alcohol use. Tobacco use. Drug use. Emotional well-being. Home and relationship well-being. Sexual activity. Eating habits. History of falls. Memory and ability to understand (cognition). Work and work Astronomer. Screening  You may have the following tests or measurements: Height, weight, and BMI. Blood pressure. Lipid and cholesterol levels. These  may be checked every 5 years, or more frequently if you are over 39 years old. Skin check. Lung cancer screening. You may have this screening every year starting at age 4 if you have a 30-pack-year history of smoking and currently smoke or have quit within the past 15 years. Fecal occult blood test (FOBT) of the stool. You  may have this test every year starting at age 32. Flexible sigmoidoscopy or colonoscopy. You may have a sigmoidoscopy every 5 years or a colonoscopy every 10 years starting at age 51. Prostate cancer screening. Recommendations will vary depending on your family history and other risks. Hepatitis C blood test. Hepatitis B blood test. Sexually transmitted disease (STD) testing. Diabetes screening. This is done by checking your blood sugar (glucose) after you have not eaten for a while (fasting). You may have this done every 1-3 years. Abdominal aortic aneurysm (AAA) screening. You may need this if you are a current or former smoker. Osteoporosis. You may be screened starting at age 40 if you are at high risk. Talk with your health care provider about your test results, treatment options, and if necessary, the need for more tests. Vaccines  Your health care provider may recommend certain vaccines, such as: Influenza vaccine. This is recommended every year. Tetanus, diphtheria, and acellular pertussis (Tdap, Td) vaccine. You may need a Td booster every 10 years. Zoster vaccine. You may need this after age 24. Pneumococcal 13-valent conjugate (PCV13) vaccine. One dose is recommended after age 73. Pneumococcal polysaccharide (PPSV23) vaccine. One dose is recommended after age 57. Talk to your health care provider about which screenings and vaccines you need and how often you need them. This information is not intended to replace advice given to you by your health care provider. Make sure you discuss any questions you have with your health care provider. Document Released: 06/02/2015 Document Revised: 01/24/2016 Document Reviewed: 03/07/2015 Elsevier Interactive Patient Education  2017 [employer].  Fall Prevention in the Home Falls can cause injuries. They can happen to people of all ages. There are many things you can do to make your home safe and to help prevent falls. What can I do on the  outside of my home? Regularly fix the edges of walkways and driveways and fix any cracks. Remove anything that might make you trip as you walk through a door, such as a raised step or threshold. Trim any bushes or trees on the path to your home. Use bright outdoor lighting. Clear any walking paths of anything that might make someone trip, such as rocks or tools. Regularly check to see if handrails are loose or broken. Make sure that both sides of any steps have handrails. Any raised decks and porches should have guardrails on the edges. Have any leaves, snow, or ice cleared regularly. Use sand or salt on walking paths during winter. Clean up any spills in your garage right away. This includes oil or grease spills. What can I do in the bathroom? Use night lights. Install grab bars by the toilet and in the tub and shower. Do not use towel bars as grab bars. Use non-skid mats or decals in the tub or shower. If you need to sit down in the shower, use a plastic, non-slip stool. Keep the floor dry. Clean up any water that spills on the floor as soon as it happens. Remove soap buildup in the tub or shower regularly. Attach bath mats securely with double-sided non-slip rug tape. Do not have throw rugs  and other things on the floor that can make you trip. What can I do in the bedroom? Use night lights. Make sure that you have a light by your bed that is easy to reach. Do not use any sheets or blankets that are too big for your bed. They should not hang down onto the floor. Have a firm chair that has side arms. You can use this for support while you get dressed. Do not have throw rugs and other things on the floor that can make you trip. What can I do in the kitchen? Clean up any spills right away. Avoid walking on wet floors. Keep items that you use a lot in easy-to-reach places. If you need to reach something above you, use a strong step stool that has a grab bar. Keep electrical cords out of  the way. Do not use floor polish or wax that makes floors slippery. If you must use wax, use non-skid floor wax. Do not have throw rugs and other things on the floor that can make you trip. What can I do with my stairs? Do not leave any items on the stairs. Make sure that there are handrails on both sides of the stairs and use them. Fix handrails that are broken or loose. Make sure that handrails are as long as the stairways. Check any carpeting to make sure that it is firmly attached to the stairs. Fix any carpet that is loose or worn. Avoid having throw rugs at the top or bottom of the stairs. If you do have throw rugs, attach them to the floor with carpet tape. Make sure that you have a light switch at the top of the stairs and the bottom of the stairs. If you do not have them, ask someone to add them for you. What else can I do to help prevent falls? Wear shoes that: Do not have high heels. Have rubber bottoms. Are comfortable and fit you well. Are closed at the toe. Do not wear sandals. If you use a stepladder: Make sure that it is fully opened. Do not climb a closed stepladder. Make sure that both sides of the stepladder are locked into place. Ask someone to hold it for you, if possible. Clearly mark and make sure that you can see: Any grab bars or handrails. First and last steps. Where the edge of each step is. Use tools that help you move around (mobility aids) if they are needed. These include: Canes. Walkers. Scooters. Crutches. Turn on the lights when you go into a dark area. Replace any light bulbs as soon as they burn out. Set up your furniture so you have a clear path. Avoid moving your furniture around. If any of your floors are uneven, fix them. If there are any pets around you, be aware of where they are. Review your medicines with your doctor. Some medicines can make you feel dizzy. This can increase your chance of falling. Ask your doctor what other things that you  can do to help prevent falls. This information is not intended to replace advice given to you by your health care provider. Make sure you discuss any questions you have with your health care provider. Document Released: 03/02/2009 Document Revised: 10/12/2015 Document Reviewed: 06/10/2014 Elsevier Interactive Patient Education  2017 [employer]

## 2022-05-17 ENCOUNTER — Telehealth: Payer: Self-pay

## 2022-05-31 ENCOUNTER — Telehealth: Payer: Self-pay

## 2022-05-31 NOTE — Progress Notes (Signed)
Care Management & Coordination Services Pharmacy Team  Reason for Encounter: Appointment Reminder  Contacted patient on 05/31/2022   Recent office visits:  05-16-2022 Kellie Simmering, LPN. Medicare annual wellness visit.  05-08-2022  Kassie Mends, RN (CCM).  05-03-2022 Little, Claudette Stapler, RN (CCM).  04-10-2022 Little, Claudette Stapler, RN (CCM).  04-08-2022 Little, Claudette Stapler, RN (CCM).  02-06-2022 Little, Claudette Stapler, RN (CCM)  01-16-2022 Rex Kras Claudette Stapler, RN (CCM)  12-13-2021 [name], MD. WBC= 2.8. Reported not taking CO Q10, claritin, melatonin, nasonex, multivitamin and omega 3.  Recent consult visits:  03-15-2022 Adrian Prows, MD (Cardiology). Follow up visit.  01-14-2022 Adrian Prows, MD (Cardiology). EKG completed. START bystolic 10 mg daily. STOP azelastine, metoprolol, naproxen and peppermint oil.  12-17-2021 Christella Noa MD Marylyn Ishihara (Neurosurgery). Initial visit for lower back pain.  Hospital visits:  None in previous 6 months  Medications: Outpatient Encounter Medications as of 05/31/2022  Medication Sig   acetaminophen (TYLENOL) 325 MG tablet Take 650 mg by mouth every 6 (six) hours as needed.   amLODipine (NORVASC) 5 MG tablet Take 1 tablet (5 mg total) by mouth every evening.   atorvastatin (LIPITOR) 10 MG tablet Take 1 tablet (10 mg total) by mouth daily.   nebivolol (BYSTOLIC) 10 MG tablet Take 1 tablet (10 mg total) by mouth every morning.   Nutritional Supplements (EQUATE) LIQD Take by mouth daily.   olmesartan (BENICAR) 20 MG tablet TAKE 1 TABLET EVERY MORNING   No facility-administered encounter medications on file as of 05/31/2022.   Lab Results  Component Value Date/Time   HGBA1C 5.4 04/01/2019 03:53 PM   HGBA1C 5.7 (H) 07/15/2018 10:29 AM   MICROALBUR 30 04/12/2020 02:39 PM   MICROALBUR 10 04/01/2019 04:51 PM    BP Readings from Last 3 Encounters:  03/15/22 128/78  01/14/22 (!) 151/84  12/13/21 136/78      Patient contacted to confirm telephone appointment  with Orlando Penner PharmD, on  06-04-2022 at 3:00.  Do you have any problems getting your medications? No  What is your top health concern you would like to discuss at your upcoming visit? Patient stated he just wants to clarify if his medication regimen is working. Patient reports he is concerned about having vertigo.  Have you seen any other providers since your last visit with PCP? No  Star Rating Drugs:  Atorvastatin 10 mg- Last filled 03-15-2022 [facility]. Previous 04-24-2021 [facility] Olmesartan 20 mg- Last filled 03-19-2022 [facility]. Previous 11-02-2021 [facility]   Care Gaps: Annual wellness visit in last year? Yes  If Diabetic: Not diabetic

## 2022-06-04 NOTE — Progress Notes (Unsigned)
Care Management & Coordination Services Pharmacy Note  06/04/2022 Name:  Chad Miller MRN:  [id number] DOB:  1940-07-23  Summary: ***  Recommendations/Changes made from today's visit: ***  Follow up plan: ***   Subjective: Chad Miller is an 82 y.o. year old male who is a primary patient of [name], MD.  The care coordination team was consulted for assistance with disease management and care coordination needs.    Engaged with patient by telephone for follow up visit.  Recent office visits: ***  Recent consult visits: ***  Hospital visits: None in previous 6 months   Objective:  Lab Results  Component Value Date   CREATININE 0.89 12/13/2021   BUN 11 12/13/2021   EGFR 86 12/13/2021   GFRNONAA 76 04/12/2020   GFRAA 88 04/12/2020   NA 139 12/13/2021   K 3.9 12/13/2021   CALCIUM 9.2 12/13/2021   CO2 20 12/13/2021   GLUCOSE 89 12/13/2021    Lab Results  Component Value Date/Time   HGBA1C 5.4 04/01/2019 03:53 PM   HGBA1C 5.7 (H) 07/15/2018 10:29 AM   MICROALBUR 30 04/12/2020 02:39 PM   MICROALBUR 10 04/01/2019 04:51 PM    Last diabetic Eye exam: No results found for: "HMDIABEYEEXA"  Last diabetic Foot exam: No results found for: "HMDIABFOOTEX"   Lab Results  Component Value Date   CHOL 137 12/13/2021   HDL 51 12/13/2021   LDLCALC 76 12/13/2021   TRIG 43 12/13/2021   CHOLHDL 2.7 12/13/2021       Latest Ref Rng & Units 12/13/2021   12:45 PM 04/24/2021    3:10 PM 10/10/2020   11:52 AM  Hepatic Function  Total Protein 6.0 - 8.5 g/dL 7.0  7.6  8.3   Albumin 3.7 - 4.7 g/dL 3.9  4.4  4.4   AST 0 - 40 IU/L 23  24  26    ALT 0 - 44 IU/L 23  23  27    Alk Phosphatase 44 - 121 IU/L 75  78  82   Total Bilirubin 0.0 - 1.2 mg/dL 1.2  1.2  0.8   Bilirubin, Direct 0.00 - 0.40 mg/dL   0.15     Lab Results  Component Value Date/Time   TSH 1.780 12/13/2021 12:45 PM   TSH 1.540 04/12/2020 11:56 AM       Latest Ref Rng & Units 12/13/2021   12:45 PM  04/24/2021    3:10 PM 04/12/2020   11:56 AM  CBC  WBC 3.4 - 10.8 x10E3/uL 2.8  3.9  3.2   Hemoglobin 13.0 - 17.7 g/dL 14.2  14.8  16.3   Hematocrit 37.5 - 51.0 % 42.1  42.5  47.6   Platelets 150 - 450 x10E3/uL 181  222  216     Lab Results  Component Value Date/Time   VITAMINB12 1,170 12/13/2021 12:45 PM   VITAMINB12 1,411 (H) 04/12/2020 11:56 AM    Clinical ASCVD: {YES/NO:21197} The ASCVD Risk score (Arnett DK, et al., 2019) failed to calculate for the following reasons:   The 2019 ASCVD risk score is only valid for ages 40 to 63    ***Other: (CHADS2VASc if Afib, MMRC or CAT for COPD, ACT, DEXA)     05/16/2022    3:12 PM 05/08/2022   10:01 AM 05/02/2021   12:23 PM  Depression screen PHQ 2/9  Decreased Interest 0 0 0  Down, Depressed, Hopeless 0 0 0  PHQ - 2 Score 0 0 0     Social History  Tobacco Use  Smoking Status Never  Smokeless Tobacco Never   BP Readings from Last 3 Encounters:  03/15/22 128/78  01/14/22 (!) 151/84  12/13/21 136/78   Pulse Readings from Last 3 Encounters:  03/15/22 (!) 58  01/14/22 (!) 58  12/13/21 (!) 58   Wt Readings from Last 3 Encounters:  05/16/22 160 lb (72.6 kg)  03/15/22 149 lb 6.4 oz (67.8 kg)  01/14/22 150 lb (68 kg)   BMI Readings from Last 3 Encounters:  05/16/22 22.96 kg/m  03/15/22 21.44 kg/m  01/14/22 21.52 kg/m    No Known Allergies  Medications Reviewed Today     Reviewed by Barb Merino, LPN (Licensed Practical Nurse) on 05/16/22 at 1508  Med List Status: <None>   Medication Order Taking? Sig Documenting Provider Last Dose Status Informant  acetaminophen (TYLENOL) 325 MG tablet [id number] Yes Take 650 mg by mouth every 6 (six) hours as needed. [provider] Taking Active   amLODipine (NORVASC) 5 MG tablet [id number] Yes Take 1 tablet (5 mg total) by mouth every evening. Yates Decamp, MD Taking Active   atorvastatin (LIPITOR) 10 MG tablet [id number] Yes Take 1 tablet (10 mg total) by mouth  daily. [name], MD Taking Active   nebivolol (BYSTOLIC) 10 MG tablet [id number] Yes Take 1 tablet (10 mg total) by mouth every morning. Yates Decamp, MD Taking Active   Nutritional Supplements (EQUATE) Anise Salvo [id number] Yes Take by mouth daily. [provider] Taking Active   olmesartan (BENICAR) 20 MG tablet [id number] Yes TAKE 1 TABLET EVERY MORNING [name], MD Taking Active             SDOH:  (Social Determinants of Health) assessments and interventions performed: {yes/no:20286} SDOH Interventions    Flowsheet Row Clinical Support from 05/16/2022 in [facility] Chronic Care Management from 05/08/2022 in [facility] Chronic Care Management from 11/02/2020 in [facility] Clinical Support from 04/01/2019 in [facility]  SDOH Interventions      Food Insecurity Interventions Intervention Not Indicated Intervention Not Indicated Intervention Not Indicated --  Housing Interventions -- Intervention Not Indicated Intervention Not Indicated --  Transportation Interventions Intervention Not Indicated Intervention Not Indicated Intervention Not Indicated --  Utilities Interventions -- Intervention Not Indicated -- --  Depression Interventions/Treatment  -- -- -- PHQ2-9 Score <4 Follow-up Not Indicated  Financial Strain Interventions Intervention Not Indicated Intervention Not Indicated -- --  Physical Activity Interventions Patient Refused, Other (Comments) Patient Refused -- --  Stress Interventions Intervention Not Indicated Intervention Not Indicated -- --  Social Connections Interventions -- Patient Refused -- --      SDOH Screenings   Food Insecurity: No Food Insecurity (05/16/2022)  Housing: Low Risk  (05/08/2022)  Transportation Needs: No Transportation Needs (05/16/2022)  Utilities: Not At Risk (05/08/2022)  Depression (PHQ2-9): Low Risk  (05/16/2022)  Financial Resource  Strain: Low Risk  (05/16/2022)  Physical Activity: Inactive (05/16/2022)  Social Connections: Moderately Isolated (05/08/2022)  Stress: No Stress Concern Present (05/16/2022)  Tobacco Use: Low Risk  (05/16/2022)    Medication Assistance: {MEDASSISTANCEINFO:25044}  Medication Access: Within the past 30 days, how often has patient missed a dose of medication? *** Is a pillbox or other method used to improve adherence? {YES/NO:21197} Factors that may affect medication adherence? {CHL DESC; BARRIERS:21522} Are meds synced by current pharmacy? {YES/NO:21197} Are meds delivered by current pharmacy? {YES/NO:21197} Does patient experience delays in picking up medications due to transportation concerns? {YES/NO:21197}  [facility]  Reviewed: Is patient disadvantaged to use [facility]?: {YES/NO:21197} Current Rx insurance plan: *** Name and location of Current pharmacy:  [facility] [id number] - [address], [address] [place] [address] Phone: [phone] Fax: [phone]  [facility] [place] [address] [facility] [address] [place] [address] Phone: [phone] Fax: [phone]  [facility] services reviewed with patient today?: {YES/NO:21197} Patient requests to transfer care to [facility]?: {YES/NO:21197} Reason patient declined to change pharmacies: {US patient preference:27474}  Compliance/Adherence/Medication fill history: Care Gaps: ***  Star-Rating Drugs: ***   Assessment/Plan   {CCM PHARMD DISEASE STATES:25130}  ***

## 2022-06-08 ENCOUNTER — Other Ambulatory Visit: Payer: Self-pay | Admitting: Internal Medicine

## 2022-06-08 DIAGNOSIS — E7800 Pure hypercholesterolemia, unspecified: Secondary | ICD-10-CM

## 2022-06-08 DIAGNOSIS — I129 Hypertensive chronic kidney disease with stage 1 through stage 4 chronic kidney disease, or unspecified chronic kidney disease: Secondary | ICD-10-CM

## 2022-06-08 DIAGNOSIS — N182 Chronic kidney disease, stage 2 (mild): Secondary | ICD-10-CM

## 2022-06-08 DIAGNOSIS — M48062 Spinal stenosis, lumbar region with neurogenic claudication: Secondary | ICD-10-CM

## 2022-06-08 DIAGNOSIS — I10 Essential (primary) hypertension: Secondary | ICD-10-CM

## 2022-06-13 DIAGNOSIS — N5201 Erectile dysfunction due to arterial insufficiency: Secondary | ICD-10-CM | POA: Diagnosis not present

## 2022-06-13 DIAGNOSIS — N401 Enlarged prostate with lower urinary tract symptoms: Secondary | ICD-10-CM | POA: Diagnosis not present

## 2022-06-13 DIAGNOSIS — R350 Frequency of micturition: Secondary | ICD-10-CM | POA: Diagnosis not present

## 2022-06-14 ENCOUNTER — Telehealth: Payer: Self-pay

## 2022-06-14 DIAGNOSIS — H25812 Combined forms of age-related cataract, left eye: Secondary | ICD-10-CM | POA: Diagnosis not present

## 2022-06-14 DIAGNOSIS — H40012 Open angle with borderline findings, low risk, left eye: Secondary | ICD-10-CM | POA: Diagnosis not present

## 2022-06-17 ENCOUNTER — Encounter: Payer: Self-pay | Admitting: Internal Medicine

## 2022-06-17 ENCOUNTER — Ambulatory Visit (INDEPENDENT_AMBULATORY_CARE_PROVIDER_SITE_OTHER): Payer: Medicare Other | Admitting: Internal Medicine

## 2022-06-17 VITALS — BP 110/78 | HR 55 | Temp 98.1°F | Ht 70.0 in | Wt 148.4 lb

## 2022-06-17 DIAGNOSIS — I493 Ventricular premature depolarization: Secondary | ICD-10-CM | POA: Diagnosis not present

## 2022-06-17 DIAGNOSIS — E7800 Pure hypercholesterolemia, unspecified: Secondary | ICD-10-CM | POA: Diagnosis not present

## 2022-06-17 DIAGNOSIS — N182 Chronic kidney disease, stage 2 (mild): Secondary | ICD-10-CM | POA: Diagnosis not present

## 2022-06-17 DIAGNOSIS — Z6821 Body mass index (BMI) 21.0-21.9, adult: Secondary | ICD-10-CM | POA: Diagnosis not present

## 2022-06-17 DIAGNOSIS — M48062 Spinal stenosis, lumbar region with neurogenic claudication: Secondary | ICD-10-CM

## 2022-06-17 DIAGNOSIS — I739 Peripheral vascular disease, unspecified: Secondary | ICD-10-CM

## 2022-06-17 DIAGNOSIS — I129 Hypertensive chronic kidney disease with stage 1 through stage 4 chronic kidney disease, or unspecified chronic kidney disease: Secondary | ICD-10-CM | POA: Diagnosis not present

## 2022-06-17 MED ORDER — AMLODIPINE BESYLATE 5 MG PO TABS: 5.0000 mg | ORAL_TABLET | Freq: Every evening | ORAL | 3 refills | Status: DC

## 2022-06-17 MED ORDER — NEBIVOLOL HCL 10 MG PO TABS: 10.0000 mg | ORAL_TABLET | ORAL | 2 refills | Status: DC

## 2022-06-17 MED ORDER — ATORVASTATIN CALCIUM 10 MG PO TABS: 10.0000 mg | ORAL_TABLET | Freq: Every day | ORAL | 2 refills | Status: DC

## 2022-06-17 MED ORDER — OLMESARTAN MEDOXOMIL 20 MG PO TABS: 20.0000 mg | ORAL_TABLET | Freq: Every morning | ORAL | 2 refills | Status: DC

## 2022-06-17 NOTE — Progress Notes (Signed)
I,Chad Miller,acting as a scribe for Chad Greenland, MD.,have documented all relevant documentation on the behalf of Chad Greenland, MD,as directed by  Chad Greenland, MD while in the presence of Chad Greenland, MD.    Subjective:     Patient ID: Chad Miller , male    DOB: Jul 17, 1940 , 82 y.o.   MRN: [id number]   Chief Complaint  Patient presents with   Hypertension    HPI  He presents today for BP check. He reports compliance with meds. He denies headaches, chest pain and visual disturbances. He is also followed by cardiology, Dr. Einar Gip.   He has been busy caring for his wife who has had a pressure ulcer. She is currently in a rehab facility, [name].  Hypertension This is a chronic problem. The current episode started more than 1 year ago. The problem has been gradually improving since onset. The problem is controlled. Pertinent negatives include no blurred vision. Risk factors for coronary artery disease include dyslipidemia and male gender. Past treatments include angiotensin blockers. Hypertensive end-organ damage includes kidney disease.     Past Medical History:  Diagnosis Date   High cholesterol      Family History  Problem Relation Age of Onset   Heart disease Mother    Lung cancer Father    Asthma Sister    Heart disease Brother    Heart disease Brother    Allergic rhinitis Neg Hx    Eczema Neg Hx    Urticaria Neg Hx      Current Outpatient Medications:    acetaminophen (TYLENOL) 325 MG tablet, Take 650 mg by mouth every 6 (six) hours as needed., Disp: , Rfl:    Nutritional Supplements (EQUATE) LIQD, Take by mouth daily., Disp: , Rfl:    amLODipine (NORVASC) 5 MG tablet, Take 1 tablet (5 mg total) by mouth every evening., Disp: 90 tablet, Rfl: 3   atorvastatin (LIPITOR) 10 MG tablet, Take 1 tablet (10 mg total) by mouth daily., Disp: 90 tablet, Rfl: 2   nebivolol (BYSTOLIC) 10 MG tablet, Take 1 tablet (10 mg total) by mouth every morning.,  Disp: 90 tablet, Rfl: 2   olmesartan (BENICAR) 20 MG tablet, Take 1 tablet (20 mg total) by mouth every morning., Disp: 90 tablet, Rfl: 2   No Known Allergies   Review of Systems  Constitutional: Negative.   Eyes:  Negative for blurred vision.  Respiratory: Negative.    Cardiovascular: Negative.   Gastrointestinal: Negative.   Genitourinary: Negative.   Musculoskeletal: Negative.   Skin: Negative.   Allergic/Immunologic: Negative.   Neurological: Negative.   Hematological: Negative.   Psychiatric/Behavioral: Negative.       Today's Vitals   06/17/22 1116  BP: 110/78  Pulse: (!) 55  Temp: 98.1 F (36.7 C)  SpO2: 94%  Weight: 148 lb 6.4 oz (67.3 kg)  Height: 5\' 10"  (1.778 m)   Body mass index is 21.29 kg/m.  Wt Readings from Last 3 Encounters:  06/17/22 148 lb 6.4 oz (67.3 kg)  05/16/22 160 lb (72.6 kg)  03/15/22 149 lb 6.4 oz (67.8 kg)    Objective:  Physical Exam Vitals and nursing note reviewed.  Constitutional:      Appearance: Normal appearance.  HENT:     Head: Normocephalic and atraumatic.     Nose:     Comments: Masked     Mouth/Throat:     Comments: Masked  Eyes:     Extraocular Movements: Extraocular movements  intact.  Cardiovascular:     Rate and Rhythm: Normal rate and regular rhythm.     Heart sounds: Normal heart sounds.  Pulmonary:     Effort: Pulmonary effort is normal.     Breath sounds: Normal breath sounds.  Musculoskeletal:     Cervical back: Normal range of motion.     Comments: Ambulatory with walker   Skin:    General: Skin is warm.  Neurological:     General: No focal deficit present.     Mental Status: He is alert.  Psychiatric:        Mood and Affect: Mood normal.       Assessment And Plan:     1. Hypertensive nephropathy Comments: Chronic, well controlled.  He will c/w amlodipine 5mg , nebivolol and olmesartan. He will f/lu in six months for re-evaluation. - CMP14+EGFR - Lipid panel - CBC - olmesartan (BENICAR) 20 MG  tablet; Take 1 tablet (20 mg total) by mouth every morning.  Dispense: 90 tablet; Refill: 2 - amLODipine (NORVASC) 5 MG tablet; Take 1 tablet (5 mg total) by mouth every evening.  Dispense: 90 tablet; Refill: 3 - nebivolol (BYSTOLIC) 10 MG tablet; Take 1 tablet (10 mg total) by mouth every morning.  Dispense: 90 tablet; Refill: 2  2. Chronic renal disease, stage II Comments: Chronic, he is encouraged to avoid NSAIDs, stay well hydrated and keep BP well controlled to decrease risk of CKD progression. - CMP14+EGFR - CBC  3. Pure hypercholesterolemia Comments: Chronic, currently on atorvastatin. Due to underlying PAD, should aim for LDL<70. - Lipid panel - TSH  4. Spinal stenosis of lumbar region with neurogenic claudication Comments: Chronic, now ambulatory with a walker. He was advised by Neurosurgery to NOT pursue surgical intervention.  5. Peripheral artery disease ([place]) Comments: Chronic, importance of regular exercise and statin compliance was d/w patient.  6. PVC (premature ventricular contraction) Comments: Chronic, he is having less palpitations while on B-blocker therapy.  Most recent Cardiology notes reviewed in full detail.  7. Body mass index (BMI) 21.0-21.9, adult Comments: It appears he has lost 12 lbs since Dec 2023; however, this was a video visit and he did not weigh himself. Weight is similar to Oct 2023. - Prealbumin   Patient was given opportunity to ask questions. Patient verbalized understanding of the plan and was able to repeat key elements of the plan. All questions were answered to their satisfaction.   I, Chad Greenland, MD, have reviewed all documentation for this visit. The documentation on 06/17/22 for the exam, diagnosis, procedures, and orders are all accurate and complete.   IF YOU HAVE BEEN REFERRED TO A SPECIALIST, IT MAY TAKE 1-2 WEEKS TO SCHEDULE/PROCESS THE REFERRAL. IF YOU HAVE NOT HEARD FROM US/SPECIALIST IN TWO WEEKS, PLEASE GIVE [place] A CALL AT  [phone].   THE PATIENT IS ENCOURAGED TO PRACTICE SOCIAL DISTANCING DUE TO THE COVID-19 PANDEMIC.

## 2022-06-17 NOTE — Patient Instructions (Signed)
Hypertension, Adult Hypertension is another name for high blood pressure. High blood pressure forces your heart to work harder to pump blood. This can cause problems over time. There are two numbers in a blood pressure reading. There is a top number (systolic) over a bottom number (diastolic). It is best to have a blood pressure that is below 120/80. What are the causes? The cause of this condition is not known. Some other conditions can lead to high blood pressure. What increases the risk? Some lifestyle factors can make you more likely to develop high blood pressure: Smoking. Not getting enough exercise or physical activity. Being overweight. Having too much fat, sugar, calories, or salt (sodium) in your diet. Drinking too much alcohol. Other risk factors include: Having any of these conditions: Heart disease. Diabetes. High cholesterol. Kidney disease. Obstructive sleep apnea. Having a family history of high blood pressure and high cholesterol. Age. The risk increases with age. Stress. What are the signs or symptoms? High blood pressure may not cause symptoms. Very high blood pressure (hypertensive crisis) may cause: Headache. Fast or uneven heartbeats (palpitations). Shortness of breath. Nosebleed. Vomiting or feeling like you may vomit (nauseous). Changes in how you see. Very bad chest pain. Feeling dizzy. Seizures. How is this treated? This condition is treated by making healthy lifestyle changes, such as: Eating healthy foods. Exercising more. Drinking less alcohol. Your doctor may prescribe medicine if lifestyle changes do not help enough and if: Your top number is above 130. Your bottom number is above 80. Your personal target blood pressure may vary. Follow these instructions at home: Eating and drinking  If told, follow the DASH eating plan. To follow this plan: Fill one half of your plate at each meal with fruits and vegetables. Fill one fourth of your plate  at each meal with whole grains. Whole grains include whole-wheat pasta, brown rice, and whole-grain bread. Eat or drink low-fat dairy products, such as skim milk or low-fat yogurt. Fill one fourth of your plate at each meal with low-fat (lean) proteins. Low-fat proteins include fish, chicken without skin, eggs, beans, and tofu. Avoid fatty meat, cured and processed meat, or chicken with skin. Avoid pre-made or processed food. Limit the amount of salt in your diet to less than 1,500 mg each day. Do not drink alcohol if: Your doctor tells you not to drink. You are pregnant, may be pregnant, or are planning to become pregnant. If you drink alcohol: Limit how much you have to: 0-1 drink a day for women. 0-2 drinks a day for men. Know how much alcohol is in your drink. In the [place] one drink equals one 12 oz bottle of beer (355 mL), one 5 oz glass of wine (148 mL), or one 1 oz glass of hard liquor (44 mL). Lifestyle  Work with your doctor to stay at a healthy weight or to lose weight. Ask your doctor what the best weight is for you. Get at least 30 minutes of exercise that causes your heart to beat faster (aerobic exercise) most days of the week. This may include walking, swimming, or biking. Get at least 30 minutes of exercise that strengthens your muscles (resistance exercise) at least 3 days a week. This may include lifting weights or doing Pilates. Do not smoke or use any products that contain nicotine or tobacco. If you need help quitting, ask your doctor. Check your blood pressure at home as told by your doctor. Keep all follow-up visits. Medicines Take over-the-counter and prescription medicines  only as told by your doctor. Follow directions carefully. ?Do not skip doses of blood pressure medicine. The medicine does not work as well if you skip doses. Skipping doses also puts you at risk for problems. ?Ask your doctor about side effects or reactions to medicines that you should watch  for. ?Contact a doctor if: ?You think you are having a reaction to the medicine you are taking. ?You have headaches that keep coming back. ?You feel dizzy. ?You have swelling in your ankles. ?You have trouble with your vision. ?Get help right away if: ?You get a very bad headache. ?You start to feel mixed up (confused). ?You feel weak or numb. ?You feel faint. ?You have very bad pain in your: ?Chest. ?Belly (abdomen). ?You vomit more than once. ?You have trouble breathing. ?These symptoms may be an emergency. Get help right away. Call 911. ?Do not wait to see if the symptoms will go away. ?Do not drive yourself to the hospital. ?Summary ?Hypertension is another name for high blood pressure. ?High blood pressure forces your heart to work harder to pump blood. ?For most people, a normal blood pressure is less than 120/80. ?Making healthy choices can help lower blood pressure. If your blood pressure does not get lower with healthy choices, you may need to take medicine. ?This information is not intended to replace advice given to you by your health care provider. Make sure you discuss any questions you have with your health care provider. ?Document Revised: 02/22/2021 Document Reviewed: 02/22/2021 ?Elsevier Patient Education ? 2023 Elsevier Inc. ? ?

## 2022-06-18 LAB — CMP14+EGFR
ALT: 24 IU/L (ref 0–44)
AST: 25 IU/L (ref 0–40)
Albumin/Globulin Ratio: 1.3 (ref 1.2–2.2)
Albumin: 3.9 g/dL (ref 3.7–4.7)
Alkaline Phosphatase: 73 IU/L (ref 44–121)
BUN/Creatinine Ratio: 11 (ref 10–24)
BUN: 10 mg/dL (ref 8–27)
Bilirubin Total: 0.9 mg/dL (ref 0.0–1.2)
CO2: 22 mmol/L (ref 20–29)
Calcium: 9.1 mg/dL (ref 8.6–10.2)
Chloride: 104 mmol/L (ref 96–106)
Creatinine, Ser: 0.88 mg/dL (ref 0.76–1.27)
Globulin, Total: 3.1 g/dL (ref 1.5–4.5)
Glucose: 91 mg/dL (ref 70–99)
Potassium: 4.4 mmol/L (ref 3.5–5.2)
Sodium: 140 mmol/L (ref 134–144)
Total Protein: 7 g/dL (ref 6.0–8.5)
eGFR: 86 mL/min/{1.73_m2} (ref >59)

## 2022-06-18 LAB — LIPID PANEL
Chol/HDL Ratio: 2.3 ratio (ref 0.0–5.0)
Cholesterol, Total: 125 mg/dL (ref 100–199)
HDL: 55 mg/dL (ref >39)
LDL Chol Calc (NIH): 60 mg/dL (ref 0–99)
Triglycerides: 41 mg/dL (ref 0–149)
VLDL Cholesterol Cal: 10 mg/dL (ref 5–40)

## 2022-06-18 LAB — PREALBUMIN: PREALBUMIN: 17 mg/dL (ref 9–32)

## 2022-06-18 LAB — TSH: TSH: 1.76 u[IU]/mL (ref 0.450–4.500)

## 2022-06-18 LAB — CBC
Hematocrit: 40.7 % (ref 37.5–51.0)
Hemoglobin: 13.7 g/dL (ref 13.0–17.7)
MCH: 32.7 pg (ref 26.6–33.0)
MCHC: 33.7 g/dL (ref 31.5–35.7)
MCV: 97 fL (ref 79–97)
Platelets: 169 10*3/uL (ref 150–450)
RBC: 4.19 x10E6/uL (ref 4.14–5.80)
RDW: 13.4 % (ref 11.6–15.4)
WBC: 2.7 10*3/uL — ABNORMAL LOW (ref 3.4–10.8)

## 2022-07-11 ENCOUNTER — Ambulatory Visit (INDEPENDENT_AMBULATORY_CARE_PROVIDER_SITE_OTHER): Payer: Medicare Other | Admitting: *Deleted

## 2022-07-11 DIAGNOSIS — E7800 Pure hypercholesterolemia, unspecified: Secondary | ICD-10-CM

## 2022-07-11 DIAGNOSIS — I10 Essential (primary) hypertension: Secondary | ICD-10-CM

## 2022-07-11 NOTE — Chronic Care Management (AMB) (Signed)
Chronic Care Management   CCM RN Visit Note  07/11/2022 Name: Chad Miller MRN: [id number] DOB: 29-Mar-1941  Subjective: Chad Miller is a 82 y.o. year old male who is a primary care patient of [name], MD. The patient was referred to the Chronic Care Management team for assistance with care management needs subsequent to provider initiation of CCM services and plan of care.    Today's Visit:  Engaged with patient by telephone for follow up visit.        Goals Addressed             This Visit's Progress    CCM (HYPERLIPIDEMIA) EXPECTED OUTCOME: MONITOR, SELF-MANAGE AND REDUCE SYMPTOMS OF HYPERLIPIDEMIA       Current Barriers:  Knowledge Deficits related to Hyperlipidemia management Chronic Disease Management support and education needs related to Hyperlipidemia and diet Patient reports he has all medications and taking as prescribed, does stretching at least 5 days per week Patient reports his spouse is in short term rehab recovering and will be coming back home soon  Planned Interventions: Provider established cholesterol goals reviewed; Counseled on importance of regular laboratory monitoring as prescribed; Reviewed role and benefits of statin for ASCVD risk reduction; Reviewed importance of limiting foods high in cholesterol; Reviewed exercise goals and target of 150 minutes per week; Reinforced heart healthy diet Reviewed upcoming scheduled appointments-   primary care provider visit on 12/16/22 at 1140 am  Symptom Management: Take medications as prescribed   Attend all scheduled provider appointments Call pharmacy for medication refills 3-7 days in advance of running out of medications Perform all self care activities independently  Perform IADL's (shopping, preparing meals, housekeeping, managing finances) independently Call provider office for new concerns or questions  - call for medicine refill 2 or 3 days before it runs out - take all medications  exactly as prescribed - call doctor with any symptoms you believe are related to your medicine - call doctor when you experience any new symptoms - go to all doctor appointments as scheduled - adhere to prescribed diet:  heart healthy diet - develop an exercise routine Bake and broil foods instead of frying Visit scheduled with primary care provider 12/16/22 at 1140 am  Follow Up Plan: Telephone follow up appointment with care management team member scheduled for:  09/20/22 at 1045 am       CCM (HYPERTENSION) EXPECTED OUTCOME: MONITOR, SELF-MANAGE AND REDUCE SYMPTOMS OF HYPERTENSION       Current Barriers:  Knowledge Deficits related to Hypertension management Chronic Disease Management support and education needs related to Hypertension, diet Patient reports he lives with spouse and adult son, is overall independent with all aspects of his care, continues to drive, has been checking blood pressure only on occasion because spouse is in short term rehab with plan to return home soon, adult son assists as needed.  Planned Interventions: Evaluation of current treatment plan related to hypertension self management and patient's adherence to plan as established by provider;   Reviewed medications with patient and discussed importance of compliance;  Counseled on the importance of exercise goals with target of 150 minutes per week Discussed plans with patient for ongoing care management follow up and provided patient with direct contact information for care management team; Advised patient, providing education and rationale, to monitor blood pressure daily and record, calling PCP for findings outside established parameters;  Advised patient to discuss any issues with blood pressure with provider; Discussed complications of poorly controlled blood pressure such as  heart disease, stroke, circulatory complications, vision complications, kidney impairment, sexual dysfunction;  Reinforced low sodium  diet  Symptom Management: Take medications as prescribed   Attend all scheduled provider appointments Call pharmacy for medication refills 3-7 days in advance of running out of medications Perform all self care activities independently  Perform IADL's (shopping, preparing meals, housekeeping, managing finances) independently Call provider office for new concerns or questions  check blood pressure daily choose a place to take my blood pressure (home, clinic or office, retail store) write blood pressure results in a log or diary learn about high blood pressure keep a blood pressure log take blood pressure log to all doctor appointments call doctor for signs and symptoms of high blood pressure keep all doctor appointments take medications for blood pressure exactly as prescribed begin an exercise program report new symptoms to your doctor eat more whole grains, fruits and vegetables, lean meats and healthy fats Follow low sodium diet Read food labels for sodium content  Follow Up Plan: Telephone follow up appointment with care management team member scheduled for:   09/20/22 at 1045 am          Plan:Telephone follow up appointment with care management team member scheduled for:  09/20/22 at [place] am  Jacqlyn Larsen [facility], BSN RN Case Manager [facility] [phone]

## 2022-07-11 NOTE — Patient Instructions (Signed)
Please call the care guide team at [phone] if you need to cancel or reschedule your appointment.   If you are experiencing a Mental Health or [place] or need someone to talk to, please call the Suicide and Crisis Lifeline: 988 call the [place] National Suicide Prevention Lifeline: (437)628-0192 or TTY: 484-325-6618 TTY (780)163-9717) to talk to a trained counselor call 1-800-273-TALK (toll free, 24 hour hotline) go to [facility] [address] [place] [phone]) call the [facility]: [phone] call 911   Following is a copy of the CCM Program Consent:  CCM service includes personalized support from designated clinical staff supervised by the physician, including individualized plan of care and coordination with other care providers 24/7 contact phone numbers for assistance for urgent and routine care needs. Service will only be billed when office clinical staff spend 20 minutes or more in a month to coordinate care. Only one practitioner may furnish and bill the service in a calendar month. The patient may stop CCM services at amy time (effective at the end of the month) by phone call to the office staff. The patient will be responsible for cost sharing (co-pay) or up to 20% of the service fee (after annual deductible is met)  Following is a copy of your full provider care plan:   Goals Addressed             This Visit's Progress    CCM (HYPERLIPIDEMIA) EXPECTED OUTCOME: MONITOR, SELF-MANAGE AND REDUCE SYMPTOMS OF HYPERLIPIDEMIA       Current Barriers:  Knowledge Deficits related to Hyperlipidemia management Chronic Disease Management support and education needs related to Hyperlipidemia and diet Patient reports he has all medications and taking as prescribed, does stretching at least 5 days per week Patient reports his spouse is in short term rehab recovering and will be coming back home  soon  Planned Interventions: Provider established cholesterol goals reviewed; Counseled on importance of regular laboratory monitoring as prescribed; Reviewed role and benefits of statin for ASCVD risk reduction; Reviewed importance of limiting foods high in cholesterol; Reviewed exercise goals and target of 150 minutes per week; Reinforced heart healthy diet Reviewed upcoming scheduled appointments-   primary care provider visit on 12/16/22 at 1140 am  Symptom Management: Take medications as prescribed   Attend all scheduled provider appointments Call pharmacy for medication refills 3-7 days in advance of running out of medications Perform all self care activities independently  Perform IADL's (shopping, preparing meals, housekeeping, managing finances) independently Call provider office for new concerns or questions  - call for medicine refill 2 or 3 days before it runs out - take all medications exactly as prescribed - call doctor with any symptoms you believe are related to your medicine - call doctor when you experience any new symptoms - go to all doctor appointments as scheduled - adhere to prescribed diet:  heart healthy diet - develop an exercise routine Bake and broil foods instead of frying Visit scheduled with primary care provider 12/16/22 at 1140 am  Follow Up Plan: Telephone follow up appointment with care management team member scheduled for:  09/20/22 at 1045 am       CCM (HYPERTENSION) EXPECTED OUTCOME: MONITOR, SELF-MANAGE AND REDUCE SYMPTOMS OF HYPERTENSION       Current Barriers:  Knowledge Deficits related to Hypertension management Chronic Disease Management support and education needs related to Hypertension, diet Patient reports he lives with spouse and adult son, is overall independent with all  aspects of his care, continues to drive, has been checking blood pressure only on occasion because spouse is in short term rehab with plan to return home soon, adult  son assists as needed.  Planned Interventions: Evaluation of current treatment plan related to hypertension self management and patient's adherence to plan as established by provider;   Reviewed medications with patient and discussed importance of compliance;  Counseled on the importance of exercise goals with target of 150 minutes per week Discussed plans with patient for ongoing care management follow up and provided patient with direct contact information for care management team; Advised patient, providing education and rationale, to monitor blood pressure daily and record, calling PCP for findings outside established parameters;  Advised patient to discuss any issues with blood pressure with provider; Discussed complications of poorly controlled blood pressure such as heart disease, stroke, circulatory complications, vision complications, kidney impairment, sexual dysfunction;  Reinforced low sodium diet  Symptom Management: Take medications as prescribed   Attend all scheduled provider appointments Call pharmacy for medication refills 3-7 days in advance of running out of medications Perform all self care activities independently  Perform IADL's (shopping, preparing meals, housekeeping, managing finances) independently Call provider office for new concerns or questions  check blood pressure daily choose a place to take my blood pressure (home, clinic or office, retail store) write blood pressure results in a log or diary learn about high blood pressure keep a blood pressure log take blood pressure log to all doctor appointments call doctor for signs and symptoms of high blood pressure keep all doctor appointments take medications for blood pressure exactly as prescribed begin an exercise program report new symptoms to your doctor eat more whole grains, fruits and vegetables, lean meats and healthy fats Follow low sodium diet Read food labels for sodium content  Follow Up Plan:  Telephone follow up appointment with care management team member scheduled for:   09/20/22 at 1045 am          Patient verbalizes understanding of instructions and care plan provided today and agrees to view in [place]. Active MyChart status and patient understanding of how to access instructions and care plan via MyChart confirmed with patient.     Telephone follow up appointment with care management team member scheduled for:  09/20/22 at 1045 am

## 2022-07-18 DIAGNOSIS — I10 Essential (primary) hypertension: Secondary | ICD-10-CM | POA: Diagnosis not present

## 2022-07-18 DIAGNOSIS — E785 Hyperlipidemia, unspecified: Secondary | ICD-10-CM

## 2022-09-19 ENCOUNTER — Ambulatory Visit (INDEPENDENT_AMBULATORY_CARE_PROVIDER_SITE_OTHER): Payer: Medicare Other | Admitting: Internal Medicine

## 2022-09-19 ENCOUNTER — Encounter: Payer: Self-pay | Admitting: Internal Medicine

## 2022-09-19 VITALS — BP 132/90 | HR 59 | Temp 98.1°F | Ht 70.0 in | Wt 149.6 lb

## 2022-09-19 DIAGNOSIS — S81009A Unspecified open wound, unspecified knee, initial encounter: Secondary | ICD-10-CM

## 2022-09-19 DIAGNOSIS — I1310 Hypertensive heart and chronic kidney disease without heart failure, with stage 1 through stage 4 chronic kidney disease, or unspecified chronic kidney disease: Secondary | ICD-10-CM | POA: Diagnosis not present

## 2022-09-19 DIAGNOSIS — I10 Essential (primary) hypertension: Secondary | ICD-10-CM

## 2022-09-19 DIAGNOSIS — I739 Peripheral vascular disease, unspecified: Secondary | ICD-10-CM

## 2022-09-19 DIAGNOSIS — N182 Chronic kidney disease, stage 2 (mild): Secondary | ICD-10-CM

## 2022-09-19 DIAGNOSIS — E7800 Pure hypercholesterolemia, unspecified: Secondary | ICD-10-CM

## 2022-09-19 DIAGNOSIS — Z9989 Dependence on other enabling machines and devices: Secondary | ICD-10-CM

## 2022-09-19 MED ORDER — MUPIROCIN 2 % EX OINT: 1.0000 | TOPICAL_OINTMENT | Freq: Two times a day (BID) | CUTANEOUS | 0 refills | Status: AC

## 2022-09-19 NOTE — Progress Notes (Addendum)
I,[name],acting as a scribe for Gwynneth Aliment, MD.,have documented all relevant documentation on the behalf of Gwynneth Aliment, MD,as directed by  Gwynneth Aliment, MD while in the presence of Gwynneth Aliment, MD.    Subjective:     Patient ID: Chad Miller , male    DOB: June 17, 1940 , 82 y.o.   MRN: [id number]   Chief Complaint  Patient presents with   Hypertension   Hyperlipidemia    HPI  He presents today for BP check. He reports compliance with meds. He denies headaches, chest pain and visual disturbances. He is also followed by cardiology, Dr. Jacinto Halim.   He has been busy caring for his wife who has had a pressure ulcer.  He reports noticing blisters on his legs. He does not know the cause of the blisters.   Hypertension This is a chronic problem. The current episode started more than 1 year ago. The problem has been gradually improving since onset. The problem is controlled. Pertinent negatives include no blurred vision. Risk factors for coronary artery disease include dyslipidemia and male gender. Past treatments include angiotensin blockers. Hypertensive end-organ damage includes kidney disease. Identifiable causes of hypertension include chronic renal disease.  Hyperlipidemia This is a chronic problem. The current episode started more than 1 year ago. Exacerbating diseases include chronic renal disease. Current antihyperlipidemic treatment includes statins. The current treatment provides moderate improvement of lipids. Compliance problems include adherence to exercise.      Past Medical History:  Diagnosis Date   High cholesterol      Family History  Problem Relation Age of Onset   Heart disease Mother    Lung cancer Father    Asthma Sister    Heart disease Brother    Heart disease Brother    Allergic rhinitis Neg Hx    Eczema Neg Hx    Urticaria Neg Hx      Current Outpatient Medications:    acetaminophen (TYLENOL) 325 MG tablet, Take 650 mg by  mouth every 6 (six) hours as needed., Disp: , Rfl:    amLODipine (NORVASC) 5 MG tablet, Take 1 tablet (5 mg total) by mouth every evening., Disp: 90 tablet, Rfl: 3   atorvastatin (LIPITOR) 10 MG tablet, Take 1 tablet (10 mg total) by mouth daily., Disp: 90 tablet, Rfl: 2   mupirocin ointment (BACTROBAN) 2 %, Apply 1 Application topically 2 (two) times daily., Disp: 30 g, Rfl: 0   nebivolol (BYSTOLIC) 10 MG tablet, Take 1 tablet (10 mg total) by mouth every morning., Disp: 90 tablet, Rfl: 2   Nutritional Supplements (EQUATE) LIQD, Take by mouth daily., Disp: , Rfl:    olmesartan (BENICAR) 20 MG tablet, Take 1 tablet (20 mg total) by mouth every morning., Disp: 90 tablet, Rfl: 2   No Known Allergies   Review of Systems  Constitutional: Negative.   Eyes:  Negative for blurred vision.  Respiratory: Negative.    Cardiovascular: Negative.   Endocrine: Negative.   Skin:  Positive for rash.       He c/o blisters on his legs, he popped them both. They were located on his knees. He denies fall/trauma. He has been using a "wound cleaner" on them. He is not sure what caused these lesions.   Allergic/Immunologic: Negative.   Hematological: Negative.      Today's Vitals   09/19/22 1502  BP: (!) 132/90  Pulse: (!) 59  Temp: 98.1 F (36.7 C)  SpO2: 98%  Weight: 149 lb  9.6 oz (67.9 kg)  Height: 5\' 10"  (1.778 m)   Body mass index is 21.47 kg/m.  Wt Readings from Last 3 Encounters:  09/19/22 149 lb 9.6 oz (67.9 kg)  06/17/22 148 lb 6.4 oz (67.3 kg)  05/16/22 160 lb (72.6 kg)    Objective:  Physical Exam Vitals and nursing note reviewed.  Constitutional:      Appearance: Normal appearance.  HENT:     Head: Normocephalic and atraumatic.  Eyes:     Extraocular Movements: Extraocular movements intact.     Pupils: Pupils are equal, round, and reactive to light.  Cardiovascular:     Rate and Rhythm: Normal rate and regular rhythm.     Heart sounds: Normal heart sounds.  Pulmonary:      Effort: Pulmonary effort is normal.     Breath sounds: Normal breath sounds.  Musculoskeletal:     Cervical back: Normal range of motion.     Comments: Ambulatory with walker  Skin:    General: Skin is warm.     Findings: Lesion present.     Comments: Wound on b/l knees, pos escar, no surrounding erythema  Neurological:     General: No focal deficit present.     Mental Status: He is alert.  Psychiatric:        Mood and Affect: Mood normal.      Assessment And Plan:     1. Hypertensive heart and renal disease with renal failure, stage 1 through stage 4 or unspecified chronic kidney disease, without heart failure Comments: Chronic, fair control. No change w/ repeat BP check. Advised to follow low sodium diet. No med changes today.  2. Peripheral artery disease (HCC) Comments: Chronic, advised to follow heart healthy lifestyle. Importance of statin therapy and regular exercise was d/w patient.  3. Chronic renal disease, stage II Comments: Chronic, encouraged to stay well hydrated.  4. Open wound of knee, unspecified laterality, initial encounter Comments: He agrees to Vascular evaluation due to underlying PAD. - VAS [place] ABI WITH/WO TBI; Future     Patient was given opportunity to ask questions. Patient verbalized understanding of the plan and was able to repeat key elements of the plan. All questions were answered to their satisfaction.   I, Gwynneth Aliment, MD, have reviewed all documentation for this visit. The documentation on 09/29/22 for the exam, diagnosis, procedures, and orders are all accurate and complete.   IF YOU HAVE BEEN REFERRED TO A SPECIALIST, IT MAY TAKE 1-2 WEEKS TO SCHEDULE/PROCESS THE REFERRAL. IF YOU HAVE NOT HEARD FROM US/SPECIALIST IN TWO WEEKS, PLEASE GIVE [place] A CALL AT [phone].   THE PATIENT IS ENCOURAGED TO PRACTICE SOCIAL DISTANCING DUE TO THE COVID-19 PANDEMIC.

## 2022-09-19 NOTE — Patient Instructions (Signed)
Hypertension, Adult ?Hypertension is another name for high blood pressure. High blood pressure forces your heart to work harder to pump blood. This can cause problems over time. ?There are two numbers in a blood pressure reading. There is a top number (systolic) over a bottom number (diastolic). It is best to have a blood pressure that is below 120/80. ?What are the causes? ?The cause of this condition is not known. Some other conditions can lead to high blood pressure. ?What increases the risk? ?Some lifestyle factors can make you more likely to develop high blood pressure: ?Smoking. ?Not getting enough exercise or physical activity. ?Being overweight. ?Having too much fat, sugar, calories, or salt (sodium) in your diet. ?Drinking too much alcohol. ?Other risk factors include: ?Having any of these conditions: ?Heart disease. ?Diabetes. ?High cholesterol. ?Kidney disease. ?Obstructive sleep apnea. ?Having a family history of high blood pressure and high cholesterol. ?Age. The risk increases with age. ?Stress. ?What are the signs or symptoms? ?High blood pressure may not cause symptoms. Very high blood pressure (hypertensive crisis) may cause: ?Headache. ?Fast or uneven heartbeats (palpitations). ?Shortness of breath. ?Nosebleed. ?Vomiting or feeling like you may vomit (nauseous). ?Changes in how you see. ?Very bad chest pain. ?Feeling dizzy. ?Seizures. ?How is this treated? ?This condition is treated by making healthy lifestyle changes, such as: ?Eating healthy foods. ?Exercising more. ?Drinking less alcohol. ?Your doctor may prescribe medicine if lifestyle changes do not help enough and if: ?Your top number is above 130. ?Your bottom number is above 80. ?Your personal target blood pressure may vary. ?Follow these instructions at home: ?Eating and drinking ? ?If told, follow the DASH eating plan. To follow this plan: ?Fill one half of your plate at each meal with fruits and vegetables. ?Fill one fourth of your plate  at each meal with whole grains. Whole grains include whole-wheat pasta, brown rice, and whole-grain bread. ?Eat or drink low-fat dairy products, such as skim milk or low-fat yogurt. ?Fill one fourth of your plate at each meal with low-fat (lean) proteins. Low-fat proteins include fish, chicken without skin, eggs, beans, and tofu. ?Avoid fatty meat, cured and processed meat, or chicken with skin. ?Avoid pre-made or processed food. ?Limit the amount of salt in your diet to less than 1,500 mg each day. ?Do not drink alcohol if: ?Your doctor tells you not to drink. ?You are pregnant, may be pregnant, or are planning to become pregnant. ?If you drink alcohol: ?Limit how much you have to: ?0-1 drink a day for women. ?0-2 drinks a day for men. ?Know how much alcohol is in your drink. In the [place] one drink equals one 12 oz bottle of beer (355 mL), one 5 oz glass of wine (148 mL), or one 1? oz glass of hard liquor (44 mL). ?Lifestyle ? ?Work with your doctor to stay at a healthy weight or to lose weight. Ask your doctor what the best weight is for you. ?Get at least 30 minutes of exercise that causes your heart to beat faster (aerobic exercise) most days of the week. This may include walking, swimming, or biking. ?Get at least 30 minutes of exercise that strengthens your muscles (resistance exercise) at least 3 days a week. This may include lifting weights or doing Pilates. ?Do not smoke or use any products that contain nicotine or tobacco. If you need help quitting, ask your doctor. ?Check your blood pressure at home as told by your doctor. ?Keep all follow-up visits. ?Medicines ?Take over-the-counter and prescription medicines   only as told by your doctor. Follow directions carefully. ?Do not skip doses of blood pressure medicine. The medicine does not work as well if you skip doses. Skipping doses also puts you at risk for problems. ?Ask your doctor about side effects or reactions to medicines that you should watch  for. ?Contact a doctor if: ?You think you are having a reaction to the medicine you are taking. ?You have headaches that keep coming back. ?You feel dizzy. ?You have swelling in your ankles. ?You have trouble with your vision. ?Get help right away if: ?You get a very bad headache. ?You start to feel mixed up (confused). ?You feel weak or numb. ?You feel faint. ?You have very bad pain in your: ?Chest. ?Belly (abdomen). ?You vomit more than once. ?You have trouble breathing. ?These symptoms may be an emergency. Get help right away. Call 911. ?Do not wait to see if the symptoms will go away. ?Do not drive yourself to the hospital. ?Summary ?Hypertension is another name for high blood pressure. ?High blood pressure forces your heart to work harder to pump blood. ?For most people, a normal blood pressure is less than 120/80. ?Making healthy choices can help lower blood pressure. If your blood pressure does not get lower with healthy choices, you may need to take medicine. ?This information is not intended to replace advice given to you by your health care provider. Make sure you discuss any questions you have with your health care provider. ?Document Revised: 02/22/2021 Document Reviewed: 02/22/2021 ?Elsevier Patient Education ? 2023 [employer] Inc[employer] ? ?

## 2022-09-20 ENCOUNTER — Telehealth: Payer: Medicare Other

## 2022-09-20 ENCOUNTER — Telehealth: Payer: Self-pay | Admitting: *Deleted

## 2022-09-20 NOTE — Telephone Encounter (Signed)
   CCM RN Visit Note   09/20/22  Name: [name] MRN: [id number]      DOB: 1941-01-15  Subjective: Chad Miller is a 82 y.o. year old male who is a primary care patient of [name] MD. The patient was referred to the Chronic Care Management team for assistance with care management needs subsequent to provider initiation of CCM services and plan of care.      An unsuccessful telephone outreach was attempted today to contact the patient about Chronic Care Management needs.    Plan:Telephone follow up appointment with care management team member scheduled for:  upon care guide rescheduling.  Irving Shows [facility], BSN RN Case Manager [facility] [phone]

## 2022-09-29 DIAGNOSIS — S81009A Unspecified open wound, unspecified knee, initial encounter: Secondary | ICD-10-CM | POA: Insufficient documentation

## 2022-10-01 ENCOUNTER — Ambulatory Visit (INDEPENDENT_AMBULATORY_CARE_PROVIDER_SITE_OTHER): Payer: Medicare Other | Admitting: *Deleted

## 2022-10-01 DIAGNOSIS — I10 Essential (primary) hypertension: Secondary | ICD-10-CM

## 2022-10-01 DIAGNOSIS — E7800 Pure hypercholesterolemia, unspecified: Secondary | ICD-10-CM

## 2022-10-01 NOTE — Patient Instructions (Signed)
Please call the care guide team at [phone] if you need to cancel or reschedule your appointment.   If you are experiencing a Mental Health or Behavioral Health Crisis or need someone to talk to, please call the Suicide and Crisis Lifeline: 988 call the [place] National Suicide Prevention Lifeline: (618)374-7517 or TTY: 934-419-2632 TTY (223)571-5187) to talk to a trained counselor call 1-800-273-TALK (toll free, 24 hour hotline) go to [facility] [address], [place] [phone]) call the [facility]: [phone] call 911   Following is a copy of the CCM Program Consent:  CCM service includes personalized support from designated clinical staff supervised by the physician, including individualized plan of care and coordination with other care providers 24/7 contact phone numbers for assistance for urgent and routine care needs. Service will only be billed when office clinical staff spend 20 minutes or more in a month to coordinate care. Only one practitioner may furnish and bill the service in a calendar month. The patient may stop CCM services at amy time (effective at the end of the month) by phone call to the office staff. The patient will be responsible for cost sharing (co-pay) or up to 20% of the service fee (after annual deductible is met)  Following is a copy of your full provider care plan:   Goals Addressed             This Visit's Progress    CCM (HYPERLIPIDEMIA) EXPECTED OUTCOME: MONITOR, SELF-MANAGE AND REDUCE SYMPTOMS OF HYPERLIPIDEMIA       Current Barriers:  Knowledge Deficits related to Hyperlipidemia management Chronic Disease Management support and education needs related to Hyperlipidemia and diet Patient reports he has all medications and taking as prescribed, does stretching at least 5 days per week Patient reports his spouse did come home from short term rehab and he has been helping her with  recovery  Planned Interventions: Provider established cholesterol goals reviewed; Counseled on importance of regular laboratory monitoring as prescribed; Reviewed role and benefits of statin for ASCVD risk reduction; Reviewed importance of limiting foods high in cholesterol; Reviewed exercise goals and target of 150 minutes per week; Reviewed heart healthy diet and importance of healthy, nutritious food choices Reviewed upcoming scheduled appointments-   primary care provider visit on 12/16/22 at 1140 am  Symptom Management: Take medications as prescribed   Attend all scheduled provider appointments Call pharmacy for medication refills 3-7 days in advance of running out of medications Perform all self care activities independently  Perform IADL's (shopping, preparing meals, housekeeping, managing finances) independently Call provider office for new concerns or questions  - call for medicine refill 2 or 3 days before it runs out - take all medications exactly as prescribed - call doctor with any symptoms you believe are related to your medicine - call doctor when you experience any new symptoms - go to all doctor appointments as scheduled - adhere to prescribed diet:  heart healthy diet - develop an exercise routine Bake and broil foods instead of frying Read food labels for sodium, carbohydrate and fat content Visit scheduled with primary care provider 12/16/22 at 1140 am  Follow Up Plan: Telephone follow up appointment with care management team member scheduled for:   12/30/22 at 130 pm       CCM (HYPERTENSION) EXPECTED OUTCOME: MONITOR, SELF-MANAGE AND REDUCE SYMPTOMS OF HYPERTENSION       Current Barriers:  Knowledge Deficits related to Hypertension management Chronic Disease Management support and education  needs related to Hypertension, diet Patient reports he lives with spouse and adult son, is overall independent with all aspects of his care, continues to drive, has been  checking blood pressure only on occasion because spouse is in short term rehab with plan to return home soon, adult son assists as needed.  Planned Interventions: Evaluation of current treatment plan related to hypertension self management and patient's adherence to plan as established by provider;   Reviewed medications with patient and discussed importance of compliance;  Counseled on the importance of exercise goals with target of 150 minutes per week Advised patient, providing education and rationale, to monitor blood pressure daily and record, calling PCP for findings outside established parameters;  Advised patient to discuss any issues with blood pressure with provider; Discussed complications of poorly controlled blood pressure such as heart disease, stroke, circulatory complications, vision complications, kidney impairment, sexual dysfunction;  Reviewed low sodium diet and importance of reading labels  Symptom Management: Take medications as prescribed   Attend all scheduled provider appointments Call pharmacy for medication refills 3-7 days in advance of running out of medications Perform all self care activities independently  Perform IADL's (shopping, preparing meals, housekeeping, managing finances) independently Call provider office for new concerns or questions  check blood pressure daily choose a place to take my blood pressure (home, clinic or office, retail store) write blood pressure results in a log or diary learn about high blood pressure keep a blood pressure log take blood pressure log to all doctor appointments call doctor for signs and symptoms of high blood pressure keep all doctor appointments take medications for blood pressure exactly as prescribed begin an exercise program report new symptoms to your doctor eat more whole grains, fruits and vegetables, lean meats and healthy fats Follow low sodium diet Read food labels for sodium content Limit fast  food  Follow Up Plan: Telephone follow up appointment with care management team member scheduled for:   12/30/22 at 130 pm          Patient verbalizes understanding of instructions and care plan provided today and agrees to view in MyChart. Active MyChart status and patient understanding of how to access instructions and care plan via MyChart confirmed with patient.  Telephone follow up appointment with care management team member scheduled for:  12/30/22 at 130 pm

## 2022-10-01 NOTE — Chronic Care Management (AMB) (Signed)
Chronic Care Management   CCM RN Visit Note  10/01/2022 Name: Chad Miller MRN: [id number] DOB: 02/27/1941  Subjective: Chad Miller is a 82 y.o. year old male who is a primary care patient of [name], MD. The patient was referred to the Chronic Care Management team for assistance with care management needs subsequent to provider initiation of CCM services and plan of care.    Today's Visit:  Engaged with patient by telephone for follow up visit.        Goals Addressed             This Visit's Progress    CCM (HYPERLIPIDEMIA) EXPECTED OUTCOME: MONITOR, SELF-MANAGE AND REDUCE SYMPTOMS OF HYPERLIPIDEMIA       Current Barriers:  Knowledge Deficits related to Hyperlipidemia management Chronic Disease Management support and education needs related to Hyperlipidemia and diet Patient reports he has all medications and taking as prescribed, does stretching at least 5 days per week Patient reports his spouse did come home from short term rehab and he has been helping her with recovery  Planned Interventions: Provider established cholesterol goals reviewed; Counseled on importance of regular laboratory monitoring as prescribed; Reviewed role and benefits of statin for ASCVD risk reduction; Reviewed importance of limiting foods high in cholesterol; Reviewed exercise goals and target of 150 minutes per week; Reviewed heart healthy diet and importance of healthy, nutritious food choices Reviewed upcoming scheduled appointments-   primary care provider visit on 12/16/22 at 1140 am  Symptom Management: Take medications as prescribed   Attend all scheduled provider appointments Call pharmacy for medication refills 3-7 days in advance of running out of medications Perform all self care activities independently  Perform IADL's (shopping, preparing meals, housekeeping, managing finances) independently Call provider office for new concerns or questions  - call for medicine refill 2  or 3 days before it runs out - take all medications exactly as prescribed - call doctor with any symptoms you believe are related to your medicine - call doctor when you experience any new symptoms - go to all doctor appointments as scheduled - adhere to prescribed diet:  heart healthy diet - develop an exercise routine Bake and broil foods instead of frying Read food labels for sodium, carbohydrate and fat content Visit scheduled with primary care provider 12/16/22 at 1140 am  Follow Up Plan: Telephone follow up appointment with care management team member scheduled for:   12/30/22 at 130 pm       CCM (HYPERTENSION) EXPECTED OUTCOME: MONITOR, SELF-MANAGE AND REDUCE SYMPTOMS OF HYPERTENSION       Current Barriers:  Knowledge Deficits related to Hypertension management Chronic Disease Management support and education needs related to Hypertension, diet Patient reports he lives with spouse and adult son, is overall independent with all aspects of his care, continues to drive, has been checking blood pressure only on occasion because spouse is in short term rehab with plan to return home soon, adult son assists as needed.  Planned Interventions: Evaluation of current treatment plan related to hypertension self management and patient's adherence to plan as established by provider;   Reviewed medications with patient and discussed importance of compliance;  Counseled on the importance of exercise goals with target of 150 minutes per week Advised patient, providing education and rationale, to monitor blood pressure daily and record, calling PCP for findings outside established parameters;  Advised patient to discuss any issues with blood pressure with provider; Discussed complications of poorly controlled blood pressure such as heart disease,  stroke, circulatory complications, vision complications, kidney impairment, sexual dysfunction;  Reviewed low sodium diet and importance of reading  labels  Symptom Management: Take medications as prescribed   Attend all scheduled provider appointments Call pharmacy for medication refills 3-7 days in advance of running out of medications Perform all self care activities independently  Perform IADL's (shopping, preparing meals, housekeeping, managing finances) independently Call provider office for new concerns or questions  check blood pressure daily choose a place to take my blood pressure (home, clinic or office, retail store) write blood pressure results in a log or diary learn about high blood pressure keep a blood pressure log take blood pressure log to all doctor appointments call doctor for signs and symptoms of high blood pressure keep all doctor appointments take medications for blood pressure exactly as prescribed begin an exercise program report new symptoms to your doctor eat more whole grains, fruits and vegetables, lean meats and healthy fats Follow low sodium diet Read food labels for sodium content Limit fast food  Follow Up Plan: Telephone follow up appointment with care management team member scheduled for:   12/30/22 at 130 pm          Plan:Telephone follow up appointment with care management team member scheduled for:  12/30/22 at 130 pm  Irving Shows [facility], BSN RN Case Manager [facility] [phone]

## 2022-10-08 ENCOUNTER — Other Ambulatory Visit (HOSPITAL_COMMUNITY): Payer: Self-pay | Admitting: Internal Medicine

## 2022-10-08 ENCOUNTER — Ambulatory Visit (HOSPITAL_COMMUNITY)
Admission: RE | Admit: 2022-10-08 | Discharge: 2022-10-08 | Disposition: A | Discharge status: Home / Self Care | Payer: Medicare Other | Source: Ambulatory Visit | Attending: Cardiology | Admitting: Cardiology

## 2022-10-08 DIAGNOSIS — S81009A Unspecified open wound, unspecified knee, initial encounter: Secondary | ICD-10-CM

## 2022-10-08 DIAGNOSIS — I739 Peripheral vascular disease, unspecified: Secondary | ICD-10-CM

## 2022-10-08 DIAGNOSIS — S81802A Unspecified open wound, left lower leg, initial encounter: Secondary | ICD-10-CM | POA: Diagnosis not present

## 2022-10-08 LAB — VAS US ABI WITH/WO TBI

## 2022-10-18 ENCOUNTER — Ambulatory Visit (HOSPITAL_COMMUNITY)
Admission: RE | Admit: 2022-10-18 | Discharge: 2022-10-18 | Disposition: A | Discharge status: Home / Self Care | Payer: Medicare Other | Source: Ambulatory Visit | Attending: Cardiology | Admitting: Cardiology

## 2022-10-18 DIAGNOSIS — E785 Hyperlipidemia, unspecified: Secondary | ICD-10-CM | POA: Diagnosis not present

## 2022-10-18 DIAGNOSIS — I739 Peripheral vascular disease, unspecified: Secondary | ICD-10-CM | POA: Insufficient documentation

## 2022-10-18 DIAGNOSIS — I10 Essential (primary) hypertension: Secondary | ICD-10-CM

## 2022-10-20 ENCOUNTER — Other Ambulatory Visit: Payer: Self-pay | Admitting: Internal Medicine

## 2022-10-20 DIAGNOSIS — I739 Peripheral vascular disease, unspecified: Secondary | ICD-10-CM

## 2022-10-24 ENCOUNTER — Ambulatory Visit (INDEPENDENT_AMBULATORY_CARE_PROVIDER_SITE_OTHER): Payer: Medicare Other | Admitting: Vascular Surgery

## 2022-10-24 ENCOUNTER — Encounter: Payer: Self-pay | Admitting: Vascular Surgery

## 2022-10-24 VITALS — BP 146/70 | HR 50 | Temp 98.2°F | Resp 20 | Ht 70.0 in | Wt 146.0 lb

## 2022-10-24 DIAGNOSIS — I70219 Atherosclerosis of native arteries of extremities with intermittent claudication, unspecified extremity: Secondary | ICD-10-CM | POA: Diagnosis not present

## 2022-10-24 NOTE — Progress Notes (Signed)
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  This is confirmed by his duplex study that was done on 10/20/2022.  Fortunately he is asymptomatic.  He denies any claudication, rest pain, or nonhealing ulcers.  I have encouraged him to stay as active as possible.  We have also discussed the importance of nutrition. I have recommended that he begin taking 81 mg of aspirin daily given his tibial artery occlusive disease.  I do not think routine follow-up is necessary unless he develops disabling claudication, rest pain, or nonhealing ulcer.  We will see him as needed.  REASON FOR CONSULT:    Peripheral arterial disease.  The consult is requested by Dr. Allyne Gee.  HPI:   Chad Miller is a 82 y.o. male who was referred for evaluation of peripheral arterial disease.  On my history, the patient denies any history of claudication.  He denies any history of rest pain.  He did have some blisters around his knee which he popped and developed some superficial wounds but these have healed adequately.  He has undergone previous left total knee replacement.  He does take a statin but is not on aspirin.  Fortunately, he is not a smoker.  Past Medical History:  Diagnosis Date   High cholesterol     Family History  Problem Relation Age of Onset   Heart disease Mother    Lung cancer Father    Asthma Sister    Heart disease Brother    Heart disease Brother    Allergic rhinitis Neg Hx    Eczema Neg Hx    Urticaria Neg Hx     SOCIAL HISTORY: Social History   Tobacco Use   Smoking status: Never   Smokeless tobacco: Never  Substance Use Topics   Alcohol use: No    Comment: Quit 2015    No Known Allergies  Current Outpatient Medications  Medication Sig Dispense Refill   acetaminophen (TYLENOL) 325 MG tablet Take 650 mg by mouth every 6 (six) hours as needed.     amLODipine (NORVASC) 5 MG tablet Take 1  tablet (5 mg total) by mouth every evening. 90 tablet 3   atorvastatin (LIPITOR) 10 MG tablet Take 1 tablet (10 mg total) by mouth daily. 90 tablet 2   mupirocin ointment (BACTROBAN) 2 % Apply 1 Application topically 2 (two) times daily. 30 g 0   nebivolol (BYSTOLIC) 10 MG tablet Take 1 tablet (10 mg total) by mouth every morning. 90 tablet 2   Nutritional Supplements (EQUATE) LIQD Take by mouth daily.     olmesartan (BENICAR) 20 MG tablet Take 1 tablet (20 mg total) by mouth every morning. 90 tablet 2   No current facility-administered medications for this visit.    REVIEW OF SYSTEMS:  [X]  denotes positive finding, [ ]  denotes negative finding Cardiac  Comments:  Chest pain or chest pressure:    Shortness of breath upon exertion:    Short of breath when lying flat:    Irregular heart rhythm:        Vascular    Pain in calf, thigh, or hip brought on by ambulation:    Pain in feet at night that wakes you up from your sleep:     Blood clot in your veins:    Leg swelling:         Pulmonary    Oxygen at home:    Productive cough:  Wheezing:         Neurologic    Sudden weakness in arms or legs:     Sudden numbness in arms or legs:     Sudden onset of difficulty speaking or slurred speech:    Temporary loss of vision in one eye:     Problems with dizziness:         Gastrointestinal    Blood in stool:     Vomited blood:         Genitourinary    Burning when urinating:     Blood in urine:        Psychiatric    Major depression:         Hematologic    Bleeding problems:    Problems with blood clotting too easily:        Skin    Rashes or ulcers:        Constitutional    Fever or chills:    -  PHYSICAL EXAM:   Vitals:   10/24/22 1508  BP: (!) 146/70  Pulse: (!) 50  Resp: 20  Temp: 98.2 F (36.8 C)  SpO2: 98%  Weight: 146 lb (66.2 kg)  Height: 5\' 10"  (1.778 m)   Body mass index is 20.95 kg/m. GENERAL: The patient is a well-nourished male, in no acute  distress. The vital signs are documented above. CARDIAC: There is a regular rate and rhythm.  VASCULAR: I do not detect carotid bruits. The patient has palpable femoral and popliteal pulses bilaterally.  I do not palpate pedal pulses.  Both feet are adequately perfused.  He has no ischemic ulcers. He has no significant leg swelling. PULMONARY: There is good air exchange bilaterally without wheezing or rales. ABDOMEN: Soft and non-tender with normal pitched bowel sounds.  MUSCULOSKELETAL: There are no major deformities. NEUROLOGIC: No focal weakness or paresthesias are detected. SKIN: There are no ulcers or rashes noted. PSYCHIATRIC: The patient has a normal affect.  DATA:    ARTERIAL DOPPLER STUDY: I have reviewed the arterial Doppler study that was done on 10/08/2022.  On the right side there was a monophasic posterior tibial and dorsalis pedis signal.  ABI could not be obtained as the arteries were calcified.  Toe pressure was 49 mmHg.  On the left side there was a monophasic posterior tibial and dorsalis pedis signal.  ABI was 100% although likely falsely elevated secondary to calcific disease.  Toe pressure was 60 mmHg.  ARTERIAL DUPLEX.:  I have reviewed the arterial duplex scan that was done on 10/20/2022.  The patient had multiphasic flow in the common femoral artery.  The superficial femoral artery had biphasic flow as did the popliteal artery.  There is multiphasic flow in the tibial vessels.  There was not evidence of a moderate distal anterior tibial artery stenosis and the posterior tibial artery was occluded distally.  Waverly Ferrari [facility]

## 2022-12-13 DIAGNOSIS — H40012 Open angle with borderline findings, low risk, left eye: Secondary | ICD-10-CM | POA: Diagnosis not present

## 2022-12-13 DIAGNOSIS — H2512 Age-related nuclear cataract, left eye: Secondary | ICD-10-CM | POA: Diagnosis not present

## 2022-12-16 ENCOUNTER — Ambulatory Visit (INDEPENDENT_AMBULATORY_CARE_PROVIDER_SITE_OTHER): Payer: Medicare Other | Admitting: Internal Medicine

## 2022-12-16 ENCOUNTER — Encounter: Payer: Self-pay | Admitting: Internal Medicine

## 2022-12-16 VITALS — BP 120/70 | HR 60 | Temp 98.6°F | Ht 70.0 in | Wt 146.0 lb

## 2022-12-16 DIAGNOSIS — Z636 Dependent relative needing care at home: Secondary | ICD-10-CM

## 2022-12-16 DIAGNOSIS — N182 Chronic kidney disease, stage 2 (mild): Secondary | ICD-10-CM

## 2022-12-16 DIAGNOSIS — E7800 Pure hypercholesterolemia, unspecified: Secondary | ICD-10-CM | POA: Diagnosis not present

## 2022-12-16 DIAGNOSIS — I1310 Hypertensive heart and chronic kidney disease without heart failure, with stage 1 through stage 4 chronic kidney disease, or unspecified chronic kidney disease: Secondary | ICD-10-CM

## 2022-12-16 DIAGNOSIS — D72818 Other decreased white blood cell count: Secondary | ICD-10-CM | POA: Diagnosis not present

## 2022-12-16 DIAGNOSIS — I739 Peripheral vascular disease, unspecified: Secondary | ICD-10-CM

## 2022-12-16 DIAGNOSIS — D72819 Decreased white blood cell count, unspecified: Secondary | ICD-10-CM | POA: Insufficient documentation

## 2022-12-16 DIAGNOSIS — R634 Abnormal weight loss: Secondary | ICD-10-CM | POA: Diagnosis not present

## 2022-12-16 NOTE — Assessment & Plan Note (Signed)
Chronic, fair control. Goal BP<130/80. No med changes today. Encouraged to follow low sodium diet. He will f/u in 6 months for re-evaluation.

## 2022-12-16 NOTE — Assessment & Plan Note (Addendum)
He declines SW evaluation to discuss caregiver stress. He prefers to have a discussion with his wife.

## 2022-12-16 NOTE — Assessment & Plan Note (Signed)
Chronic, I will check renal function today. He is encouraged to keep BP controlled and stay well hydrated to prevent progression of CKD.

## 2022-12-16 NOTE — Assessment & Plan Note (Addendum)
Chronic, encouraged to increase daily activity. He is reminded chair exercises count as well.  He is on statin therapy. Cardiology input is appreciated.

## 2022-12-16 NOTE — Progress Notes (Signed)
I,Jameka J Llittleton, CMA,acting as a Neurosurgeon for Gwynneth Aliment, MD.,have documented all relevant documentation on the behalf of Gwynneth Aliment, MD,as directed by  Gwynneth Aliment, MD while in the presence of Gwynneth Aliment, MD.  Subjective:  Patient ID: Chad Miller , male    DOB: December 11, 1940 , 82 y.o.   MRN: [id number]  Chief Complaint  Patient presents with   Hypertension    HPI  He presents today for BP check. He reports compliance with meds. He denies headaches, chest pain and visual disturbances.   BP Readings from Last 3 Encounters: 12/16/22 : 120/70 10/24/22 : (!) 146/70 09/19/22 : (!) 132/90    Hypertension This is a chronic problem. The current episode started more than 1 year ago. The problem has been gradually improving since onset. The problem is controlled. Pertinent negatives include no blurred vision or shortness of breath. Risk factors for coronary artery disease include dyslipidemia and male gender. Past treatments include angiotensin blockers. Hypertensive end-organ damage includes kidney disease. Identifiable causes of hypertension include chronic renal disease.  Hyperlipidemia This is a chronic problem. The current episode started more than 1 year ago. Exacerbating diseases include chronic renal disease. Pertinent negatives include no shortness of breath. Current antihyperlipidemic treatment includes statins. The current treatment provides moderate improvement of lipids. Compliance problems include adherence to exercise.      Past Medical History:  Diagnosis Date   High cholesterol      Family History  Problem Relation Age of Onset   Heart disease Mother    Lung cancer Father    Asthma Sister    Heart disease Brother    Heart disease Brother    Allergic rhinitis Neg Hx    Eczema Neg Hx    Urticaria Neg Hx      Current Outpatient Medications:    acetaminophen (TYLENOL) 325 MG tablet, Take 650 mg by mouth every 6 (six) hours as needed., Disp: , Rfl:     amLODipine (NORVASC) 5 MG tablet, Take 1 tablet (5 mg total) by mouth every evening., Disp: 90 tablet, Rfl: 3   atorvastatin (LIPITOR) 10 MG tablet, Take 1 tablet (10 mg total) by mouth daily., Disp: 90 tablet, Rfl: 2   mupirocin ointment (BACTROBAN) 2 %, Apply 1 Application topically 2 (two) times daily., Disp: 30 g, Rfl: 0   nebivolol (BYSTOLIC) 10 MG tablet, Take 1 tablet (10 mg total) by mouth every morning., Disp: 90 tablet, Rfl: 2   Nutritional Supplements (EQUATE) LIQD, Take by mouth daily., Disp: , Rfl:    olmesartan (BENICAR) 20 MG tablet, Take 1 tablet (20 mg total) by mouth every morning., Disp: 90 tablet, Rfl: 2   No Known Allergies   Review of Systems  Constitutional:  Positive for appetite change and unexpected weight change.       He c/o poor appetite and weight loss.  He states he doesn't like to eat as much. He is a caregiver for his wife.   HENT: Negative.    Eyes: Negative.  Negative for blurred vision.  Respiratory: Negative.  Negative for shortness of breath.   Cardiovascular: Negative.   Gastrointestinal: Negative.   Musculoskeletal: Negative.   Skin: Negative.   Psychiatric/Behavioral: Negative.       Today's Vitals   12/16/22 1124  BP: 120/70  Pulse: 60  Temp: 98.6 F (37 C)  TempSrc: Oral  Weight: 146 lb (66.2 kg)  Height: 5\' 10"  (1.778 m)  PainSc: 0-No pain   Body  mass index is 20.95 kg/m.  Wt Readings from Last 3 Encounters:  12/16/22 146 lb (66.2 kg)  10/24/22 146 lb (66.2 kg)  09/19/22 149 lb 9.6 oz (67.9 kg)    Objective:  Physical Exam Vitals and nursing note reviewed.  Constitutional:      Appearance: Normal appearance.  Eyes:     Extraocular Movements: Extraocular movements intact.  Cardiovascular:     Rate and Rhythm: Normal rate and regular rhythm.     Heart sounds: Normal heart sounds.  Pulmonary:     Effort: Pulmonary effort is normal.     Breath sounds: Normal breath sounds.  Musculoskeletal:     Cervical back: Normal  range of motion.     Comments: Ambulatory with walker  Skin:    General: Skin is warm.  Neurological:     General: No focal deficit present.     Mental Status: He is alert.  Psychiatric:        Mood and Affect: Mood normal.         Assessment And Plan:  Hypertensive heart and renal disease with renal failure, stage 1 through stage 4 or unspecified chronic kidney disease, without heart failure Assessment & Plan: Chronic, fair control. Goal BP<130/80. No med changes today. Encouraged to follow low sodium diet. He will f/u in 6 months for re-evaluation.   Orders: -     Lipid panel -     CMP14+EGFR  Chronic renal disease, stage II Assessment & Plan: Chronic, I will check renal function today. He is encouraged to keep BP controlled and stay well hydrated to prevent progression of CKD.    Peripheral artery disease (HCC) Assessment & Plan: Chronic, encouraged to increase daily activity. He is reminded chair exercises count as well.  He is on statin therapy. Cardiology input is appreciated.    Pure hypercholesterolemia Assessment & Plan: Chronic, he will continue with atorvastatin 10mg  daily. LDL goal < 70.    Orders: -     Lipid panel -     CMP14+EGFR  Other decreased white blood cell (WBC) count Assessment & Plan: Last WBC 2.7. I will recheck a CBC today.    Unintentional weight loss Assessment & Plan: Thyroid function was normal in January. I will recheck today. He admits to decreased appetite. He does also admit to early satiety. Declined DRE/GI eval. He was given stool cards to complete as well.   Orders: -     CBC -     CMP14+EGFR -     TSH  Caregiver stress Assessment & Plan: He declines SW evaluation to discuss caregiver stress. He prefers to have a discussion with his wife.      Return for 6 month bp check.   Patient was given opportunity to ask questions. Patient verbalized understanding of the plan and was able to repeat key elements of the plan. All  questions were answered to their satisfaction.    I, Gwynneth Aliment, MD, have reviewed all documentation for this visit. The documentation on 12/16/22 for the exam, diagnosis, procedures, and orders are all accurate and complete.   IF YOU HAVE BEEN REFERRED TO A SPECIALIST, IT MAY TAKE 1-2 WEEKS TO SCHEDULE/PROCESS THE REFERRAL. IF YOU HAVE NOT HEARD FROM US/SPECIALIST IN TWO WEEKS, PLEASE GIVE [place] A CALL AT [phone].

## 2022-12-16 NOTE — Assessment & Plan Note (Addendum)
Thyroid function was normal in January. I will recheck today. He admits to decreased appetite. He does also admit to early satiety. Declined DRE/GI eval. He was given stool cards to complete as well.

## 2022-12-16 NOTE — Assessment & Plan Note (Signed)
Chronic, he will continue with atorvastatin 10mg  daily. LDL goal < 70.

## 2022-12-16 NOTE — Patient Instructions (Signed)
High-Protein and High-Calorie Diet Eating high-protein and high-calorie foods can help you to gain weight, heal after an injury, and recover after an illness or surgery. The specific amount of daily protein and calories you need depends on: Your body weight. The reason this diet is recommended for you. Generally, a high-protein, high-calorie diet involves: Eating 250-500 extra calories each day. Making sure that you get enough of your daily calories from protein. Ask your health care provider how many of your calories should come from protein. Talk with a health care provider or a dietitian about how much protein and how many calories you need each day. Follow the diet as directed by your health care provider. What are tips for following this plan?  Reading food labels Check the nutrition facts label for calories, grams of fat and protein. Items with more than 4 grams of protein are high-protein foods. Preparing meals Add whole milk, half-and-half, or heavy cream to cereal, pudding, soup, or hot cocoa. Add whole milk to instant breakfast drinks. Add peanut butter to oatmeal or smoothies. Add powdered milk to baked goods, smoothies, or milkshakes. Add powdered milk, cream, or butter to mashed potatoes. Add cheese to cooked vegetables. Make whole-milk yogurt parfaits. Top them with granola, fruit, or nuts. Add cottage cheese to fruit. Add avocado, cheese, or both to sandwiches or salads. Add avocado to smoothies. Add meat, poultry, or seafood to rice, pasta, casseroles, salads, and soups. Use mayonnaise when making egg salad, chicken salad, or tuna salad. Use peanut butter as a dip for fruits and vegetables or as a topping for pretzels, celery, or crackers. Add beans to casseroles, dips, and spreads. Add pureed beans to sauces and soups. Replace calorie-free drinks with calorie-containing drinks, such as milk and fruit juice. Replace water with milk or heavy cream when making foods such as  oatmeal, pudding, or cocoa. Add oil or butter to cooked vegetables and grains. Add cream cheese to sandwiches or as a topping on crackers and bread. Make cream-based pastas and soups. General information Ask your health care provider if you should take a nutritional supplement. Try to eat six small meals each day instead of three large meals. A general goal is to eat every 2 to 3 hours. Eat a balanced diet. In each meal, include one food that is high in protein and one food with fat in it. Keep nutritious snacks available, such as nuts, trail mixes, dried fruit, and yogurt. If you have kidney disease or diabetes, talk with your health care provider about how much protein is safe for you. Too much protein may put extra stress on your kidneys. Drink your calories. Choose high-calorie drinks and have them after your meals. Consider setting a timer to remind you to eat. You will want to eat even if you do not feel very hungry. What high-protein foods should I eat?  Vegetables Soybeans. Peas. Grains Quinoa. Bulgur wheat. Buckwheat. Meats and other proteins Beef, pork, and poultry. Fish and seafood. Eggs. Tofu. Textured vegetable protein (TVP). Peanut butter. Nuts and seeds. Dried beans. Protein powders. Hummus. Dairy Whole milk. Whole-milk yogurt. Powdered milk. Cheese. Cottage Cheese. Eggnog. Beverages High-protein supplement drinks. Soy milk. Other foods Protein bars. The items listed above may not be a complete list of foods and beverages you can eat and drink. Contact a dietitian for more information. What high-calorie foods should I eat? Fruits Dried fruit. Fruit leather. Canned fruit in syrup. Fruit juice. Avocado. Vegetables Vegetables cooked in oil or butter. Fried potatoes. Grains   Pasta. Quick breads. Muffins. Pancakes. Ready-to-eat cereal. Meats and other proteins Peanut butter. Nuts and seeds. Dairy Heavy cream. Whipped cream. Cream cheese. Sour cream. Ice cream. Custard.  Pudding. Whole milk dairy products. Beverages Meal-replacement beverages. Nutrition shakes. Fruit juice. Seasonings and condiments Salad dressing. Mayonnaise. Alfredo sauce. Fruit preserves or jelly. Honey. Syrup. Sweets and desserts Cake. Cookies. Pie. Pastries. Candy bars. Chocolate. Fats and oils Butter or margarine. Oil. Gravy. Other foods Meal-replacement bars. The items listed above may not be a complete list of foods and beverages you can eat and drink. Contact a dietitian for more information. Summary A high-protein, high-calorie diet can help you gain weight or heal faster after an injury, illness, or surgery. To increase your protein and calories, add ingredients such as whole milk, peanut butter, cheese, beans, meat, or seafood to meal items. To get enough extra calories each day, include high-calorie foods and beverages at each meal. Adding a high-calorie drink or shake can be an easy way to help you get enough calories each day. Talk with your healthcare provider or dietitian about the best options for you. This information is not intended to replace advice given to you by your health care provider. Make sure you discuss any questions you have with your health care provider. Document Revised: 04/07/2020 Document Reviewed: 04/09/2020 Elsevier Patient Education  2024 Elsevier Inc.  

## 2022-12-16 NOTE — Assessment & Plan Note (Signed)
Last WBC 2.7. I will recheck a CBC today.

## 2022-12-30 ENCOUNTER — Telehealth: Payer: Self-pay | Admitting: *Deleted

## 2022-12-30 ENCOUNTER — Telehealth: Payer: Medicare Other

## 2022-12-30 ENCOUNTER — Other Ambulatory Visit: Payer: Medicare Other | Admitting: *Deleted

## 2022-12-30 NOTE — Patient Outreach (Signed)
   Care Management RN Visit Note   12/30/22 Name: Chad Miller MRN: [id number]      DOB: July 09, 1940  Subjective: Chad Miller is a 82 y.o. year old male who is a primary care patient of [name] MD.   Unsuccessful outreach attempt for telephone assessment.  Plan: will outreach upon care guide rescheduling.  Irving Shows [facility], BSN / Ambulatory Care Management [phone]

## 2023-01-10 ENCOUNTER — Other Ambulatory Visit: Payer: Medicare Other | Admitting: *Deleted

## 2023-01-10 ENCOUNTER — Telehealth: Payer: Self-pay | Admitting: *Deleted

## 2023-01-10 NOTE — Patient Outreach (Signed)
  Care Management   Follow Up Note   01/10/2023 Name: Colm Herpel MRN: [id number] DOB: [birth year]   Referred by: [name], MD Reason for referral : Care Coordination   A second unsuccessful telephone outreach was attempted today. The patient was referred to the case management team for assistance with care management and care coordination.   Follow Up Plan: Telephone follow up appointment with care management team member scheduled for: upon care guide rescheduling.  Irving Shows [facility], BSN [place]/ Ambulatory Care Management [phone]

## 2023-01-23 ENCOUNTER — Ambulatory Visit: Payer: Medicare Other

## 2023-01-23 DIAGNOSIS — Z23 Encounter for immunization: Secondary | ICD-10-CM | POA: Diagnosis not present

## 2023-01-23 NOTE — Progress Notes (Signed)
I,Ahmira Boisselle T Jaxxen Voong, CMA,acting as a Neurosurgeon for [employer] documented all relevant documentation on the behalf of TIMA-NURSE,as directed by  [facility] while in the presence of TIMA-NURSE.  Subjective:  Patient ID: Chad Miller , male    DOB: [birth year] , 82 y.o.   MRN: [id number]  No chief complaint on file.   HPI  HPI   Past Medical History:  Diagnosis Date   High cholesterol      Family History  Problem Relation Age of Onset   Heart disease Mother    Lung cancer Father    Asthma Sister    Heart disease Brother    Heart disease Brother    Allergic rhinitis Neg Hx    Eczema Neg Hx    Urticaria Neg Hx      Current Outpatient Medications:    acetaminophen (TYLENOL) 325 MG tablet, Take 650 mg by mouth every 6 (six) hours as needed., Disp: , Rfl:    amLODipine (NORVASC) 5 MG tablet, Take 1 tablet (5 mg total) by mouth every evening., Disp: 90 tablet, Rfl: 3   atorvastatin (LIPITOR) 10 MG tablet, Take 1 tablet (10 mg total) by mouth daily., Disp: 90 tablet, Rfl: 2   mupirocin ointment (BACTROBAN) 2 %, Apply 1 Application topically 2 (two) times daily., Disp: 30 g, Rfl: 0   nebivolol (BYSTOLIC) 10 MG tablet, Take 1 tablet (10 mg total) by mouth every morning., Disp: 90 tablet, Rfl: 2   Nutritional Supplements (EQUATE) LIQD, Take by mouth daily., Disp: , Rfl:    olmesartan (BENICAR) 20 MG tablet, Take 1 tablet (20 mg total) by mouth every morning., Disp: 90 tablet, Rfl: 2   No Known Allergies   Review of Systems   There were no vitals filed for this visit. There is no height or weight on file to calculate BMI.  Wt Readings from Last 3 Encounters:  12/16/22 146 lb (66.2 kg)  10/24/22 146 lb (66.2 kg)  09/19/22 149 lb 9.6 oz (67.9 kg)     Objective:  Physical Exam      Assessment And Plan:  There are no diagnoses linked to this encounter.   No follow-ups on file.  Patient was given opportunity to ask questions. Patient verbalized understanding of the plan  and was able to repeat key elements of the plan. All questions were answered to their satisfaction.  TIMA-NURSE  I, TIMA-NURSE, have reviewed all documentation for this visit. The documentation on 01/23/23 for the exam, diagnosis, procedures, and orders are all accurate and complete.   IF YOU HAVE BEEN REFERRED TO A SPECIALIST, IT MAY TAKE 1-2 WEEKS TO SCHEDULE/PROCESS THE REFERRAL. IF YOU HAVE NOT HEARD FROM US/SPECIALIST IN TWO WEEKS, PLEASE GIVE [place] A CALL AT [phone].   THE PATIENT IS ENCOURAGED TO PRACTICE SOCIAL DISTANCING DUE TO THE COVID-19 PANDEMIC.

## 2023-01-30 ENCOUNTER — Other Ambulatory Visit: Payer: Medicare Other | Admitting: *Deleted

## 2023-01-30 ENCOUNTER — Encounter: Payer: Self-pay | Admitting: *Deleted

## 2023-01-30 NOTE — Patient Instructions (Signed)
Visit Information  Thank you for taking time to visit with me today. Please don't hesitate to contact me if I can be of assistance to you before our next scheduled telephone appointment.  Following are the goals we discussed today:   Goals Addressed             This Visit's Progress    COMPLETED: CCM (HYPERLIPIDEMIA) EXPECTED OUTCOME: MONITOR, SELF-MANAGE AND REDUCE SYMPTOMS OF HYPERLIPIDEMIA       Current Barriers:  Knowledge Deficits related to Hyperlipidemia management Chronic Disease Management support and education needs related to Hyperlipidemia and diet Patient reports he has all medications and taking as prescribed, does stretching at least 5 days per week Patient reports his spouse did come home from short term rehab and he has been helping her with recovery No new concerns reported  Planned Interventions: Provider established cholesterol goals reviewed; Counseled on importance of regular laboratory monitoring as prescribed; Reviewed role and benefits of statin for ASCVD risk reduction; Reviewed importance of limiting foods high in cholesterol; Reviewed exercise goals and target of 150 minutes per week; Reinforced heart healthy diet and importance of healthy, nutritious food choices Reviewed upcoming scheduled appointments-  primary care provider visit January 2025 Reviewed plan of care with patient including case closure  Symptom Management: Take medications as prescribed   Attend all scheduled provider appointments Call pharmacy for medication refills 3-7 days in advance of running out of medications Perform all self care activities independently  Perform IADL's (shopping, preparing meals, housekeeping, managing finances) independently Call provider office for new concerns or questions  - call for medicine refill 2 or 3 days before it runs out - take all medications exactly as prescribed - call doctor with any symptoms you believe are related to your medicine - call  doctor when you experience any new symptoms - go to all doctor appointments as scheduled - adhere to prescribed diet:  heart healthy diet - develop an exercise routine Bake and broil foods instead of frying Read food labels for sodium, carbohydrate and fat content Case closure  Follow Up Plan: No further follow up required: case closure           COMPLETED: CCM (HYPERTENSION) EXPECTED OUTCOME: MONITOR, SELF-MANAGE AND REDUCE SYMPTOMS OF HYPERTENSION       Current Barriers:  Knowledge Deficits related to Hypertension management Chronic Disease Management support and education needs related to Hypertension, diet Patient reports he lives with spouse and adult son, is overall independent with all aspects of his care, continues to drive, checks blood pressure on occasion, adult son assists as needed.  Planned Interventions: Evaluation of current treatment plan related to hypertension self management and patient's adherence to plan as established by provider;   Reviewed medications with patient and discussed importance of compliance;  Counseled on the importance of exercise goals with target of 150 minutes per week Advised patient, providing education and rationale, to monitor blood pressure daily and record, calling PCP for findings outside established parameters;  Advised patient to discuss any issues with blood pressure with provider; Discussed complications of poorly controlled blood pressure such as heart disease, stroke, circulatory complications, vision complications, kidney impairment, sexual dysfunction;  Reinforced low sodium diet and importance of reading labels  Symptom Management: Take medications as prescribed   Attend all scheduled provider appointments Call pharmacy for medication refills 3-7 days in advance of running out of medications Perform all self care activities independently  Perform IADL's (shopping, preparing meals, housekeeping, managing finances)  independently  Call provider office for new concerns or questions  check blood pressure daily choose a place to take my blood pressure (home, clinic or office, retail store) write blood pressure results in a log or diary learn about high blood pressure keep a blood pressure log take blood pressure log to all doctor appointments call doctor for signs and symptoms of high blood pressure keep all doctor appointments take medications for blood pressure exactly as prescribed begin an exercise program report new symptoms to your doctor eat more whole grains, fruits and vegetables, lean meats and healthy fats Follow low sodium diet Read food labels for sodium content Limit fast food  Follow Up Plan: No further follow up required: case closure              Please call the care guide team at [phone] if you need to cancel or reschedule your appointment.   If you are experiencing a Mental Health or Behavioral Health Crisis or need someone to talk to, please call the Suicide and Crisis Lifeline: 988 call the [place] National Suicide Prevention Lifeline: 930-557-2928 or TTY: 346-485-7851 TTY 469-292-2200) to talk to a trained counselor call 1-800-273-TALK (toll free, 24 hour hotline) go to [facility] [address], [place] [phone]) call 911   Patient verbalizes understanding of instructions and care plan provided today and agrees to view in MyChart. Active MyChart status and patient understanding of how to access instructions and care plan via MyChart confirmed with patient.     No further follow up required: case closure  Irving Shows [facility], BSN [place]/ Ambulatory Care Management [phone]

## 2023-01-30 NOTE — Patient Outreach (Signed)
Care Management   Visit Note  01/30/2023 Name: Chad Miller MRN: [id number] DOB: 02-23-1941  Subjective: Chad Miller is a 82 y.o. year old male who is a primary care patient of [name], MD. The Care Management team was consulted for assistance.      Engaged with patient spoke with patient by telephone for follow up   Goals Addressed             This Visit's Progress    COMPLETED: CCM (HYPERLIPIDEMIA) EXPECTED OUTCOME: MONITOR, SELF-MANAGE AND REDUCE SYMPTOMS OF HYPERLIPIDEMIA       Current Barriers:  Knowledge Deficits related to Hyperlipidemia management Chronic Disease Management support and education needs related to Hyperlipidemia and diet Patient reports he has all medications and taking as prescribed, does stretching at least 5 days per week Patient reports his spouse did come home from short term rehab and he has been helping her with recovery No new concerns reported  Planned Interventions: Provider established cholesterol goals reviewed; Counseled on importance of regular laboratory monitoring as prescribed; Reviewed role and benefits of statin for ASCVD risk reduction; Reviewed importance of limiting foods high in cholesterol; Reviewed exercise goals and target of 150 minutes per week; Reinforced heart healthy diet and importance of healthy, nutritious food choices Reviewed upcoming scheduled appointments-  primary care provider visit January 2025 Reviewed plan of care with patient including case closure  Symptom Management: Take medications as prescribed   Attend all scheduled provider appointments Call pharmacy for medication refills 3-7 days in advance of running out of medications Perform all self care activities independently  Perform IADL's (shopping, preparing meals, housekeeping, managing finances) independently Call provider office for new concerns or questions  - call for medicine refill 2 or 3 days before it runs out - take all medications  exactly as prescribed - call doctor with any symptoms you believe are related to your medicine - call doctor when you experience any new symptoms - go to all doctor appointments as scheduled - adhere to prescribed diet:  heart healthy diet - develop an exercise routine Bake and broil foods instead of frying Read food labels for sodium, carbohydrate and fat content Case closure  Follow Up Plan: No further follow up required: case closure           COMPLETED: CCM (HYPERTENSION) EXPECTED OUTCOME: MONITOR, SELF-MANAGE AND REDUCE SYMPTOMS OF HYPERTENSION       Current Barriers:  Knowledge Deficits related to Hypertension management Chronic Disease Management support and education needs related to Hypertension, diet Patient reports he lives with spouse and adult son, is overall independent with all aspects of his care, continues to drive, checks blood pressure on occasion, adult son assists as needed.  Planned Interventions: Evaluation of current treatment plan related to hypertension self management and patient's adherence to plan as established by provider;   Reviewed medications with patient and discussed importance of compliance;  Counseled on the importance of exercise goals with target of 150 minutes per week Advised patient, providing education and rationale, to monitor blood pressure daily and record, calling PCP for findings outside established parameters;  Advised patient to discuss any issues with blood pressure with provider; Discussed complications of poorly controlled blood pressure such as heart disease, stroke, circulatory complications, vision complications, kidney impairment, sexual dysfunction;  Reinforced low sodium diet and importance of reading labels  Symptom Management: Take medications as prescribed   Attend all scheduled provider appointments Call pharmacy for medication refills 3-7 days in advance of running out  of medications Perform all self care activities  independently  Perform IADL's (shopping, preparing meals, housekeeping, managing finances) independently Call provider office for new concerns or questions  check blood pressure daily choose a place to take my blood pressure (home, clinic or office, retail store) write blood pressure results in a log or diary learn about high blood pressure keep a blood pressure log take blood pressure log to all doctor appointments call doctor for signs and symptoms of high blood pressure keep all doctor appointments take medications for blood pressure exactly as prescribed begin an exercise program report new symptoms to your doctor eat more whole grains, fruits and vegetables, lean meats and healthy fats Follow low sodium diet Read food labels for sodium content Limit fast food  Follow Up Plan: No further follow up required: case closure            Case closure  Irving Shows [facility], BSN [facility]/ Ambulatory Care Management [phone]

## 2023-03-04 ENCOUNTER — Other Ambulatory Visit: Payer: Self-pay | Admitting: Internal Medicine

## 2023-03-04 DIAGNOSIS — I129 Hypertensive chronic kidney disease with stage 1 through stage 4 chronic kidney disease, or unspecified chronic kidney disease: Secondary | ICD-10-CM

## 2023-05-17 ENCOUNTER — Other Ambulatory Visit: Payer: Self-pay | Admitting: Internal Medicine

## 2023-05-17 DIAGNOSIS — I129 Hypertensive chronic kidney disease with stage 1 through stage 4 chronic kidney disease, or unspecified chronic kidney disease: Secondary | ICD-10-CM

## 2023-06-18 ENCOUNTER — Encounter: Payer: Self-pay | Admitting: Internal Medicine

## 2023-06-18 ENCOUNTER — Ambulatory Visit (INDEPENDENT_AMBULATORY_CARE_PROVIDER_SITE_OTHER): Payer: Medicare Other | Admitting: Internal Medicine

## 2023-06-18 VITALS — BP 134/72 | HR 52 | Temp 97.5°F | Ht 70.0 in | Wt 147.0 lb

## 2023-06-18 DIAGNOSIS — R6889 Other general symptoms and signs: Secondary | ICD-10-CM

## 2023-06-18 DIAGNOSIS — I1310 Hypertensive heart and chronic kidney disease without heart failure, with stage 1 through stage 4 chronic kidney disease, or unspecified chronic kidney disease: Secondary | ICD-10-CM

## 2023-06-18 DIAGNOSIS — R634 Abnormal weight loss: Secondary | ICD-10-CM

## 2023-06-18 DIAGNOSIS — N182 Chronic kidney disease, stage 2 (mild): Secondary | ICD-10-CM

## 2023-06-18 DIAGNOSIS — Z9989 Dependence on other enabling machines and devices: Secondary | ICD-10-CM | POA: Diagnosis not present

## 2023-06-18 DIAGNOSIS — I739 Peripheral vascular disease, unspecified: Secondary | ICD-10-CM

## 2023-06-18 DIAGNOSIS — R7309 Other abnormal glucose: Secondary | ICD-10-CM | POA: Insufficient documentation

## 2023-06-18 DIAGNOSIS — D72819 Decreased white blood cell count, unspecified: Secondary | ICD-10-CM

## 2023-06-18 DIAGNOSIS — E7800 Pure hypercholesterolemia, unspecified: Secondary | ICD-10-CM | POA: Diagnosis not present

## 2023-06-18 MED ORDER — NEBIVOLOL HCL 10 MG PO TABS: 10.0000 mg | ORAL_TABLET | Freq: Every day | ORAL | 2 refills | Status: DC

## 2023-06-18 MED ORDER — OLMESARTAN MEDOXOMIL 20 MG PO TABS: 20.0000 mg | ORAL_TABLET | Freq: Every morning | ORAL | 2 refills | Status: DC

## 2023-06-18 NOTE — Assessment & Plan Note (Signed)
Chronic, fair control. Goal BP<130/80. No med changes today. He will continue with nebivolol and olmesartan in the mornings. He will continue with amlodipine in the evenings.  Encouraged to follow low sodium diet. He will f/u in 6 months for re-evaluation.

## 2023-06-18 NOTE — Assessment & Plan Note (Signed)
He agrees home thermostat may be too high.  He will lower by 2 degrees (his wife keeps it up high).

## 2023-06-18 NOTE — Assessment & Plan Note (Signed)
Chronic, he will continue with atorvastatin 10mg  daily. LDL goal < 70.  He is encouraged to follow heart healthy lifestyle.

## 2023-06-18 NOTE — Assessment & Plan Note (Signed)
Chronic, I will check renal function today. He is encouraged to keep BP controlled and stay well hydrated to prevent progression of CKD.

## 2023-06-18 NOTE — Progress Notes (Signed)
I,Victoria T Deloria Lair, CMA,acting as a Neurosurgeon for Gwynneth Aliment, MD.,have documented all relevant documentation on the behalf of Gwynneth Aliment, MD,as directed by  Gwynneth Aliment, MD while in the presence of Gwynneth Aliment, MD.  Subjective:  Patient ID: Chad Miller , male    DOB: 04-04-1941 , 83 y.o.   MRN: [id number]  Chief Complaint  Patient presents with   Hypertension   Hyperlipidemia    HPI  He presents today for BP & cholesterol check. He reports compliance with meds. He denies headaches, chest pain and visual disturbances.  He complains at times feeling " over heated" then suddenly feeling cold. He noticed this after receiving a flu shot on 01/23/23.        Hypertension This is a chronic problem. The current episode started more than 1 year ago. The problem has been gradually improving since onset. The problem is controlled. Pertinent negatives include no blurred vision or shortness of breath. Risk factors for coronary artery disease include dyslipidemia and male gender. Past treatments include angiotensin blockers. Hypertensive end-organ damage includes kidney disease. Identifiable causes of hypertension include chronic renal disease.  Hyperlipidemia This is a chronic problem. The current episode started more than 1 year ago. Exacerbating diseases include chronic renal disease. Pertinent negatives include no shortness of breath. Current antihyperlipidemic treatment includes statins. The current treatment provides moderate improvement of lipids. Compliance problems include adherence to exercise.      Past Medical History:  Diagnosis Date   High cholesterol      Family History  Problem Relation Age of Onset   Heart disease Mother    Lung cancer Father    Asthma Sister    Heart disease Brother    Heart disease Brother    Allergic rhinitis Neg Hx    Eczema Neg Hx    Urticaria Neg Hx      Current Outpatient Medications:    acetaminophen (TYLENOL) 325 MG  tablet, Take 650 mg by mouth every 6 (six) hours as needed., Disp: , Rfl:    amLODipine (NORVASC) 5 MG tablet, TAKE 1 TABLET EVERY EVENING, Disp: 90 tablet, Rfl: 3   atorvastatin (LIPITOR) 10 MG tablet, TAKE 1 TABLET DAILY, Disp: 90 tablet, Rfl: 2   mupirocin ointment (BACTROBAN) 2 %, Apply 1 Application topically 2 (two) times daily., Disp: 30 g, Rfl: 0   Nutritional Supplements (EQUATE) LIQD, Take by mouth daily., Disp: , Rfl:    nebivolol (BYSTOLIC) 10 MG tablet, Take 1 tablet (10 mg total) by mouth daily. TAKE 1 TABLET EVERY        MORNING. DISCONTINUE       METOPROLOL, Disp: 90 tablet, Rfl: 2   olmesartan (BENICAR) 20 MG tablet, Take 1 tablet (20 mg total) by mouth every morning., Disp: 90 tablet, Rfl: 2   No Known Allergies   Review of Systems  Constitutional: Negative.   HENT: Negative.    Eyes:  Negative for blurred vision.  Respiratory: Negative.  Negative for shortness of breath.   Cardiovascular: Negative.   Gastrointestinal: Negative.   Skin: Negative.   Allergic/Immunologic: Negative.   Neurological: Negative.   Hematological: Negative.      Today's Vitals   06/18/23 1133  BP: 134/72  Pulse: (!) 52  Temp: (!) 97.5 F (36.4 C)  SpO2: 98%  Weight: 147 lb (66.7 kg)  Height: 5\' 10"  (1.778 m)   Body mass index is 21.09 kg/m.  Wt Readings from Last 3 Encounters:  06/18/23 147 lb (  66.7 kg)  12/16/22 146 lb (66.2 kg)  10/24/22 146 lb (66.2 kg)    BP Readings from Last 3 Encounters:  06/18/23 134/72  12/16/22 120/70  10/24/22 (!) 146/70     Objective:  Physical Exam Vitals and nursing note reviewed.  Constitutional:      Appearance: Normal appearance.  HENT:     Head: Normocephalic and atraumatic.  Eyes:     Extraocular Movements: Extraocular movements intact.  Cardiovascular:     Rate and Rhythm: Normal rate and regular rhythm.     Heart sounds: Normal heart sounds.  Pulmonary:     Effort: Pulmonary effort is normal.     Breath sounds: Normal breath  sounds.  Musculoskeletal:     Cervical back: Normal range of motion.     Comments: Ambulatory with walker  Skin:    General: Skin is warm.  Neurological:     General: No focal deficit present.     Mental Status: He is alert.  Psychiatric:        Mood and Affect: Mood normal.         Assessment And Plan:  Hypertensive heart and renal disease with renal failure, stage 1 through stage 4 or unspecified chronic kidney disease, without heart failure Assessment & Plan: Chronic, fair control. Goal BP<130/80. No med changes today. He will continue with nebivolol and olmesartan in the mornings. He will continue with amlodipine in the evenings.  Encouraged to follow low sodium diet. He will f/u in 6 months for re-evaluation.   Orders: -     CMP14+EGFR -     Lipid panel -     Nebivolol HCl; Take 1 tablet (10 mg total) by mouth daily. TAKE 1 TABLET EVERY        MORNING. DISCONTINUE       METOPROLOL  Dispense: 90 tablet; Refill: 2 -     Olmesartan Medoxomil; Take 1 tablet (20 mg total) by mouth every morning.  Dispense: 90 tablet; Refill: 2  Chronic renal disease, stage II Assessment & Plan: Chronic, I will check renal function today. He is encouraged to keep BP controlled and stay well hydrated to prevent progression of CKD.   Orders: -     CMP14+EGFR -     CBC  Peripheral artery disease (HCC) Assessment & Plan: Chronic, encouraged to increase daily activity. He is reminded chair exercises count as well.  He is on statin therapy. Cardiology input is appreciated.    Pure hypercholesterolemia Assessment & Plan: Chronic, he will continue with atorvastatin 10mg  daily. LDL goal < 70.  He is encouraged to follow heart healthy lifestyle.    Other abnormal glucose Assessment & Plan: Previous labs reviewed, his A1c has been elevated in the past. I will check an A1c today. Reminded to avoid refined sugars including sugary drinks/foods and processed meats including bacon, sausages and deli  meats.    Orders: -     Hemoglobin A1c  Leukopenia, unspecified type -     CBC  Heat intolerance Assessment & Plan: He agrees home thermostat may be too high.  He will lower by 2 degrees (his wife keeps it up high).       Return in 6 months (on 12/16/2023), or bp check.  Patient was given opportunity to ask questions. Patient verbalized understanding of the plan and was able to repeat key elements of the plan. All questions were answered to their satisfaction.    I, Gwynneth Aliment, MD, have reviewed all  documentation for this visit. The documentation on 06/18/23 for the exam, diagnosis, procedures, and orders are all accurate and complete.   IF YOU HAVE BEEN REFERRED TO A SPECIALIST, IT MAY TAKE 1-2 WEEKS TO SCHEDULE/PROCESS THE REFERRAL. IF YOU HAVE NOT HEARD FROM US/SPECIALIST IN TWO WEEKS, PLEASE GIVE [place] A CALL AT [phone].   THE PATIENT IS ENCOURAGED TO PRACTICE SOCIAL DISTANCING DUE TO THE COVID-19 PANDEMIC.

## 2023-06-18 NOTE — Patient Instructions (Signed)
Hypertension, Adult Hypertension is another name for high blood pressure. High blood pressure forces your heart to work harder to pump blood. This can cause problems over time. There are two numbers in a blood pressure reading. There is a top number (systolic) over a bottom number (diastolic). It is best to have a blood pressure that is below 120/80. What are the causes? The cause of this condition is not known. Some other conditions can lead to high blood pressure. What increases the risk? Some lifestyle factors can make you more likely to develop high blood pressure: Smoking. Not getting enough exercise or physical activity. Being overweight. Having too much fat, sugar, calories, or salt (sodium) in your diet. Drinking too much alcohol. Other risk factors include: Having any of these conditions: Heart disease. Diabetes. High cholesterol. Kidney disease. Obstructive sleep apnea. Having a family history of high blood pressure and high cholesterol. Age. The risk increases with age. Stress. What are the signs or symptoms? High blood pressure may not cause symptoms. Very high blood pressure (hypertensive crisis) may cause: Headache. Fast or uneven heartbeats (palpitations). Shortness of breath. Nosebleed. Vomiting or feeling like you may vomit (nauseous). Changes in how you see. Very bad chest pain. Feeling dizzy. Seizures. How is this treated? This condition is treated by making healthy lifestyle changes, such as: Eating healthy foods. Exercising more. Drinking less alcohol. Your doctor may prescribe medicine if lifestyle changes do not help enough and if: Your top number is above 130. Your bottom number is above 80. Your personal target blood pressure may vary. Follow these instructions at home: Eating and drinking  If told, follow the DASH eating plan. To follow this plan: Fill one half of your plate at each meal with fruits and vegetables. Fill one fourth of your plate  at each meal with whole grains. Whole grains include whole-wheat pasta, brown rice, and whole-grain bread. Eat or drink low-fat dairy products, such as skim milk or low-fat yogurt. Fill one fourth of your plate at each meal with low-fat (lean) proteins. Low-fat proteins include fish, chicken without skin, eggs, beans, and tofu. Avoid fatty meat, cured and processed meat, or chicken with skin. Avoid pre-made or processed food. Limit the amount of salt in your diet to less than 1,500 mg each day. Do not drink alcohol if: Your doctor tells you not to drink. You are pregnant, may be pregnant, or are planning to become pregnant. If you drink alcohol: Limit how much you have to: 0-1 drink a day for women. 0-2 drinks a day for men. Know how much alcohol is in your drink. In the [place] one drink equals one 12 oz bottle of beer (355 mL), one 5 oz glass of wine (148 mL), or one 1 oz glass of hard liquor (44 mL). Lifestyle  Work with your doctor to stay at a healthy weight or to lose weight. Ask your doctor what the best weight is for you. Get at least 30 minutes of exercise that causes your heart to beat faster (aerobic exercise) most days of the week. This may include walking, swimming, or biking. Get at least 30 minutes of exercise that strengthens your muscles (resistance exercise) at least 3 days a week. This may include lifting weights or doing Pilates. Do not smoke or use any products that contain nicotine or tobacco. If you need help quitting, ask your doctor. Check your blood pressure at home as told by your doctor. Keep all follow-up visits. Medicines Take over-the-counter and prescription medicines  only as told by your doctor. Follow directions carefully. Do not skip doses of blood pressure medicine. The medicine does not work as well if you skip doses. Skipping doses also puts you at risk for problems. Ask your doctor about side effects or reactions to medicines that you should watch  for. Contact a doctor if: You think you are having a reaction to the medicine you are taking. You have headaches that keep coming back. You feel dizzy. You have swelling in your ankles. You have trouble with your vision. Get help right away if: You get a very bad headache. You start to feel mixed up (confused). You feel weak or numb. You feel faint. You have very bad pain in your: Chest. Belly (abdomen). You vomit more than once. You have trouble breathing. These symptoms may be an emergency. Get help right away. Call 911. Do not wait to see if the symptoms will go away. Do not drive yourself to the hospital. Summary Hypertension is another name for high blood pressure. High blood pressure forces your heart to work harder to pump blood. For most people, a normal blood pressure is less than 120/80. Making healthy choices can help lower blood pressure. If your blood pressure does not get lower with healthy choices, you may need to take medicine. This information is not intended to replace advice given to you by your health care provider. Make sure you discuss any questions you have with your health care provider. Document Revised: 02/22/2021 Document Reviewed: 02/22/2021 Elsevier Patient Education  2024 [employer].

## 2023-06-18 NOTE — Assessment & Plan Note (Signed)
Chronic, encouraged to increase daily activity. He is reminded chair exercises count as well.  He is on statin therapy. Cardiology input is appreciated.

## 2023-06-18 NOTE — Assessment & Plan Note (Signed)
Previous labs reviewed, his A1c has been elevated in the past. I will check an A1c today. Reminded to avoid refined sugars including sugary drinks/foods and processed meats including bacon, sausages and deli meats.

## 2023-06-19 DIAGNOSIS — N401 Enlarged prostate with lower urinary tract symptoms: Secondary | ICD-10-CM | POA: Diagnosis not present

## 2023-06-19 DIAGNOSIS — R3915 Urgency of urination: Secondary | ICD-10-CM | POA: Diagnosis not present

## 2023-06-19 LAB — CMP14+EGFR
ALT: 21 [IU]/L (ref 0–44)
AST: 21 [IU]/L (ref 0–40)
Albumin: 3.9 g/dL (ref 3.7–4.7)
Alkaline Phosphatase: 80 [IU]/L (ref 44–121)
BUN/Creatinine Ratio: 15 (ref 10–24)
BUN: 13 mg/dL (ref 8–27)
Bilirubin Total: 1 mg/dL (ref 0.0–1.2)
CO2: 25 mmol/L (ref 20–29)
Calcium: 9.4 mg/dL (ref 8.6–10.2)
Chloride: 105 mmol/L (ref 96–106)
Creatinine, Ser: 0.89 mg/dL (ref 0.76–1.27)
Globulin, Total: 3.2 g/dL (ref 1.5–4.5)
Glucose: 83 mg/dL (ref 70–99)
Potassium: 4.8 mmol/L (ref 3.5–5.2)
Sodium: 141 mmol/L (ref 134–144)
Total Protein: 7.1 g/dL (ref 6.0–8.5)
eGFR: 85 mL/min/{1.73_m2} (ref >59)

## 2023-06-19 LAB — LIPID PANEL
Chol/HDL Ratio: 2.5 {ratio} (ref 0.0–5.0)
Cholesterol, Total: 126 mg/dL (ref 100–199)
HDL: 50 mg/dL (ref >39)
LDL Chol Calc (NIH): 65 mg/dL (ref 0–99)
Triglycerides: 44 mg/dL (ref 0–149)
VLDL Cholesterol Cal: 11 mg/dL (ref 5–40)

## 2023-06-19 LAB — CBC
Hematocrit: 39.7 % (ref 37.5–51.0)
Hemoglobin: 13.8 g/dL (ref 13.0–17.7)
MCH: 33.8 pg — ABNORMAL HIGH (ref 26.6–33.0)
MCHC: 34.8 g/dL (ref 31.5–35.7)
MCV: 97 fL (ref 79–97)
Platelets: 190 10*3/uL (ref 150–450)
RBC: 4.08 x10E6/uL — ABNORMAL LOW (ref 4.14–5.80)
RDW: 13.3 % (ref 11.6–15.4)
WBC: 3.1 10*3/uL — ABNORMAL LOW (ref 3.4–10.8)

## 2023-06-19 LAB — HEMOGLOBIN A1C
Est. average glucose Bld gHb Est-mCnc: 117 mg/dL
Hgb A1c MFr Bld: 5.7 % — ABNORMAL HIGH (ref 4.8–5.6)

## 2023-10-29 ENCOUNTER — Ambulatory Visit: Payer: Self-pay | Admitting: *Deleted

## 2023-10-29 NOTE — Telephone Encounter (Signed)
 Copied from CRM [id number]. Topic: Clinical - Red Word Triage >> Oct 29, 2023  1:50 PM Rosaria Common wrote: Red Word that prompted transfer to Nurse Triage: Pain in lower back. Reason for Disposition  Back pain is a chronic symptom (recurrent or ongoing AND present > 4 weeks)  Answer Assessment - Initial Assessment Questions 1. ONSET: When did the pain begin?      I'm having lower back pain.   I've had it for years but it's acting up bad right now. I saw a specialist recently.   I need some medication to help me get through this.  I need to talk with Dr. Elnita Hai. 2. LOCATION: Where does it hurt? (upper, mid or lower back)     Lower back 3. SEVERITY: How bad is the pain?  (e.g., Scale 1-10; mild, moderate, or severe)   - MILD (1-3): Doesn't interfere with normal activities.    - MODERATE (4-7): Interferes with normal activities or awakens from sleep.    - SEVERE (8-10): Excruciating pain, unable to do any normal activities.      Moderate 4. PATTERN: Is the pain constant? (e.g., yes, no; constant, intermittent)      It's flaring up right now 5. RADIATION: Does the pain shoot into your legs or somewhere else?     No 6. CAUSE:  What do you think is causing the back pain?      Chronic pain 7. BACK OVERUSE:  Any recent lifting of heavy objects, strenuous work or exercise?     No 8. MEDICINES: What have you taken so far for the pain? (e.g., nothing, acetaminophen, NSAIDS)     I use regular pain medicine like Tylenol mainly. 9. NEUROLOGIC SYMPTOMS: Do you have any weakness, numbness, or problems with bowel/bladder control?     No 10. OTHER SYMPTOMS: Do you have any other symptoms? (e.g., fever, abdomen pain, burning with urination, blood in urine)       No 11. PREGNANCY: Is there any chance you are pregnant? When was your last menstrual period?       N/A  Protocols used: Back Pain-A-AH FYI Only or Action Required?: FYI only for provider  Patient was last seen in primary  care on 06/18/2023 by Cleave Curling, MD. Called Nurse Triage reporting Back Pain. Symptoms began a week ago. Interventions attempted: OTC medications: Tylenol. Symptoms are: gradually worsening.  Triage Disposition: See PCP Within 2 Weeks  Patient/caregiver understands and will follow disposition?: Yes

## 2023-11-03 ENCOUNTER — Ambulatory Visit: Payer: Self-pay | Admitting: Internal Medicine

## 2023-11-03 ENCOUNTER — Encounter: Payer: Self-pay | Admitting: Internal Medicine

## 2023-11-03 VITALS — BP 118/74 | HR 65 | Temp 98.0°F | Ht 70.0 in | Wt 144.8 lb

## 2023-11-03 DIAGNOSIS — H9190 Unspecified hearing loss, unspecified ear: Secondary | ICD-10-CM | POA: Diagnosis not present

## 2023-11-03 DIAGNOSIS — M48062 Spinal stenosis, lumbar region with neurogenic claudication: Secondary | ICD-10-CM

## 2023-11-03 DIAGNOSIS — M25562 Pain in left knee: Secondary | ICD-10-CM

## 2023-11-03 DIAGNOSIS — Z9989 Dependence on other enabling machines and devices: Secondary | ICD-10-CM

## 2023-11-03 DIAGNOSIS — G8929 Other chronic pain: Secondary | ICD-10-CM

## 2023-11-03 DIAGNOSIS — R296 Repeated falls: Secondary | ICD-10-CM

## 2023-11-03 DIAGNOSIS — I1310 Hypertensive heart and chronic kidney disease without heart failure, with stage 1 through stage 4 chronic kidney disease, or unspecified chronic kidney disease: Secondary | ICD-10-CM | POA: Diagnosis not present

## 2023-11-03 DIAGNOSIS — R7309 Other abnormal glucose: Secondary | ICD-10-CM

## 2023-11-03 DIAGNOSIS — Z79899 Other long term (current) drug therapy: Secondary | ICD-10-CM

## 2023-11-03 DIAGNOSIS — N182 Chronic kidney disease, stage 2 (mild): Secondary | ICD-10-CM

## 2023-11-03 DIAGNOSIS — R2689 Other abnormalities of gait and mobility: Secondary | ICD-10-CM

## 2023-11-03 NOTE — Patient Instructions (Addendum)
 Chronic Back Pain Chronic back pain is back pain that lasts longer than 3 months. The pain may get worse at certain times (flare-ups). There are things you can do at home to manage your pain. Follow these instructions at home: Watch for any changes in your symptoms. Take these actions to help with your pain: Managing pain and stiffness     If told, put ice on the painful area. You may be told to use ice for 24-48 hours after a flare-up starts. Put ice in a plastic bag. Place a towel between your skin and the bag. Leave the ice on for 20 minutes, 2-3 times a day. If told, put heat on the painful area. Do this as often as told by your doctor. Use the heat source that your doctor recommends, such as a moist heat pack or a heating pad. Place a towel between your skin and the heat source. Leave the heat on for 20-30 minutes. If your skin turns bright red, take off the ice or heat right away to prevent skin damage. The risk of damage is higher if you cannot feel pain, heat, or cold. Soak in a warm bath. This can help with pain. Activity        Avoid bending and other activities that make the pain worse. When you stand: Keep your upper back and neck straight. Keep your shoulders pulled back. Avoid slouching. When you sit: Keep your back straight. Relax your shoulders. Do not round your shoulders or pull them backward. Do not sit or stand in one place for too long. Take short rest breaks during the day. Lying down or standing is often better than sitting. Resting can help relieve pain. When sitting or lying down for a long time, do some mild activity or stretching. This will help to prevent stiffness and pain. Get regular exercise. Ask your doctor what activities are safe for you. You may have to avoid lifting. Ask your provider how much you can safely lift. If you lift things: Bend your knees. Keep the weight close to your body. Avoid twisting. Medicines Take over-the-counter and  prescription medicines only as told by your doctor. You may need to take medicines for pain and swelling. These may be taken by mouth or put on the skin. You may also be given muscle relaxants. Ask your doctor if the medicine prescribed to you: Requires you to avoid driving or using machinery. Can cause trouble pooping (constipation). You may need to take these actions to prevent or treat trouble pooping: Drink enough fluid to keep your pee (urine) pale yellow. Take over-the-counter or prescription medicines. Eat foods that are high in fiber. These include beans, whole grains, and fresh fruits and vegetables. Limit foods that are high in fat and sugars. These include fried or sweet foods. General instructions  Sleep on a firm mattress. Try lying on your side with your knees slightly bent. If you lie on your back, put a pillow under your knees. Do not smoke or use any products that contain nicotine or tobacco. If you need help quitting, ask your doctor. Contact a doctor if: Your pain does not get better with rest or medicine. You have new pain. You have a fever. You lose weight quickly. You have trouble doing your normal activities. One or both of your legs or feet feel weak. One or both of your legs or feet lose feeling (have numbness). Get help right away if: You are not able to control when you pee  or poop. You have bad back pain and: You feel like you may vomit (nauseous). You vomit. You have pain in your chest or your belly (abdomen). You have shortness of breath. You faint. These symptoms may be an emergency. Get help right away. Call 911. Do not wait to see if the symptoms will go away. Do not drive yourself to the hospital. This information is not intended to replace advice given to you by your health care provider. Make sure you discuss any questions you have with your health care provider. Document Revised: 12/24/2021 Document Reviewed: 12/24/2021 Elsevier Patient Education   2024 [employer].

## 2023-11-03 NOTE — Progress Notes (Signed)
 I,Victoria T Emmitt, CMA,acting as a neurosurgeon for Catheryn LOISE Slocumb, MD.,have documented all relevant documentation on the behalf of Catheryn LOISE Slocumb, MD,as directed by  Catheryn LOISE Slocumb, MD while in the presence of Catheryn LOISE Slocumb, MD.  Subjective:  Patient ID: Chad Miller , male    DOB: Oct 06, 1940 , 83 y.o.   MRN: [id number]  Chief Complaint  Patient presents with   Back Pain    Patient presents today for lower back pain & left knee pain. He is accompanied by his son today. Lately he states the pain has gotten worse. His son reports a fall, outside of his home on the concrete. Occurred 2 weeks ago. He did not think to give the office a call after the incident. At home he uses tylenol which does relieve the pain a little.  His balance also has worsened. Which results more use of his walker or holding onto the walls or nearby cabinets to lean onto.    Left knee pain    HPI Discussed the use of AI scribe software for clinical note transcription with the patient, who gave verbal consent to proceed.  History of Present Illness Chad Miller is an 83 year old male with spinal stenosis who presents with worsening balance issues and falls.  He is accompanied by his son today.   He has been experiencing worsening balance issues, which have intensified recently, leading to multiple falls at home. His son notes that his left leg drags, contributing to these incidents. The falls include one where he fell backwards and another where his left leg got caught at the front door.  He has a history of spinal stenosis and reports worsening lower back pain, primarily midline to the right side, described as a pulling sensation. He has been prescribed Voltaren but has been hesitant to use it regularly due to label warnings. He experiences knee discomfort, particularly when standing up from a seated position, and has been using a walker more frequently within the last week to aid with mobility around the house. His  son observes that he often stumbles and grimaces, suggesting pain in his knee and lower back when standing.  He has a history of prediabetes and hearing issues, with a formal hearing test conducted over five years ago. His son notes that he sometimes does not hear well, which may contribute to his balance issues. He reports occasional discomfort, lack of mobility, and balance issues. No new acute symptoms are reported, but he acknowledges a history of falls and difficulty with balance. He sometimes feels like the room is spinning.   Back Pain This is a chronic problem. The current episode started more than 1 year ago. The pain is The same all the time. The symptoms are aggravated by bending and sitting. Stiffness is present All day. Associated symptoms include numbness. Pertinent negatives include no pelvic pain or perianal numbness. Risk factors include sedentary lifestyle.  Hypertension This is a chronic problem. The current episode started more than 1 year ago. The problem has been gradually improving since onset. The problem is controlled. Pertinent negatives include no blurred vision or shortness of breath. Risk factors for coronary artery disease include dyslipidemia and male gender. Past treatments include angiotensin blockers. Hypertensive end-organ damage includes kidney disease. Identifiable causes of hypertension include chronic renal disease.  Hyperlipidemia This is a chronic problem. The current episode started more than 1 year ago. Exacerbating diseases include chronic renal disease. Pertinent negatives include no shortness of breath. Current antihyperlipidemic  treatment includes statins. The current treatment provides moderate improvement of lipids. Compliance problems include adherence to exercise.      Past Medical History:  Diagnosis Date   High cholesterol      Family History  Problem Relation Age of Onset   Heart disease Mother    Lung cancer Father    Asthma Sister     Heart disease Brother    Heart disease Brother    Allergic rhinitis Neg Hx    Eczema Neg Hx    Urticaria Neg Hx      Current Outpatient Medications:    acetaminophen (TYLENOL) 325 MG tablet, Take 650 mg by mouth every 6 (six) hours as needed., Disp: , Rfl:    amLODipine  (NORVASC ) 5 MG tablet, TAKE 1 TABLET EVERY EVENING, Disp: 90 tablet, Rfl: 3   atorvastatin  (LIPITOR) 10 MG tablet, TAKE 1 TABLET DAILY, Disp: 90 tablet, Rfl: 2   mupirocin  ointment (BACTROBAN ) 2 %, Apply 1 Application topically 2 (two) times daily., Disp: 30 g, Rfl: 0   nebivolol  (BYSTOLIC ) 10 MG tablet, Take 1 tablet (10 mg total) by mouth daily. TAKE 1 TABLET EVERY        MORNING. DISCONTINUE       METOPROLOL , Disp: 90 tablet, Rfl: 2   Nutritional Supplements (EQUATE) LIQD, Take by mouth daily., Disp: , Rfl:    olmesartan  (BENICAR ) 20 MG tablet, Take 1 tablet (20 mg total) by mouth every morning., Disp: 90 tablet, Rfl: 2   No Known Allergies   Review of Systems  Constitutional: Negative.   HENT: Negative.    Eyes:  Negative for blurred vision.  Respiratory: Negative.  Negative for shortness of breath.   Cardiovascular: Negative.   Gastrointestinal: Negative.   Endocrine: Negative.   Genitourinary:  Negative for pelvic pain.  Musculoskeletal:  Positive for back pain.  Skin: Negative.   Allergic/Immunologic: Negative.   Neurological:  Positive for numbness.  Hematological: Negative.      Today's Vitals   11/03/23 1550  BP: 118/74  Pulse: 65  Temp: 98 F (36.7 C)  SpO2: 98%  Weight: 144 lb 12.8 oz (65.7 kg)  Height: 5' 10 (1.778 m)   Body mass index is 20.78 kg/m.  Wt Readings from Last 3 Encounters:  11/03/23 144 lb 12.8 oz (65.7 kg)  06/18/23 147 lb (66.7 kg)  12/16/22 146 lb (66.2 kg)     Objective:  Physical Exam Vitals and nursing note reviewed.  Constitutional:      Appearance: Normal appearance.  HENT:     Head: Normocephalic and atraumatic.   Eyes:     Extraocular Movements:  Extraocular movements intact.    Cardiovascular:     Rate and Rhythm: Normal rate and regular rhythm.     Heart sounds: Normal heart sounds.  Pulmonary:     Effort: Pulmonary effort is normal.     Breath sounds: Normal breath sounds.   Musculoskeletal:     Cervical back: Normal range of motion.     Comments: Ambulatory with walker   Skin:    General: Skin is warm.   Neurological:     General: No focal deficit present.     Mental Status: He is alert.   Psychiatric:        Mood and Affect: Mood normal.         Assessment And Plan:  Spinal stenosis of lumbar region with neurogenic claudication Assessment & Plan: Severe foraminal stenosis with worsening symptoms. Neurosurgeon advised against surgery. Focus  on physical therapy for core strengthening and mobility. - Refer to orthopedic specialist for evaluation. - Consider physical therapy for core strengthening and mobility. - Use Voltaren on knee for pain management.  Orders: -     Ambulatory referral to Orthopedic Surgery  Recurrent falls Assessment & Plan: Chronic balance issues exacerbated by spinal stenosis and knee problems, causing falls and left leg dragging. Uses walker with mobility difficulties. - Initiate physical therapy for balance and mobility. - Consider home physical therapy initially, then outpatient. - Evaluate for orthopedic consultation before therapy.   Hypertensive heart and renal disease with renal failure, stage 1 through stage 4 or unspecified chronic kidney disease, without heart failure Assessment & Plan: Chronic, controlled.  He will continue with nebivolol  and olmesartan  in the mornings. He will continue with amlodipine  in the evenings.  Encouraged to follow low sodium diet. He will f/u in 6 months for re-evaluation.   Orders: -     CMP14+EGFR  Chronic renal disease, stage II Assessment & Plan: Chronic, I will check renal function today. He is encouraged to keep BP controlled and stay  well hydrated to prevent progression of CKD.   Orders: -     CMP14+EGFR  Chronic pain of left knee Assessment & Plan: Knee pain in previously replaced knee. Voltaren deemed appropriate for application. - Apply Voltaren to knee twice daily. - Follow up with orthopedic specialist for evaluation.  Orders: -     Ambulatory referral to Orthopedic Surgery  Hearing loss, unspecified hearing loss type, unspecified laterality Assessment & Plan: Possible hearing loss with no recent audiology evaluation. Formal hearing test ordered. - Order formal hearing test.  Orders: -     Ambulatory referral to Audiology  Other abnormal glucose Assessment & Plan: Previous labs reviewed, his A1c has been elevated in the past. I will check an A1c today. Reminded to avoid refined sugars including sugary drinks/foods and processed meats including bacon, sausages and deli meats.    Orders: -     CMP14+EGFR -     Hemoglobin A1c  Imbalance -     Vitamin B12; Future  Walker as ambulation aid   Return if symptoms worsen or fail to improve.  Patient was given opportunity to ask questions. Patient verbalized understanding of the plan and was able to repeat key elements of the plan. All questions were answered to their satisfaction.   I, Catheryn LOISE Slocumb, MD, have reviewed all documentation for this visit. The documentation on 11/03/23 for the exam, diagnosis, procedures, and orders are all accurate and complete.   IF YOU HAVE BEEN REFERRED TO A SPECIALIST, IT MAY TAKE 1-2 WEEKS TO SCHEDULE/PROCESS THE REFERRAL. IF YOU HAVE NOT HEARD FROM US /SPECIALIST IN TWO WEEKS, PLEASE GIVE US  A CALL AT [phone].   THE PATIENT IS ENCOURAGED TO PRACTICE SOCIAL DISTANCING DUE TO THE COVID-19 PANDEMIC.

## 2023-11-04 ENCOUNTER — Ambulatory Visit: Payer: Self-pay | Admitting: Internal Medicine

## 2023-11-04 LAB — CMP14+EGFR
ALT: 21 IU/L (ref 0–44)
AST: 19 IU/L (ref 0–40)
Albumin: 4 g/dL (ref 3.7–4.7)
Alkaline Phosphatase: 88 IU/L (ref 44–121)
BUN/Creatinine Ratio: 14 (ref 10–24)
BUN: 12 mg/dL (ref 8–27)
Bilirubin Total: 0.9 mg/dL (ref 0.0–1.2)
CO2: 21 mmol/L (ref 20–29)
Calcium: 9.3 mg/dL (ref 8.6–10.2)
Chloride: 108 mmol/L — ABNORMAL HIGH (ref 96–106)
Creatinine, Ser: 0.85 mg/dL (ref 0.76–1.27)
Globulin, Total: 3.5 g/dL (ref 1.5–4.5)
Glucose: 73 mg/dL (ref 70–99)
Potassium: 4.8 mmol/L (ref 3.5–5.2)
Sodium: 143 mmol/L (ref 134–144)
Total Protein: 7.5 g/dL (ref 6.0–8.5)
eGFR: 86 mL/min/{1.73_m2} (ref >59)

## 2023-11-04 LAB — HEMOGLOBIN A1C
Est. average glucose Bld gHb Est-mCnc: 111 mg/dL
Hgb A1c MFr Bld: 5.5 % (ref 4.8–5.6)

## 2023-11-09 DIAGNOSIS — H9190 Unspecified hearing loss, unspecified ear: Secondary | ICD-10-CM | POA: Insufficient documentation

## 2023-11-09 DIAGNOSIS — R296 Repeated falls: Secondary | ICD-10-CM | POA: Insufficient documentation

## 2023-11-09 NOTE — Assessment & Plan Note (Signed)
 Chronic balance issues exacerbated by spinal stenosis and knee problems, causing falls and left leg dragging. Uses walker with mobility difficulties. - Initiate physical therapy for balance and mobility. - Consider home physical therapy initially, then outpatient. - Evaluate for orthopedic consultation before therapy.

## 2023-11-09 NOTE — Assessment & Plan Note (Signed)
 Previous labs reviewed, his A1c has been elevated in the past. I will check an A1c today. Reminded to avoid refined sugars including sugary drinks/foods and processed meats including bacon, sausages and deli meats.

## 2023-11-09 NOTE — Assessment & Plan Note (Signed)
 Chronic, controlled.  He will continue with nebivolol  and olmesartan  in the mornings. He will continue with amlodipine  in the evenings.  Encouraged to follow low sodium diet. He will f/u in 6 months for re-evaluation.

## 2023-11-09 NOTE — Assessment & Plan Note (Signed)
 Chronic, I will check renal function today. He is encouraged to keep BP controlled and stay well hydrated to prevent progression of CKD.

## 2023-11-09 NOTE — Assessment & Plan Note (Signed)
 Knee pain in previously replaced knee. Voltaren deemed appropriate for application. - Apply Voltaren to knee twice daily. - Follow up with orthopedic specialist for evaluation.

## 2023-11-09 NOTE — Assessment & Plan Note (Signed)
 Severe foraminal stenosis with worsening symptoms. Neurosurgeon advised against surgery. Focus on physical therapy for core strengthening and mobility. - Refer to orthopedic specialist for evaluation. - Consider physical therapy for core strengthening and mobility. - Use Voltaren on knee for pain management.

## 2023-11-09 NOTE — Assessment & Plan Note (Signed)
 Possible hearing loss with no recent audiology evaluation. Formal hearing test ordered. - Order formal hearing test.

## 2023-11-13 ENCOUNTER — Other Ambulatory Visit

## 2023-11-13 DIAGNOSIS — R2689 Other abnormalities of gait and mobility: Secondary | ICD-10-CM | POA: Diagnosis not present

## 2023-11-14 LAB — VITAMIN B12: Vitamin B-12: 1019 pg/mL (ref 232–1245)

## 2023-11-18 ENCOUNTER — Encounter: Payer: Self-pay | Admitting: Internal Medicine

## 2023-11-19 ENCOUNTER — Ambulatory Visit: Admitting: Audiologist

## 2023-11-19 ENCOUNTER — Ambulatory Visit: Admitting: Family Medicine

## 2023-11-20 ENCOUNTER — Other Ambulatory Visit: Payer: Self-pay | Admitting: Internal Medicine

## 2023-11-20 DIAGNOSIS — I1310 Hypertensive heart and chronic kidney disease without heart failure, with stage 1 through stage 4 chronic kidney disease, or unspecified chronic kidney disease: Secondary | ICD-10-CM

## 2023-12-02 ENCOUNTER — Ambulatory Visit: Attending: Internal Medicine | Admitting: Audiologist

## 2023-12-02 DIAGNOSIS — H903 Sensorineural hearing loss, bilateral: Secondary | ICD-10-CM | POA: Insufficient documentation

## 2023-12-02 NOTE — Procedures (Signed)
  Outpatient Audiology and [facility] [address] [place], [place]  [address] [phone]  AUDIOLOGICAL  EVALUATION  NAME: Chad Miller     DOB:   11-May-1941      MRN: [id number]                                                                                     DATE: 12/02/2023     REFERENT: Jarold Medici, MD STATUS: Outpatient DIAGNOSIS: Sensorineural Hearing Loss    History: Chad Miller was seen for an audiological evaluation due to some difficulty hearing. Chad Miller is aware he likely has hearing loss. His wife corroborates this. Chad Miller had a hearing test many years ago, he is not sure where. He does not remember the results. He says his PCP strongly recommended a hearing test.  Chad Miller denies pain, pressure, or tinnitus.  Chad Miller no history of hazardous noise exposure.  Medical history shows chronic renal disease which can be an additional risk for hearing loss.    Evaluation:  Otoscopy showed a clear view of the tympanic membranes, bilaterally Tympanometry results were consistent with normal middle ear function Audiometric testing was completed using Conventional Audiometry techniques with insert earphones and supraural headphones. 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 Speech Recognition Thresholds were obtained at 45 dB HL in the right ear and at 40  dB HL in the left ear. Word Recognition Testing was completed at  40dB SL and Chad Miller scored 88% in the right ear and 96% in the left ear.    Results:  The test results were reviewed with Chad Miller and his wife. 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 He cannot hear many high pitched consonants. He needs hearing aids bilaterally.  Audiogram printed and provided to [facility] along with copy of local hearing aid providers, and information about [employer].   Recommendations: Hearing aids recommended for both ears. Patient  given list of local hearing aid providers.  Annual audiometric testing recommended to monitor hearing loss for progression.    35 minutes spent testing and counseling on results.   If you have any questions please feel free to contact me at [phone].  Lauraine Ka Stalnaker Au.D.  Audiologist   12/02/2023  3:49 PM  Cc: Jarold Medici, MD

## 2023-12-05 ENCOUNTER — Other Ambulatory Visit (INDEPENDENT_AMBULATORY_CARE_PROVIDER_SITE_OTHER): Payer: Self-pay

## 2023-12-05 ENCOUNTER — Ambulatory Visit: Admitting: Orthopedic Surgery

## 2023-12-05 ENCOUNTER — Encounter: Payer: Self-pay | Admitting: Orthopedic Surgery

## 2023-12-05 DIAGNOSIS — M25562 Pain in left knee: Secondary | ICD-10-CM

## 2023-12-05 DIAGNOSIS — M545 Low back pain, unspecified: Secondary | ICD-10-CM

## 2023-12-05 NOTE — Progress Notes (Signed)
 Office Visit Note   Patient: Chad Miller           Date of Birth: August 02, 1940           MRN: [id number] Visit Date: 12/05/2023 Requested by: Jarold Medici, MD [address] [place],  [place] [address] PCP: Jarold Medici, MD  Subjective: Chief Complaint  Patient presents with   Left Knee - Pain    HPI: Chad Miller is a 83 y.o. male who presents to the office reporting left knee pain.  Last seen 2023.  Patient is ambulating with a walker.  Reports some left leg and right leg weakness.  As well as radicular pain in both legs.  States his left leg is giving way.  He did have an MRI scan in 2023 as well as some epidural steroid injections at that time.  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  He has been told by his neurosurgeon not to ever have any surgery on his back.  Does have a weight machine that he uses for leg strengthening.  Having some balance issues.  Has been seen by neurologist for that issue as well.  Has also had physical therapy in the past which has not helped..                ROS: All systems reviewed are negative as they relate to the chief complaint within the history of present illness.  Patient denies fevers or chills.  Assessment & Plan: Visit Diagnoses:  1. Left knee pain, unspecified chronicity   2. Low back pain, unspecified back pain laterality, unspecified chronicity, unspecified whether sciatica present     Plan: Impression is functionally intact left total knee replacement with good range of motion no effusion and no radiographic change in 4 years.  I think these other problems are difficult to manage.  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  Slight hip flexion weakness.  He does not want to undergo surgery which is understandable.  Does not really want to try therapy or any further epidural steroid injections.  He is going to work a little bit harder on his weight machine at home to  keep his quad and leg strength optimized.  Will see him back as needed.  Follow-Up Instructions: No follow-ups on file.   Orders:  Orders Placed This Encounter  Procedures   XR Knee 1-2 Views Left   XR Lumbar Spine 2-3 Views   No orders of the defined types were placed in this encounter.     Procedures: No procedures performed   Clinical Data: No additional findings.  Objective: Vital Signs: There were no vitals taken for this visit.  Physical Exam:  Constitutional: Patient appears well-developed HEENT:  Head: Normocephalic Eyes:EOM are normal Neck: Normal range of motion Cardiovascular: Normal rate Pulmonary/chest: Effort normal Neurologic: Patient is alert Skin: Skin is warm Psychiatric: Patient has normal mood and affect  Ortho Exam: Ortho exam demonstrates full range of motion of the hip and ankle on the left-hand side.  Has full extension and flexion to about 115 on the left.  No effusion in the knee.  Collateral ligaments are stable on that side with no patellar apprehension.  No warmth to the knee.  Specialty Comments:  No specialty comments available.  Imaging: No results found.   PMFS History: Patient Active Problem List   Diagnosis Date Noted   Recurrent falls 11/09/2023   Hearing loss 11/09/2023  Other abnormal glucose 06/18/2023   Heat intolerance 06/18/2023   Unintentional weight loss 12/16/2022   Leucopenia 12/16/2022   Caregiver stress 12/16/2022   Open wound of knee 09/29/2022   Primary hypertension 01/14/2022   Chronic pain of left knee 12/13/2021   BMI 21.0-21.9, adult 12/13/2021   Peripheral artery disease (HCC) 12/13/2021   Hypertensive heart and renal disease 04/24/2021   Chronic renal disease, 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   Pure hypercholesterolemia 04/24/2021   OSA (obstructive sleep apnea) 08/15/2016   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   Snoring 07/05/2016   Excessive daytime sleepiness 07/05/2016   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   Gait disturbance 04/22/2016   Vertigo 04/22/2016   Urinary urgency 04/22/2016   Spinal stenosis of lumbar region 04/22/2016   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   Past Medical History:  Diagnosis Date   High cholesterol     Family History  Problem Relation Age of Onset   Heart disease Mother    Lung cancer Father    Asthma Sister    Heart disease Brother    Heart disease Brother    Allergic rhinitis Neg Hx    Eczema Neg Hx    Urticaria Neg Hx     Past Surgical History:  Procedure Laterality Date   CATARACT EXTRACTION     REPLACEMENT TOTAL KNEE Left 2004   Social History   Occupational History   Occupation: Retired  Tobacco Use   Smoking status: Never   Smokeless tobacco: Never  Vaping Use   Vaping status: Never Used  Substance and Sexual Activity   Alcohol use: No    Comment: Quit 2015   Drug use: No   Sexual activity: Yes    Partners: Female    Comment: Married

## 2023-12-16 ENCOUNTER — Ambulatory Visit: Payer: Medicare Other | Admitting: Internal Medicine

## 2023-12-22 ENCOUNTER — Encounter: Payer: Self-pay | Admitting: Internal Medicine

## 2023-12-22 ENCOUNTER — Ambulatory Visit (INDEPENDENT_AMBULATORY_CARE_PROVIDER_SITE_OTHER): Admitting: Internal Medicine

## 2023-12-22 VITALS — BP 110/60 | HR 58 | Temp 97.9°F | Ht 70.0 in | Wt 142.0 lb

## 2023-12-22 DIAGNOSIS — F419 Anxiety disorder, unspecified: Secondary | ICD-10-CM

## 2023-12-22 DIAGNOSIS — E7800 Pure hypercholesterolemia, unspecified: Secondary | ICD-10-CM

## 2023-12-22 DIAGNOSIS — Z7409 Other reduced mobility: Secondary | ICD-10-CM

## 2023-12-22 DIAGNOSIS — R296 Repeated falls: Secondary | ICD-10-CM | POA: Diagnosis not present

## 2023-12-22 DIAGNOSIS — R2689 Other abnormalities of gait and mobility: Secondary | ICD-10-CM | POA: Diagnosis not present

## 2023-12-22 DIAGNOSIS — R630 Anorexia: Secondary | ICD-10-CM | POA: Diagnosis not present

## 2023-12-22 DIAGNOSIS — M48062 Spinal stenosis, lumbar region with neurogenic claudication: Secondary | ICD-10-CM | POA: Diagnosis not present

## 2023-12-22 DIAGNOSIS — N182 Chronic kidney disease, stage 2 (mild): Secondary | ICD-10-CM

## 2023-12-22 DIAGNOSIS — Z9989 Dependence on other enabling machines and devices: Secondary | ICD-10-CM

## 2023-12-22 DIAGNOSIS — R634 Abnormal weight loss: Secondary | ICD-10-CM | POA: Diagnosis not present

## 2023-12-22 DIAGNOSIS — K5909 Other constipation: Secondary | ICD-10-CM | POA: Diagnosis not present

## 2023-12-22 DIAGNOSIS — I739 Peripheral vascular disease, unspecified: Secondary | ICD-10-CM

## 2023-12-22 DIAGNOSIS — I1310 Hypertensive heart and chronic kidney disease without heart failure, with stage 1 through stage 4 chronic kidney disease, or unspecified chronic kidney disease: Secondary | ICD-10-CM

## 2023-12-22 DIAGNOSIS — R7309 Other abnormal glucose: Secondary | ICD-10-CM

## 2023-12-22 DIAGNOSIS — R269 Unspecified abnormalities of gait and mobility: Secondary | ICD-10-CM

## 2023-12-22 MED ORDER — MIRTAZAPINE 7.5 MG PO TABS: 7.5000 mg | ORAL_TABLET | Freq: Every day | ORAL | 1 refills | Status: AC

## 2023-12-22 NOTE — Progress Notes (Unsigned)
 I,Jameka J Llittleton, CMA,acting as a Neurosurgeon for Catheryn LOISE Slocumb, MD.,have documented all relevant documentation on the behalf of Catheryn LOISE Slocumb, MD,as directed by  Catheryn LOISE Slocumb, MD while in the presence of Catheryn LOISE Slocumb, MD.  Subjective:  Patient ID: Chad Miller , male    DOB: 1940/12/10 , 83 y.o.   MRN: [id number]  Chief Complaint  Patient presents with   Hypertension    Patient presents today for bpc. Patient reports compliance with his meds. Patient denies having chest pain, sob or headaches at this time. Patient doesn't have any questions or concerns at this time.    HPI Discussed the use of AI scribe software for clinical note transcription with the patient, who gave verbal consent to proceed.  History of Present Illness Chad Miller is an 84 year old male who presents with mobility issues and recent falls. He is accompanied by his son.   He has significant difficulty with mobility, primarily due to challenges in getting up and moving around. He has experienced falls, including rolling down a hill, and sometimes loses his balance. Although he has not had any severe falls since the last visit, he states he often loses his balance.  He has seen an orthopedic specialist who did not recommend any procedures and is currently awaiting physical therapy to help with strengthening and improving mobility. He has various assistive devices at home, including a seated elliptical, which he uses occasionally.  He has experienced a decrease in appetite and has lost five pounds since January, with a two-pound loss since the last visit. He does not eat a balanced diet, with limited vegetable intake, affecting his bowel regularity. His family notes that he often consumes quick and easy meals due to convenience, raising concerns about the lack of nutrient-dense foods in his diet, which may contribute to his weight loss and overall health.  He underwent a hearing evaluation and was recommended  hearing aids, which he has not yet acquired. He was provided with a list of providers for hearing aids but has not pursued it due to focusing on his mobility issues.  He has a history of anxiety, which is being monitored.   Hypertension This is a chronic problem. The current episode started more than 1 year ago. The problem has been gradually improving since onset. The problem is controlled. Pertinent negatives include no blurred vision. Risk factors for coronary artery disease include dyslipidemia and male gender. Past treatments include angiotensin blockers. Hypertensive end-organ damage includes kidney disease. Identifiable causes of hypertension include chronic renal disease.  Back Pain This is a chronic problem. The current episode started more than 1 year ago. The pain is The same all the time. The symptoms are aggravated by bending and sitting. Stiffness is present All day. Pertinent negatives include no pelvic pain or perianal numbness. Risk factors include sedentary lifestyle.  Hyperlipidemia This is a chronic problem. The current episode started more than 1 year ago. Exacerbating diseases include chronic renal disease. Current antihyperlipidemic treatment includes statins. The current treatment provides moderate improvement of lipids. Compliance problems include adherence to exercise.      Past Medical History:  Diagnosis Date   High cholesterol      Family History  Problem Relation Age of Onset   Heart disease Mother    Lung cancer Father    Asthma Sister    Heart disease Brother    Heart disease Brother    Allergic rhinitis Neg Hx    Eczema Neg  Hx    Urticaria Neg Hx      Current Outpatient Medications:    acetaminophen (TYLENOL) 325 MG tablet, Take 650 mg by mouth every 6 (six) hours as needed., Disp: , Rfl:    amLODipine  (NORVASC ) 5 MG tablet, TAKE 1 TABLET EVERY EVENING, Disp: 90 tablet, Rfl: 3   atorvastatin  (LIPITOR) 10 MG tablet, TAKE 1 TABLET DAILY, Disp: 90  tablet, Rfl: 2   mirtazapine  (REMERON ) 7.5 MG tablet, Take 1 tablet (7.5 mg total) by mouth at bedtime., Disp: 30 tablet, Rfl: 1   mupirocin  ointment (BACTROBAN ) 2 %, Apply 1 Application topically 2 (two) times daily., Disp: 30 g, Rfl: 0   nebivolol  (BYSTOLIC ) 10 MG tablet, TAKE 1 TABLET EVERY        MORNING. DISCONTINUE       METOPROLOL , Disp: 90 tablet, Rfl: 2   Nutritional Supplements (EQUATE) LIQD, Take by mouth daily., Disp: , Rfl:    olmesartan  (BENICAR ) 20 MG tablet, TAKE 1 TABLET EVERY MORNING, Disp: 90 tablet, Rfl: 2   No Known Allergies   Review of Systems  Constitutional: Negative.   Eyes: Negative.  Negative for blurred vision.  Genitourinary:  Negative for pelvic pain.  Musculoskeletal: Negative.  Positive for back pain.  Skin: Negative.   Psychiatric/Behavioral: Negative.       Today's Vitals   12/22/23 1129  BP: 110/60  Pulse: (!) 58  Temp: 97.9 F (36.6 C)  Weight: 142 lb (64.4 kg)  Height: 5' 10 (1.778 m)  PainSc: 5   PainLoc: Back   Body mass index is 20.37 kg/m.  Wt Readings from Last 3 Encounters:  12/22/23 142 lb (64.4 kg)  11/03/23 144 lb 12.8 oz (65.7 kg)  06/18/23 147 lb (66.7 kg)    The ASCVD Risk score (Arnett DK, et al., 2019) failed to calculate for the following reasons:   The 2019 ASCVD risk score is only valid for ages 7 to 43  Objective:  Physical Exam Vitals and nursing note reviewed.  Constitutional:      Appearance: Normal appearance.  HENT:     Head: Normocephalic and atraumatic.  Eyes:     Extraocular Movements: Extraocular movements intact.  Cardiovascular:     Rate and Rhythm: Normal rate and regular rhythm.     Heart sounds: Normal heart sounds.  Pulmonary:     Effort: Pulmonary effort is normal.     Breath sounds: Normal breath sounds.  Musculoskeletal:     Comments: Uses walker for ambulation  Skin:    General: Skin is warm.  Neurological:     General: No focal deficit present.     Mental Status: He is alert.   Psychiatric:        Mood and Affect: Mood normal.         Assessment And Plan:  Hypertensive heart and renal disease with renal failure, stage 1 through stage 4 or unspecified chronic kidney disease, without heart failure Assessment & Plan: Chronic, controlled.  He will continue with nebivolol  and olmesartan  in the mornings. He will continue with amlodipine  in the evenings.  Encouraged to follow low sodium diet. He will f/u in 6 months for re-evaluation.   Orders: -     Lipid panel -     AMB Referral VBCI Care Management  Peripheral artery disease (HCC) Assessment & Plan: Chronic, encouraged to increase daily activity. He is reminded chair exercises count as well.  He is on statin therapy. Cardiology input is appreciated.  Chronic renal disease, stage II Assessment & Plan: Chronic, I will check renal function today. He is encouraged to keep BP controlled and stay well hydrated to prevent progression of CKD.   Orders: -     AMB Referral VBCI Care Management  Pure hypercholesterolemia Assessment & Plan: Chronic, he will continue with atorvastatin  10mg  daily. LDL goal < 70.  He is encouraged to follow heart healthy lifestyle.   Orders: -     Lipid panel -     AMB Referral VBCI Care Management  Spinal stenosis of lumbar region with neurogenic claudication Assessment & Plan: Orthopedic evaluation did not recommend procedures. Physical therapy indicated for core strength improvement and fall risk reduction. Home safety evaluation necessary. - Initiate home-based physical therapy for strengthening and ambulation with assistive devices. - Conduct a home safety evaluation to reduce fall risk.  Orders: -     Ambulatory referral to Home Health  Recurrent falls Assessment & Plan: He has had orthopedic evaluation who did not recommend procedures. Physical therapy indicated for core strength improvement and fall risk reduction. Home safety evaluation necessary. - Initiate  home-based physical therapy for strengthening and ambulation with assistive devices. - Conduct a home safety evaluation to reduce fall risk.  Orders: -     Ambulatory referral to Home Health  Imbalance -     Ambulatory referral to Home Health  Chronic constipation Assessment & Plan: Constipation likely due to decreased dietary fiber intake. Discussed importance of fiber to prevent constipation and avoid emergency interventions. - Encourage increased intake of fiber-rich foods such as broccoli and green beans. - Educate on the importance of dietary fiber to prevent constipation.   Unintentional weight loss Assessment & Plan: Unintentional weight loss of five pounds since January with decreased appetite. Discussed mirtazapine  for appetite stimulation and mood improvement. Emphasized nutrition for organ function and infection resistance. - Prescribe mirtazapine  to be taken at night to stimulate appetite and improve mood. - Schedule follow-up in 10 weeks to assess weight gain and medication effectiveness. - Perform blood tests to check cholesterol and pre-albumin levels. - Refer to a Child psychotherapist for community resources   Orders: -     Prealbumin  Anxiety Assessment & Plan: Anxiety present with potential impact on well-being. Discussed role of community resources in management. 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  Orders: -     AMB Referral VBCI Care Management  Impaired mobility -     Ambulatory referral to [facility] as ambulation aid  Other orders -     Mirtazapine ; Take 1 tablet (7.5 mg total) by mouth at bedtime.  Dispense: 30 tablet; Refill: 1   Return in about 6 months (around 06/23/2024), or bp check, for 10 weeks - weight check.  Patient was given opportunity to ask questions. Patient verbalized understanding of the plan and was able to repeat key elements of the plan. All questions were answered to their  satisfaction.    I, Catheryn LOISE Slocumb, MD, have reviewed all documentation for this visit. The documentation on 12/22/23 for the exam, diagnosis, procedures, and orders are all accurate and complete.   IF YOU HAVE BEEN REFERRED TO A SPECIALIST, IT MAY TAKE 1-2 WEEKS TO SCHEDULE/PROCESS THE REFERRAL. IF YOU HAVE NOT HEARD FROM US /SPECIALIST IN TWO WEEKS, PLEASE GIVE US  A CALL AT [phone].

## 2023-12-22 NOTE — Patient Instructions (Signed)
 Hypertension, Adult Hypertension is another name for high blood pressure. High blood pressure forces your heart to work harder to pump blood. This can cause problems over time. There are two numbers in a blood pressure reading. There is a top number (systolic) over a bottom number (diastolic). It is best to have a blood pressure that is below 120/80. What are the causes? The cause of this condition is not known. Some other conditions can lead to high blood pressure. What increases the risk? Some lifestyle factors can make you more likely to develop high blood pressure: Smoking. Not getting enough exercise or physical activity. Being overweight. Having too much fat, sugar, calories, or salt (sodium) in your diet. Drinking too much alcohol. Other risk factors include: Having any of these conditions: Heart disease. Diabetes. High cholesterol. Kidney disease. Obstructive sleep apnea. Having a family history of high blood pressure and high cholesterol. Age. The risk increases with age. Stress. What are the signs or symptoms? High blood pressure may not cause symptoms. Very high blood pressure (hypertensive crisis) may cause: Headache. Fast or uneven heartbeats (palpitations). Shortness of breath. Nosebleed. Vomiting or feeling like you may vomit (nauseous). Changes in how you see. Very bad chest pain. Feeling dizzy. Seizures. How is this treated? This condition is treated by making healthy lifestyle changes, such as: Eating healthy foods. Exercising more. Drinking less alcohol. Your doctor may prescribe medicine if lifestyle changes do not help enough and if: Your top number is above 130. Your bottom number is above 80. Your personal target blood pressure may vary. Follow these instructions at home: Eating and drinking  If told, follow the DASH eating plan. To follow this plan: Fill one half of your plate at each meal with fruits and vegetables. Fill one fourth of your plate  at each meal with whole grains. Whole grains include whole-wheat pasta, brown rice, and whole-grain bread. Eat or drink low-fat dairy products, such as skim milk or low-fat yogurt. Fill one fourth of your plate at each meal with low-fat (lean) proteins. Low-fat proteins include fish, chicken without skin, eggs, beans, and tofu. Avoid fatty meat, cured and processed meat, or chicken with skin. Avoid pre-made or processed food. Limit the amount of salt in your diet to less than 1,500 mg each day. Do not drink alcohol if: Your doctor tells you not to drink. You are pregnant, may be pregnant, or are planning to become pregnant. If you drink alcohol: Limit how much you have to: 0-1 drink a day for women. 0-2 drinks a day for men. Know how much alcohol is in your drink. In the [place] one drink equals one 12 oz bottle of beer (355 mL), one 5 oz glass of wine (148 mL), or one 1 oz glass of hard liquor (44 mL). Lifestyle  Work with your doctor to stay at a healthy weight or to lose weight. Ask your doctor what the best weight is for you. Get at least 30 minutes of exercise that causes your heart to beat faster (aerobic exercise) most days of the week. This may include walking, swimming, or biking. Get at least 30 minutes of exercise that strengthens your muscles (resistance exercise) at least 3 days a week. This may include lifting weights or doing Pilates. Do not smoke or use any products that contain nicotine or tobacco. If you need help quitting, ask your doctor. Check your blood pressure at home as told by your doctor. Keep all follow-up visits. Medicines Take over-the-counter and prescription medicines  only as told by your doctor. Follow directions carefully. Do not skip doses of blood pressure medicine. The medicine does not work as well if you skip doses. Skipping doses also puts you at risk for problems. Ask your doctor about side effects or reactions to medicines that you should watch  for. Contact a doctor if: You think you are having a reaction to the medicine you are taking. You have headaches that keep coming back. You feel dizzy. You have swelling in your ankles. You have trouble with your vision. Get help right away if: You get a very bad headache. You start to feel mixed up (confused). You feel weak or numb. You feel faint. You have very bad pain in your: Chest. Belly (abdomen). You vomit more than once. You have trouble breathing. These symptoms may be an emergency. Get help right away. Call 911. Do not wait to see if the symptoms will go away. Do not drive yourself to the hospital. Summary Hypertension is another name for high blood pressure. High blood pressure forces your heart to work harder to pump blood. For most people, a normal blood pressure is less than 120/80. Making healthy choices can help lower blood pressure. If your blood pressure does not get lower with healthy choices, you may need to take medicine. This information is not intended to replace advice given to you by your health care provider. Make sure you discuss any questions you have with your health care provider. Document Revised: 02/22/2021 Document Reviewed: 02/22/2021 Elsevier Patient Education  2024 [employer].

## 2023-12-23 ENCOUNTER — Ambulatory Visit: Payer: Self-pay | Admitting: Internal Medicine

## 2023-12-23 DIAGNOSIS — F419 Anxiety disorder, unspecified: Secondary | ICD-10-CM | POA: Insufficient documentation

## 2023-12-23 DIAGNOSIS — K5909 Other constipation: Secondary | ICD-10-CM | POA: Insufficient documentation

## 2023-12-23 DIAGNOSIS — Z7409 Other reduced mobility: Secondary | ICD-10-CM | POA: Insufficient documentation

## 2023-12-23 DIAGNOSIS — R2689 Other abnormalities of gait and mobility: Secondary | ICD-10-CM | POA: Insufficient documentation

## 2023-12-23 LAB — LIPID PANEL
Chol/HDL Ratio: 2.5 ratio (ref 0.0–5.0)
Cholesterol, Total: 121 mg/dL (ref 100–199)
HDL: 49 mg/dL (ref >39)
LDL Chol Calc (NIH): 63 mg/dL (ref 0–99)
Triglycerides: 34 mg/dL (ref 0–149)
VLDL Cholesterol Cal: 9 mg/dL (ref 5–40)

## 2023-12-23 LAB — PREALBUMIN: PREALBUMIN: 15 mg/dL (ref 9–32)

## 2023-12-23 NOTE — Assessment & Plan Note (Addendum)
 Unintentional weight loss of five pounds since January with decreased appetite. Discussed mirtazapine  for appetite stimulation and mood improvement. Emphasized nutrition for organ function and infection resistance. - Prescribe mirtazapine  to be taken at night to stimulate appetite and improve mood. - Schedule follow-up in 10 weeks to assess weight gain and medication effectiveness. - Perform blood tests to check cholesterol and pre-albumin levels. - Refer to a Child psychotherapist for [employer]

## 2023-12-23 NOTE — Assessment & Plan Note (Signed)
 Constipation likely due to decreased dietary fiber intake. Discussed importance of fiber to prevent constipation and avoid emergency interventions. - Encourage increased intake of fiber-rich foods such as broccoli and green beans. - Educate on the importance of dietary fiber to prevent constipation.

## 2023-12-23 NOTE — Assessment & Plan Note (Signed)
 Orthopedic evaluation did not recommend procedures. Physical therapy indicated for core strength improvement and fall risk reduction. Home safety evaluation necessary. - Initiate home-based physical therapy for strengthening and ambulation with assistive devices. - Conduct a home safety evaluation to reduce fall risk.

## 2023-12-23 NOTE — Assessment & Plan Note (Signed)
 Chronic, he will continue with atorvastatin 10mg  daily. LDL goal < 70.  He is encouraged to follow heart healthy lifestyle.

## 2023-12-23 NOTE — Assessment & Plan Note (Signed)
 He has had orthopedic evaluation who did not recommend procedures. Physical therapy indicated for core strength improvement and fall risk reduction. Home safety evaluation necessary. - Initiate home-based physical therapy for strengthening and ambulation with assistive devices. - Conduct a home safety evaluation to reduce fall risk.

## 2023-12-23 NOTE — Assessment & Plan Note (Signed)
 Chronic, I will check renal function today. He is encouraged to keep BP controlled and stay well hydrated to prevent progression of CKD.

## 2023-12-23 NOTE — Assessment & Plan Note (Signed)
 Anxiety present with potential impact on well-being. Discussed role of community resources in management. 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

## 2023-12-23 NOTE — Assessment & Plan Note (Signed)
 Chronic, encouraged to increase daily activity. He is reminded chair exercises count as well.  He is on statin therapy. Cardiology input is appreciated.

## 2023-12-23 NOTE — Assessment & Plan Note (Signed)
 Chronic, controlled.  He will continue with nebivolol  and olmesartan  in the mornings. He will continue with amlodipine  in the evenings.  Encouraged to follow low sodium diet. He will f/u in 6 months for re-evaluation.

## 2023-12-26 ENCOUNTER — Telehealth: Payer: Self-pay

## 2023-12-26 DIAGNOSIS — Z91148 Patient's other noncompliance with medication regimen for other reason: Secondary | ICD-10-CM | POA: Diagnosis not present

## 2023-12-26 DIAGNOSIS — E7800 Pure hypercholesterolemia, unspecified: Secondary | ICD-10-CM | POA: Diagnosis not present

## 2023-12-26 DIAGNOSIS — M5416 Radiculopathy, lumbar region: Secondary | ICD-10-CM | POA: Diagnosis not present

## 2023-12-26 DIAGNOSIS — R262 Difficulty in walking, not elsewhere classified: Secondary | ICD-10-CM | POA: Diagnosis not present

## 2023-12-26 DIAGNOSIS — Z9181 History of falling: Secondary | ICD-10-CM | POA: Diagnosis not present

## 2023-12-26 DIAGNOSIS — N182 Chronic kidney disease, stage 2 (mild): Secondary | ICD-10-CM | POA: Diagnosis not present

## 2023-12-26 DIAGNOSIS — I1310 Hypertensive heart and chronic kidney disease without heart failure, with stage 1 through stage 4 chronic kidney disease, or unspecified chronic kidney disease: Secondary | ICD-10-CM | POA: Diagnosis not present

## 2023-12-26 DIAGNOSIS — F419 Anxiety disorder, unspecified: Secondary | ICD-10-CM | POA: Diagnosis not present

## 2023-12-26 DIAGNOSIS — M48062 Spinal stenosis, lumbar region with neurogenic claudication: Secondary | ICD-10-CM | POA: Diagnosis not present

## 2023-12-26 DIAGNOSIS — R634 Abnormal weight loss: Secondary | ICD-10-CM | POA: Diagnosis not present

## 2023-12-26 DIAGNOSIS — Z6820 Body mass index (BMI) 20.0-20.9, adult: Secondary | ICD-10-CM | POA: Diagnosis not present

## 2023-12-26 DIAGNOSIS — I739 Peripheral vascular disease, unspecified: Secondary | ICD-10-CM | POA: Diagnosis not present

## 2023-12-26 DIAGNOSIS — K5909 Other constipation: Secondary | ICD-10-CM | POA: Diagnosis not present

## 2023-12-26 NOTE — Progress Notes (Signed)
 Complex Care Management Note  Care Guide Note 12/26/2023 Name: Chad Miller MRN: [id number] DOB: June 14, 1940  Chad Miller is a 83 y.o. year old male who sees Jarold Medici, MD for primary care. I reached out to Chesley Sherry by phone today to offer complex care management services.  Chad Miller was given information about Complex Care Management services today including:   The Complex Care Management services include support from the care team which includes your Nurse Care Manager, Clinical Social Worker, or Pharmacist.  The Complex Care Management team is here to help remove barriers to the health concerns and goals most important to you. Complex Care Management services are voluntary, and the patient may decline or stop services at any time by request to their care team member.   Complex Care Management Consent Status: Patient agreed to services and verbal consent obtained.   Follow up plan:  Telephone appointment with complex care management team member scheduled for:  12/31/23 @ 11 AM and 01/30/24 @ 11 AM.  Encounter Outcome:  Patient Scheduled  Leotis Rase [facility], [facility] Guide  Direct Dial: [phone]  Fax [phone]

## 2023-12-31 ENCOUNTER — Telehealth: Admitting: Licensed Clinical Social Worker

## 2024-01-01 ENCOUNTER — Encounter: Payer: Self-pay | Admitting: Licensed Clinical Social Worker

## 2024-01-01 ENCOUNTER — Telehealth: Payer: Self-pay | Admitting: Licensed Clinical Social Worker

## 2024-01-02 DIAGNOSIS — I1310 Hypertensive heart and chronic kidney disease without heart failure, with stage 1 through stage 4 chronic kidney disease, or unspecified chronic kidney disease: Secondary | ICD-10-CM | POA: Diagnosis not present

## 2024-01-02 DIAGNOSIS — M5416 Radiculopathy, lumbar region: Secondary | ICD-10-CM | POA: Diagnosis not present

## 2024-01-02 DIAGNOSIS — M48062 Spinal stenosis, lumbar region with neurogenic claudication: Secondary | ICD-10-CM | POA: Diagnosis not present

## 2024-01-02 DIAGNOSIS — N182 Chronic kidney disease, stage 2 (mild): Secondary | ICD-10-CM | POA: Diagnosis not present

## 2024-01-02 DIAGNOSIS — R262 Difficulty in walking, not elsewhere classified: Secondary | ICD-10-CM | POA: Diagnosis not present

## 2024-01-02 DIAGNOSIS — I739 Peripheral vascular disease, unspecified: Secondary | ICD-10-CM | POA: Diagnosis not present

## 2024-01-05 ENCOUNTER — Telehealth: Payer: Self-pay

## 2024-01-05 NOTE — Progress Notes (Signed)
 Complex Care Management Care Guide Note  01/05/2024 Name: Chad Miller MRN: [id number] DOB: [birth year]  Chad Miller is a 83 y.o. year old male who is a primary care patient of Jarold Medici, MD and is actively engaged with the care management team. I reached out to Chesley Sherry by phone today to assist with re-scheduling  with the BSW.  Follow up plan: Telephone appointment with complex care management team member scheduled for:  01/07/24 @ 11 AM  Leotis [facility]  [facility], [facility] Guide  Direct Dial: [phone]  Fax [phone]

## 2024-01-07 ENCOUNTER — Telehealth: Payer: Self-pay | Admitting: Licensed Clinical Social Worker

## 2024-01-07 ENCOUNTER — Encounter: Payer: Self-pay | Admitting: Licensed Clinical Social Worker

## 2024-01-07 DIAGNOSIS — N182 Chronic kidney disease, stage 2 (mild): Secondary | ICD-10-CM | POA: Diagnosis not present

## 2024-01-07 DIAGNOSIS — I739 Peripheral vascular disease, unspecified: Secondary | ICD-10-CM | POA: Diagnosis not present

## 2024-01-07 DIAGNOSIS — M48062 Spinal stenosis, lumbar region with neurogenic claudication: Secondary | ICD-10-CM | POA: Diagnosis not present

## 2024-01-07 DIAGNOSIS — R262 Difficulty in walking, not elsewhere classified: Secondary | ICD-10-CM | POA: Diagnosis not present

## 2024-01-07 DIAGNOSIS — M5416 Radiculopathy, lumbar region: Secondary | ICD-10-CM | POA: Diagnosis not present

## 2024-01-07 DIAGNOSIS — I1310 Hypertensive heart and chronic kidney disease without heart failure, with stage 1 through stage 4 chronic kidney disease, or unspecified chronic kidney disease: Secondary | ICD-10-CM | POA: Diagnosis not present

## 2024-01-08 ENCOUNTER — Other Ambulatory Visit: Payer: Self-pay | Admitting: Licensed Clinical Social Worker

## 2024-01-08 NOTE — Patient Instructions (Signed)
 Visit Information  Thank you for taking time to visit with me today. Please don't hesitate to contact me if I can be of assistance to you before our next scheduled appointment.  Our next appointment is no further scheduled appointments.   Please call the care guide team at [phone] if you need to cancel or reschedule your appointment.   Following is a copy of your care plan:   Goals Addressed   None     Please call the Suicide and Crisis Lifeline: 988 go to [facility] Urgent [facility] [address] [place] [phone]) call 911 if you are experiencing a Mental Health or Behavioral Health Crisis or need someone to talk to.  Patient verbalizes understanding of instructions and care plan provided today and agrees to view in MyChart. Active MyChart status and patient understanding of how to access instructions and care plan via MyChart confirmed with patient.     Chad CHARM [name], PhD [facility], [facility] Social Worker Direct Dial: [phone]  Fax: [phone]

## 2024-01-08 NOTE — Patient Outreach (Signed)
 Complex Care Management   Visit Note  01/08/2024  Name:  Chad Miller MRN: [id number] DOB: Jan 05, 1941  Situation: Referral received for Complex Care Management related to SDOH Barriers:  none at this time I obtained verbal consent from Patient.  Visit completed with Patient  on the phone  Background:   Past Medical History:  Diagnosis Date   High cholesterol     Assessment: SW completed the SDOH assessment and the patient did not have any needs at this time.   SDOH Interventions    Flowsheet Row Patient Outreach Telephone from 01/08/2024 in [place] [facility] Clinical Support from 05/16/2022 in [facility] Chronic Care Management from 05/08/2022 in [facility] Chronic Care Management from 11/02/2020 in [facility] Clinical Support from 04/01/2019 in [facility]  SDOH Interventions       Food Insecurity Interventions Intervention Not Indicated Intervention Not Indicated Intervention Not Indicated Intervention Not Indicated --  Housing Interventions Intervention Not Indicated -- Intervention Not Indicated Intervention Not Indicated --  Transportation Interventions Intervention Not Indicated Intervention Not Indicated Intervention Not Indicated Intervention Not Indicated --  Utilities Interventions Intervention Not Indicated -- Intervention Not Indicated -- --  Depression Interventions/Treatment  -- -- -- -- PHQ2-9 Score <4 Follow-up Not Indicated  Financial Strain Interventions Intervention Not Indicated Intervention Not Indicated Intervention Not Indicated -- --  Physical Activity Interventions -- Patient Refused, Other (Comments) Patient Refused -- --  Stress Interventions -- Intervention Not Indicated Intervention Not Indicated -- --  Social Connections Interventions -- -- Patient Refused -- --      Recommendation:    none  Follow Up Plan:   Closing From:  Complex Care Management  Tobias CHARM [name], PhD [facility], [facility] Social Worker Direct Dial: [phone]  Fax: [phone]

## 2024-01-13 DIAGNOSIS — R262 Difficulty in walking, not elsewhere classified: Secondary | ICD-10-CM | POA: Diagnosis not present

## 2024-01-13 DIAGNOSIS — I1310 Hypertensive heart and chronic kidney disease without heart failure, with stage 1 through stage 4 chronic kidney disease, or unspecified chronic kidney disease: Secondary | ICD-10-CM | POA: Diagnosis not present

## 2024-01-13 DIAGNOSIS — I739 Peripheral vascular disease, unspecified: Secondary | ICD-10-CM | POA: Diagnosis not present

## 2024-01-13 DIAGNOSIS — M5416 Radiculopathy, lumbar region: Secondary | ICD-10-CM | POA: Diagnosis not present

## 2024-01-13 DIAGNOSIS — M48062 Spinal stenosis, lumbar region with neurogenic claudication: Secondary | ICD-10-CM | POA: Diagnosis not present

## 2024-01-13 DIAGNOSIS — N182 Chronic kidney disease, stage 2 (mild): Secondary | ICD-10-CM | POA: Diagnosis not present

## 2024-01-20 DIAGNOSIS — R262 Difficulty in walking, not elsewhere classified: Secondary | ICD-10-CM | POA: Diagnosis not present

## 2024-01-20 DIAGNOSIS — I739 Peripheral vascular disease, unspecified: Secondary | ICD-10-CM | POA: Diagnosis not present

## 2024-01-20 DIAGNOSIS — M48062 Spinal stenosis, lumbar region with neurogenic claudication: Secondary | ICD-10-CM | POA: Diagnosis not present

## 2024-01-20 DIAGNOSIS — N182 Chronic kidney disease, stage 2 (mild): Secondary | ICD-10-CM | POA: Diagnosis not present

## 2024-01-20 DIAGNOSIS — M5416 Radiculopathy, lumbar region: Secondary | ICD-10-CM | POA: Diagnosis not present

## 2024-01-20 DIAGNOSIS — I1310 Hypertensive heart and chronic kidney disease without heart failure, with stage 1 through stage 4 chronic kidney disease, or unspecified chronic kidney disease: Secondary | ICD-10-CM | POA: Diagnosis not present

## 2024-01-25 DIAGNOSIS — R634 Abnormal weight loss: Secondary | ICD-10-CM | POA: Diagnosis not present

## 2024-01-25 DIAGNOSIS — R262 Difficulty in walking, not elsewhere classified: Secondary | ICD-10-CM | POA: Diagnosis not present

## 2024-01-25 DIAGNOSIS — M48062 Spinal stenosis, lumbar region with neurogenic claudication: Secondary | ICD-10-CM | POA: Diagnosis not present

## 2024-01-25 DIAGNOSIS — K5909 Other constipation: Secondary | ICD-10-CM | POA: Diagnosis not present

## 2024-01-25 DIAGNOSIS — I739 Peripheral vascular disease, unspecified: Secondary | ICD-10-CM | POA: Diagnosis not present

## 2024-01-25 DIAGNOSIS — Z6820 Body mass index (BMI) 20.0-20.9, adult: Secondary | ICD-10-CM | POA: Diagnosis not present

## 2024-01-25 DIAGNOSIS — Z9181 History of falling: Secondary | ICD-10-CM | POA: Diagnosis not present

## 2024-01-25 DIAGNOSIS — N182 Chronic kidney disease, stage 2 (mild): Secondary | ICD-10-CM | POA: Diagnosis not present

## 2024-01-25 DIAGNOSIS — I1310 Hypertensive heart and chronic kidney disease without heart failure, with stage 1 through stage 4 chronic kidney disease, or unspecified chronic kidney disease: Secondary | ICD-10-CM | POA: Diagnosis not present

## 2024-01-25 DIAGNOSIS — F419 Anxiety disorder, unspecified: Secondary | ICD-10-CM | POA: Diagnosis not present

## 2024-01-25 DIAGNOSIS — E7800 Pure hypercholesterolemia, unspecified: Secondary | ICD-10-CM | POA: Diagnosis not present

## 2024-01-25 DIAGNOSIS — M5416 Radiculopathy, lumbar region: Secondary | ICD-10-CM | POA: Diagnosis not present

## 2024-01-25 DIAGNOSIS — Z91148 Patient's other noncompliance with medication regimen for other reason: Secondary | ICD-10-CM | POA: Diagnosis not present

## 2024-01-27 DIAGNOSIS — R262 Difficulty in walking, not elsewhere classified: Secondary | ICD-10-CM | POA: Diagnosis not present

## 2024-01-27 DIAGNOSIS — N182 Chronic kidney disease, stage 2 (mild): Secondary | ICD-10-CM | POA: Diagnosis not present

## 2024-01-27 DIAGNOSIS — M5416 Radiculopathy, lumbar region: Secondary | ICD-10-CM | POA: Diagnosis not present

## 2024-01-27 DIAGNOSIS — I1310 Hypertensive heart and chronic kidney disease without heart failure, with stage 1 through stage 4 chronic kidney disease, or unspecified chronic kidney disease: Secondary | ICD-10-CM | POA: Diagnosis not present

## 2024-01-27 DIAGNOSIS — M48062 Spinal stenosis, lumbar region with neurogenic claudication: Secondary | ICD-10-CM | POA: Diagnosis not present

## 2024-01-27 DIAGNOSIS — I739 Peripheral vascular disease, unspecified: Secondary | ICD-10-CM | POA: Diagnosis not present

## 2024-01-30 ENCOUNTER — Telehealth: Payer: Self-pay

## 2024-01-30 ENCOUNTER — Other Ambulatory Visit: Payer: Self-pay

## 2024-01-30 NOTE — Progress Notes (Signed)
 Complex Care Management Care Guide Note  01/30/2024 Name: Chad Miller MRN: [id number] DOB: 04/12/1941  Chad Miller is a 82 y.o. year old male who is a primary care patient of Jarold Medici, MD and is actively engaged with the care management team. I reached out to Chesley Sherry by phone today to assist with provider appointment scheduling  with the RN Case Manager.  Follow up plan: Confirmed telephonic appointment scheduled today 01/30/24 at 11:00 a.m. with Clayborne Ly, RNCM.   Dreama [facility]  [facility], [facility] VBCI Assistant Direct Dial: [phone]  Fax: [phone]

## 2024-01-30 NOTE — Patient Outreach (Addendum)
 Complex Care Management   Visit Note  01/30/2024  Name:  Chad Miller MRN: [id number] DOB: 12/02/1940  Situation: Referral received for Complex Care Management related to Primary Hypertension, Chronic Constipation, Recurrent Falls, Hearing loss, Vertigo, Blindness in right eye. I obtained verbal consent from Patient.  Visit completed with Patient on the phone.  Background:   Past Medical History:  Diagnosis Date   High cholesterol     Assessment: Patient Reported Symptoms:  Cognitive Cognitive Status: Alert and oriented to person, place, and time, Normal speech and language skills Cognitive/Intellectual Conditions Management [RPT]: None reported or documented in medical history or problem list   Health Maintenance Behaviors: Annual physical exam, Exercise, Healthy diet, Immunizations  Neurological Neurological Review of Symptoms: Dizziness Neurological Management Strategies: Routine screening Neurological Self-Management Outcome: 4 (good)  HEENT HEENT Symptoms Reported: Change or loss of hearing HEENT Management Strategies: Routine screening HEENT Self-Management Outcome: 4 (good)    Cardiovascular Cardiovascular Symptoms Reported: No symptoms reported Does patient have uncontrolled Hypertension?: No Cardiovascular Management Strategies: Medication therapy, Routine screening Cardiovascular Self-Management Outcome: 4 (good)  Respiratory Respiratory Symptoms Reported: No symptoms reported    Endocrine Endocrine Symptoms Reported: No symptoms reported    Gastrointestinal Gastrointestinal Symptoms Reported: Constipation Gastrointestinal Management Strategies: Diet modification, Medication therapy Gastrointestinal Self-Management Outcome: 3 (uncertain)    Genitourinary Genitourinary Symptoms Reported: Frequency, Urgency Genitourinary Management Strategies: Fluid modification Genitourinary Self-Management Outcome: 3 (uncertain)  Integumentary Integumentary Symptoms Reported:  No symptoms reported    Musculoskeletal Musculoskelatal Symptoms Reviewed: Difficulty walking, Unsteady gait, Limited mobility Musculoskeletal Management Strategies: Medical device, Routine screening Musculoskeletal Self-Management Outcome: 3 (uncertain) Musculoskeletal Comment: using a walker most of the time; patient is working with in home PT weekly Falls in the past year?: Yes Number of falls in past year: 2 or more Was there an injury with Fall?: No Fall Risk Category Calculator: 2 Patient Fall Risk Level: Moderate Fall Risk Patient at Risk for Falls Due to: History of fall(s), Impaired balance/gait, Impaired mobility Fall risk Follow up: Falls evaluation completed, Education provided, Falls prevention discussed  Psychosocial Psychosocial Symptoms Reported: No symptoms reported   Major Change/Loss/Stressor/Fears (CP): Medical condition, self Techniques to Cope with Loss/Stress/Change: Diversional activities (support system) Quality of Family Relationships: supportive, involved, helpful Do you feel physically threatened by others?: No    01/30/2024    PHQ2-9 Depression Screening   Shiquita Collignon interest or pleasure in doing things Not at all  Feeling down, depressed, or hopeless Not at all  PHQ-2 - Total Score 0  Trouble falling or staying asleep, or sleeping too much    Feeling tired or having Nyela Cortinas energy    Poor appetite or overeating     Feeling bad about yourself - or that you are a failure or have let yourself or your family down    Trouble concentrating on things, such as reading the newspaper or watching television    Moving or speaking so slowly that other people could have noticed.  Or the opposite - being so fidgety or restless that you have been moving around a lot more than usual    Thoughts that you would be better off dead, or hurting yourself in some way    PHQ2-9 Total Score    If you checked off any problems, how difficult have these problems made it for you to do  your work, take care of things at home, or get along with other people    Depression Interventions/Treatment      There were no  vitals filed for this visit.  Medications Reviewed Today     Reviewed by Morgan Clayborne CROME, RN (Registered Nurse) on 01/30/24 at 1147  Med List Status: <None>   Medication Order Taking? Sig Documenting Provider Last Dose Status Informant  acetaminophen (TYLENOL) 325 MG tablet [id number] Yes Take 650 mg by mouth every 6 (six) hours as needed. [provider]  Active   amLODipine  (NORVASC ) 5 MG tablet [id number] Yes TAKE 1 TABLET EVERY EVENING Sanders, Robyn, MD  Active   atorvastatin  (LIPITOR) 10 MG tablet [id number] Yes TAKE 1 TABLET DAILY Jarold Medici, MD  Active   mirtazapine  (REMERON ) 7.5 MG tablet [id number] Yes Take 1 tablet (7.5 mg total) by mouth at bedtime. Jarold Medici, MD  Active   mupirocin  ointment (BACTROBAN ) 2 % [id number]  Apply 1 Application topically 2 (two) times daily.  Patient not taking: Reported on 01/30/2024   Jarold Medici, MD  Active   nebivolol  (BYSTOLIC ) 10 MG tablet [id number] Yes TAKE 1 TABLET EVERY        MORNING. DISCONTINUE       METOPROLOL  Jarold Medici, MD  Active   Nutritional Supplements (EQUATE) LIQD [id number] Yes Take by mouth daily. [provider]  Active   olmesartan  (BENICAR ) 20 MG tablet [id number] Yes TAKE 1 TABLET EVERY MORNING Jarold Medici, MD  Active             Recommendation:   Continue Current Plan of Care  Follow Up Plan:   Telephone follow up appointment date/time:  Friday, October 10 at 09:30 AM  Clayborne Morgan RN BSN CCM [place]  [facility], [facility] Health Nurse Care Coordinator  Direct Dial: [phone] Website: [email or web address]

## 2024-02-02 ENCOUNTER — Other Ambulatory Visit (INDEPENDENT_AMBULATORY_CARE_PROVIDER_SITE_OTHER): Payer: Self-pay | Admitting: Internal Medicine

## 2024-02-02 DIAGNOSIS — M48062 Spinal stenosis, lumbar region with neurogenic claudication: Secondary | ICD-10-CM

## 2024-02-02 DIAGNOSIS — I739 Peripheral vascular disease, unspecified: Secondary | ICD-10-CM | POA: Diagnosis not present

## 2024-02-02 DIAGNOSIS — I1310 Hypertensive heart and chronic kidney disease without heart failure, with stage 1 through stage 4 chronic kidney disease, or unspecified chronic kidney disease: Secondary | ICD-10-CM | POA: Diagnosis not present

## 2024-02-02 DIAGNOSIS — R269 Unspecified abnormalities of gait and mobility: Secondary | ICD-10-CM | POA: Diagnosis not present

## 2024-02-02 DIAGNOSIS — E7800 Pure hypercholesterolemia, unspecified: Secondary | ICD-10-CM

## 2024-02-02 DIAGNOSIS — N182 Chronic kidney disease, stage 2 (mild): Secondary | ICD-10-CM

## 2024-02-02 NOTE — Progress Notes (Signed)
 CC: HH CERTIFICATION  Received home health orders orders from [facility]. Start of care 12/26/23.   Certification and orders from 12/26/23 through 02/23/24 are reviewed, signed and faxed back to home health company.  Need of intermittent skilled services at home: PT  The home health care plan has been established by me and will be reviewed and updated as needed to maximize patient recovery.  I certify that all home health services have been and will be furnished to the patient while under my care.  Face-to-face encounter in which the need for home health services was established: 12/22/23  Patient is receiving home health services for the following diagnoses: Problem List Items Addressed This Visit       Cardiovascular and Mediastinum   Hypertensive heart and renal disease   Peripheral artery disease (HCC)     Genitourinary   Chronic renal disease, stage II     Other   Gait disturbance   Spinal stenosis of lumbar region   Pure hypercholesterolemia - Primary     Chad LOISE Slocumb, MD

## 2024-02-03 NOTE — Patient Instructions (Signed)
 Visit Information  Thank you for taking time to visit with me today. Please don't hesitate to contact me if I can be of assistance to you before our next scheduled appointment.  Our next appointment is by telephone on Friday, October 10 at 09:30 AM Please call the care guide team at [phone] if you need to cancel or reschedule your appointment.   Following is a copy of your care plan:   Goals Addressed             This Visit's Progress    VBCI RN Care Plan related to chronic pain with imparied physical mobility       Problems:  Chronic Disease Management support and education needs related to chronic pain with impaired physical mobility   Goal: Over the next 90 days the Patient will continue to work with Medical illustrator and/or Social Worker to address care management and care coordination needs related to chronic pain  as evidenced by adherence to care management team scheduled appointments      Interventions:   Pain Interventions: Pain assessment performed Medications reviewed Reviewed provider established plan for pain management Discussed importance of adherence to all scheduled medical appointments Counseled on the importance of reporting any/all new or changed pain symptoms or management strategies to pain management provider Advised patient to report to care team affect of pain on daily activities Discussed use of relaxation techniques and/or diversional activities to assist with pain reduction (distraction, imagery, relaxation, massage, acupressure, TENS, heat, and cold application Reviewed with patient prescribed pharmacological and nonpharmacological pain relief strategies  Patient Self-Care Activities:  Attend all scheduled provider appointments Call pharmacy for medication refills 3-7 days in advance of running out of medications Call provider office for new concerns or questions  Take medications as prescribed   Use your DME at all times  Work with the nurse  care manager to address care coordination needs and will continue to work with the clinical team to address health care and disease management related needs  Plan:  Continue working with your in home PT for help with strengthening, balance and endurance  Perform your home exercises daily as tolerated and as directed by PT Telephone follow up appointment with care management team member scheduled for:  Friday, October 10 at 09:30 AM          VBCI RN Care Plan related to Falls       Problems:  Chronic Disease Management support and education needs related to Falls   Goal: Over the next 90 days the Patient will continue to work with Medical illustrator and/or Social Worker to address care management and care coordination needs related to falls  as evidenced by adherence to care management team scheduled appointments      Interventions:   Falls Interventions: Provided written and verbal education re: potential causes of falls and Fall prevention strategies Reviewed medications and discussed potential side effects of medications such as dizziness and frequent urination Advised patient of importance of notifying provider of falls Assessed for signs and symptoms of orthostatic hypotension Assessed working status of life alert bracelet and patient adherence Screening for signs and symptoms of depression related to chronic disease state  Assessed social determinant of health barriers  Patient Self-Care Activities:  Attend all scheduled provider appointments Call pharmacy for medication refills 3-7 days in advance of running out of medications Call provider office for new concerns or questions  Take medications as prescribed   Work with the nurse care  manager to address care coordination needs and will continue to work with the clinical team to address health care and disease management related needs  Plan:  Next PCP appointment scheduled for: Wednesday, October 15 at 2:00 PM Telephone follow  up appointment with care management team member scheduled for: Friday, October 10 at 9:30 AM         Please call 1-800-273-TALK (toll free, 24 hour hotline) if you are experiencing a Mental Health or Behavioral Health Crisis or need someone to talk to.  Patient verbalizes understanding of instructions and care plan provided today and agrees to view in MyChart. Active MyChart status and patient understanding of how to access instructions and care plan via MyChart confirmed with patient.     Clayborne Ly RN BSN CCM [place]  [facility], [facility] Health Nurse Care Coordinator  Direct Dial: [phone] Website: [email or web address]

## 2024-02-04 DIAGNOSIS — M48062 Spinal stenosis, lumbar region with neurogenic claudication: Secondary | ICD-10-CM | POA: Diagnosis not present

## 2024-02-04 DIAGNOSIS — M5416 Radiculopathy, lumbar region: Secondary | ICD-10-CM | POA: Diagnosis not present

## 2024-02-04 DIAGNOSIS — I739 Peripheral vascular disease, unspecified: Secondary | ICD-10-CM | POA: Diagnosis not present

## 2024-02-04 DIAGNOSIS — N182 Chronic kidney disease, stage 2 (mild): Secondary | ICD-10-CM | POA: Diagnosis not present

## 2024-02-04 DIAGNOSIS — I1310 Hypertensive heart and chronic kidney disease without heart failure, with stage 1 through stage 4 chronic kidney disease, or unspecified chronic kidney disease: Secondary | ICD-10-CM | POA: Diagnosis not present

## 2024-02-04 DIAGNOSIS — R262 Difficulty in walking, not elsewhere classified: Secondary | ICD-10-CM | POA: Diagnosis not present

## 2024-02-13 DIAGNOSIS — I739 Peripheral vascular disease, unspecified: Secondary | ICD-10-CM | POA: Diagnosis not present

## 2024-02-13 DIAGNOSIS — M48062 Spinal stenosis, lumbar region with neurogenic claudication: Secondary | ICD-10-CM | POA: Diagnosis not present

## 2024-02-13 DIAGNOSIS — N182 Chronic kidney disease, stage 2 (mild): Secondary | ICD-10-CM | POA: Diagnosis not present

## 2024-02-13 DIAGNOSIS — R262 Difficulty in walking, not elsewhere classified: Secondary | ICD-10-CM | POA: Diagnosis not present

## 2024-02-13 DIAGNOSIS — M5416 Radiculopathy, lumbar region: Secondary | ICD-10-CM | POA: Diagnosis not present

## 2024-02-13 DIAGNOSIS — I1310 Hypertensive heart and chronic kidney disease without heart failure, with stage 1 through stage 4 chronic kidney disease, or unspecified chronic kidney disease: Secondary | ICD-10-CM | POA: Diagnosis not present

## 2024-02-20 DIAGNOSIS — M5416 Radiculopathy, lumbar region: Secondary | ICD-10-CM | POA: Diagnosis not present

## 2024-02-20 DIAGNOSIS — I739 Peripheral vascular disease, unspecified: Secondary | ICD-10-CM | POA: Diagnosis not present

## 2024-02-20 DIAGNOSIS — N182 Chronic kidney disease, stage 2 (mild): Secondary | ICD-10-CM | POA: Diagnosis not present

## 2024-02-20 DIAGNOSIS — R262 Difficulty in walking, not elsewhere classified: Secondary | ICD-10-CM | POA: Diagnosis not present

## 2024-02-20 DIAGNOSIS — M48062 Spinal stenosis, lumbar region with neurogenic claudication: Secondary | ICD-10-CM | POA: Diagnosis not present

## 2024-02-20 DIAGNOSIS — I1310 Hypertensive heart and chronic kidney disease without heart failure, with stage 1 through stage 4 chronic kidney disease, or unspecified chronic kidney disease: Secondary | ICD-10-CM | POA: Diagnosis not present

## 2024-02-24 DIAGNOSIS — Z9181 History of falling: Secondary | ICD-10-CM | POA: Diagnosis not present

## 2024-02-24 DIAGNOSIS — M5416 Radiculopathy, lumbar region: Secondary | ICD-10-CM | POA: Diagnosis not present

## 2024-02-24 DIAGNOSIS — I1310 Hypertensive heart and chronic kidney disease without heart failure, with stage 1 through stage 4 chronic kidney disease, or unspecified chronic kidney disease: Secondary | ICD-10-CM | POA: Diagnosis not present

## 2024-02-24 DIAGNOSIS — R262 Difficulty in walking, not elsewhere classified: Secondary | ICD-10-CM | POA: Diagnosis not present

## 2024-02-24 DIAGNOSIS — E7800 Pure hypercholesterolemia, unspecified: Secondary | ICD-10-CM | POA: Diagnosis not present

## 2024-02-24 DIAGNOSIS — Z6820 Body mass index (BMI) 20.0-20.9, adult: Secondary | ICD-10-CM | POA: Diagnosis not present

## 2024-02-24 DIAGNOSIS — R634 Abnormal weight loss: Secondary | ICD-10-CM | POA: Diagnosis not present

## 2024-02-24 DIAGNOSIS — Z91148 Patient's other noncompliance with medication regimen for other reason: Secondary | ICD-10-CM | POA: Diagnosis not present

## 2024-02-24 DIAGNOSIS — I739 Peripheral vascular disease, unspecified: Secondary | ICD-10-CM | POA: Diagnosis not present

## 2024-02-24 DIAGNOSIS — K5909 Other constipation: Secondary | ICD-10-CM | POA: Diagnosis not present

## 2024-02-24 DIAGNOSIS — F419 Anxiety disorder, unspecified: Secondary | ICD-10-CM | POA: Diagnosis not present

## 2024-02-24 DIAGNOSIS — N182 Chronic kidney disease, stage 2 (mild): Secondary | ICD-10-CM | POA: Diagnosis not present

## 2024-02-24 DIAGNOSIS — M48062 Spinal stenosis, lumbar region with neurogenic claudication: Secondary | ICD-10-CM | POA: Diagnosis not present

## 2024-02-25 DIAGNOSIS — M5416 Radiculopathy, lumbar region: Secondary | ICD-10-CM | POA: Diagnosis not present

## 2024-02-25 DIAGNOSIS — R262 Difficulty in walking, not elsewhere classified: Secondary | ICD-10-CM | POA: Diagnosis not present

## 2024-02-25 DIAGNOSIS — N182 Chronic kidney disease, stage 2 (mild): Secondary | ICD-10-CM | POA: Diagnosis not present

## 2024-02-25 DIAGNOSIS — I739 Peripheral vascular disease, unspecified: Secondary | ICD-10-CM | POA: Diagnosis not present

## 2024-02-25 DIAGNOSIS — M48062 Spinal stenosis, lumbar region with neurogenic claudication: Secondary | ICD-10-CM | POA: Diagnosis not present

## 2024-02-25 DIAGNOSIS — I1310 Hypertensive heart and chronic kidney disease without heart failure, with stage 1 through stage 4 chronic kidney disease, or unspecified chronic kidney disease: Secondary | ICD-10-CM | POA: Diagnosis not present

## 2024-02-27 ENCOUNTER — Other Ambulatory Visit: Payer: Self-pay

## 2024-02-27 NOTE — Patient Outreach (Signed)
 Complex Care Management   Visit Note  02/27/2024  Name:  Chad Miller MRN: [id number] DOB: January 20, 1941  Situation: Referral received for Complex Care Management related to Primary Hypertension, Chronic Constipation, Recurrent Falls, Hearing loss, Vertigo, Blindness in right eye, Chronic low back pain, Chronic left knee pain. I obtained verbal consent from Patient.  Visit completed with Patient on the phone.  Background:   Past Medical History:  Diagnosis Date   High cholesterol     Assessment: Patient Reported Symptoms:  Cognitive Cognitive Status: Alert and oriented to person, place, and time, Normal speech and language skills Cognitive/Intellectual Conditions Management [RPT]: None reported or documented in medical history or problem list   Health Maintenance Behaviors: Annual physical exam, Exercise, Healthy diet Health Facilitated by: Healthy diet, Rest  Neurological Neurological Review of Symptoms: No symptoms reported    HEENT HEENT Symptoms Reported: No symptoms reported      Cardiovascular Cardiovascular Symptoms Reported: No symptoms reported Does patient have uncontrolled Hypertension?: No    Respiratory Respiratory Symptoms Reported: No symptoms reported    Endocrine Endocrine Symptoms Reported: No symptoms reported Is patient diabetic?: No    Gastrointestinal Gastrointestinal Symptoms Reported: Constipation Gastrointestinal Management Strategies: Diet modification, Fluid modification Gastrointestinal Self-Management Outcome: 4 (good)    Genitourinary Genitourinary Symptoms Reported: Frequency Genitourinary Management Strategies: Fluid modification Genitourinary Self-Management Outcome: 4 (good)  Integumentary Integumentary Symptoms Reported: No symptoms reported    Musculoskeletal Musculoskelatal Symptoms Reviewed: Limited mobility, Unsteady gait, Back pain, Joint pain Additional Musculoskeletal Details: left knee pain, low back pain, balance issues,  continues to work with PT, performs HEP some days when feeling up to it Musculoskeletal Management Strategies: Adequate rest, Exercise, Routine screening Musculoskeletal Self-Management Outcome: 4 (good) Falls in the past year?: Yes Number of falls in past year: 1 or less Was there an injury with Fall?: Yes Fall Risk Category Calculator: 2 Patient Fall Risk Level: Moderate Fall Risk Patient at Risk for Falls Due to: Impaired balance/gait, Impaired mobility Fall risk Follow up: Education provided, Falls evaluation completed, Falls prevention discussed  Psychosocial Psychosocial Symptoms Reported: No symptoms reported   Major Change/Loss/Stressor/Fears (CP): Medical condition, self Techniques to Cope with Loss/Stress/Change: Diversional activities Quality of Family Relationships: supportive, involved, helpful Do you feel physically threatened by others?: No    02/27/2024    PHQ2-9 Depression Screening   Chad Miller interest or pleasure in doing things    Feeling down, depressed, or hopeless    PHQ-2 - Total Score    Trouble falling or staying asleep, or sleeping too much    Feeling tired or having Chad Miller energy    Poor appetite or overeating     Feeling bad about yourself - or that you are a failure or have let yourself or your family down    Trouble concentrating on things, such as reading the newspaper or watching television    Moving or speaking so slowly that other people could have noticed.  Or the opposite - being so fidgety or restless that you have been moving around a lot more than usual    Thoughts that you would be better off dead, or hurting yourself in some way    PHQ2-9 Total Score    If you checked off any problems, how difficult have these problems made it for you to do your work, take care of things at home, or get along with other people    Depression Interventions/Treatment      There were no vitals filed for this visit.  Medications Reviewed Today     Reviewed by  Morgan Clayborne CROME, RN (Registered Nurse) on 02/27/24 at [phone]  Med List Status: <None>   Medication Order Taking? Sig Documenting Provider Last Dose Status Informant  acetaminophen (TYLENOL) 325 MG tablet [id number]  Take 650 mg by mouth every 6 (six) hours as needed. [provider]  Active   amLODipine  (NORVASC ) 5 MG tablet [id number]  TAKE 1 TABLET EVERY KARNA Chad Medici, MD  Active   atorvastatin  (LIPITOR) 10 MG tablet [id number]  TAKE 1 TABLET DAILY Chad Medici, MD  Active   mirtazapine  (REMERON ) 7.5 MG tablet [id number] Yes Take 1 tablet (7.5 mg total) by mouth at bedtime.  Patient taking differently: Take 1 tablet (7.5 mg total) by mouth at bedtime.   Chad Medici, MD  Active   mupirocin  ointment (BACTROBAN ) 2 % [id number]  Apply 1 Application topically 2 (two) times daily.  Patient not taking: Reported on 01/30/2024   Chad Medici, MD  Active   nebivolol  (BYSTOLIC ) 10 MG tablet [id number]  TAKE 1 TABLET EVERY        MORNING. DISCONTINUE       METOPROLOL  Chad Medici, MD  Active   Nutritional Supplements (EQUATE) LIQD [id number]  Take by mouth daily. [provider]  Active   olmesartan  (BENICAR ) 20 MG tablet [id number]  TAKE 1 TABLET EVERY MORNING Chad Medici, MD  Active             Recommendation:   PCP Follow-up with Dr. Medici Chad for a weight check on 03/03/24 at 2:00 PM  Follow Up Plan:   Telephone follow up appointment date/time:  Wednesday, October 29 at 11:15 AM  Clayborne Morgan RN BSN CCM [place]  [facility], [facility] Health Nurse Care Coordinator  Direct Dial: [phone] Website: [email or web address]

## 2024-02-27 NOTE — Patient Instructions (Signed)
 Visit Information  Thank you for taking time to visit with me today. Please don't hesitate to contact me if I can be of assistance to you before our next scheduled appointment.  Your next care management appointment is by telephone on Wednesday, October 29 at 11:15 AM  Please call the care guide team at [phone] if you need to cancel, schedule, or reschedule an appointment.   Please call 1-800-273-TALK (toll free, 24 hour hotline) if you are experiencing a Mental Health or Behavioral Health Crisis or need someone to talk to.  Clayborne Ly RN BSN CCM [place]  [facility], [facility] Health Nurse Care Coordinator  Direct Dial: [phone] Website: [email or web address]

## 2024-03-02 DIAGNOSIS — N182 Chronic kidney disease, stage 2 (mild): Secondary | ICD-10-CM | POA: Diagnosis not present

## 2024-03-02 DIAGNOSIS — I739 Peripheral vascular disease, unspecified: Secondary | ICD-10-CM | POA: Diagnosis not present

## 2024-03-02 DIAGNOSIS — R262 Difficulty in walking, not elsewhere classified: Secondary | ICD-10-CM | POA: Diagnosis not present

## 2024-03-02 DIAGNOSIS — M5416 Radiculopathy, lumbar region: Secondary | ICD-10-CM | POA: Diagnosis not present

## 2024-03-02 DIAGNOSIS — I1310 Hypertensive heart and chronic kidney disease without heart failure, with stage 1 through stage 4 chronic kidney disease, or unspecified chronic kidney disease: Secondary | ICD-10-CM | POA: Diagnosis not present

## 2024-03-02 DIAGNOSIS — M48062 Spinal stenosis, lumbar region with neurogenic claudication: Secondary | ICD-10-CM | POA: Diagnosis not present

## 2024-03-03 ENCOUNTER — Ambulatory Visit: Admitting: Internal Medicine

## 2024-03-03 ENCOUNTER — Encounter: Payer: Self-pay | Admitting: Internal Medicine

## 2024-03-03 VITALS — BP 132/80 | HR 60 | Temp 98.0°F | Ht 70.0 in | Wt 144.2 lb

## 2024-03-03 DIAGNOSIS — I1310 Hypertensive heart and chronic kidney disease without heart failure, with stage 1 through stage 4 chronic kidney disease, or unspecified chronic kidney disease: Secondary | ICD-10-CM | POA: Diagnosis not present

## 2024-03-03 DIAGNOSIS — R634 Abnormal weight loss: Secondary | ICD-10-CM

## 2024-03-03 DIAGNOSIS — I739 Peripheral vascular disease, unspecified: Secondary | ICD-10-CM

## 2024-03-03 DIAGNOSIS — Z9989 Dependence on other enabling machines and devices: Secondary | ICD-10-CM

## 2024-03-03 DIAGNOSIS — Z23 Encounter for immunization: Secondary | ICD-10-CM | POA: Diagnosis not present

## 2024-03-03 DIAGNOSIS — N182 Chronic kidney disease, stage 2 (mild): Secondary | ICD-10-CM | POA: Diagnosis not present

## 2024-03-03 DIAGNOSIS — D72819 Decreased white blood cell count, unspecified: Secondary | ICD-10-CM

## 2024-03-03 NOTE — Progress Notes (Signed)
 I,Victoria T Emmitt, CMA,acting as a neurosurgeon for Catheryn LOISE Slocumb, MD.,have documented all relevant documentation on the behalf of Catheryn LOISE Slocumb, MD,as directed by  Catheryn LOISE Slocumb, MD while in the presence of Catheryn LOISE Slocumb, MD.  Subjective:  Patient ID: Chad Miller , male    DOB: 01-14-1941 , 83 y.o.   MRN: [id number]  Chief Complaint  Patient presents with   Weight Check    Patient presents today for weight check. He is accompanied by his wife. He reports compliance with medications. Denies headache, chest pain & sob.      HPI Discussed the use of AI scribe software for clinical note transcription with the patient, who gave verbal consent to proceed.  History of Present Illness Chad Miller is an 83 year old male with hypertension who presents for follow-up on blood pressure and weight management.  He is accompanied by his wife.  He has gained two pounds since his last visit and is dissatisfied with his current weight. Despite having a good appetite, he has not lost weight and is attempting to eat more. He did not eat breakfast on the day of the visit and is often outside engaging in activities, which may contribute to his weight maintenance challenges. He is focused on increasing his protein intake, which he believes may have contributed to his weight gain. He does not currently take a multivitamin.  He is currently taking amlodipine , atorvastatin , olmesartan , and bisoprolol for his blood pressure. Mirtazapine  was prescribed in August to help with appetite, but he only filled it once due to concerns about potential side effects, including mood and sleep disturbances. He decided not to continue with it, opting instead to try to increase his food intake naturally.   Hypertension This is a chronic problem. The current episode started more than 1 year ago. The problem has been gradually improving since onset. The problem is controlled. Pertinent negatives include no blurred vision. Risk  factors for coronary artery disease include dyslipidemia and male gender. Past treatments include angiotensin blockers. Hypertensive end-organ damage includes kidney disease.     Past Medical History:  Diagnosis Date   Arthritis    Cataract    Glaucoma    High cholesterol    Hypertension      Family History  Problem Relation Age of Onset   Heart disease Mother    Lung cancer Father    Asthma Sister    Heart disease Brother    Heart disease Brother    Allergic rhinitis Neg Hx    Eczema Neg Hx    Urticaria Neg Hx      Current Outpatient Medications:    acetaminophen (TYLENOL) 325 MG tablet, Take 650 mg by mouth every 6 (six) hours as needed., Disp: , Rfl:    amLODipine  (NORVASC ) 5 MG tablet, TAKE 1 TABLET EVERY EVENING, Disp: 90 tablet, Rfl: 3   atorvastatin  (LIPITOR) 10 MG tablet, TAKE 1 TABLET DAILY, Disp: 90 tablet, Rfl: 2   mirtazapine  (REMERON ) 7.5 MG tablet, Take 1 tablet (7.5 mg total) by mouth at bedtime. (Patient taking differently: Take 1 tablet (7.5 mg total) by mouth at bedtime.), Disp: 30 tablet, Rfl: 1   nebivolol  (BYSTOLIC ) 10 MG tablet, TAKE 1 TABLET EVERY        MORNING. DISCONTINUE       METOPROLOL , Disp: 90 tablet, Rfl: 2   Nutritional Supplements (EQUATE) LIQD, Take by mouth daily., Disp: , Rfl:    olmesartan  (BENICAR ) 20 MG tablet, TAKE 1  TABLET EVERY MORNING, Disp: 90 tablet, Rfl: 2   mupirocin  ointment (BACTROBAN ) 2 %, Apply 1 Application topically 2 (two) times daily. (Patient not taking: Reported on 03/03/2024), Disp: 30 g, Rfl: 0   No Known Allergies   Review of Systems  Constitutional: Negative.   HENT: Negative.    Eyes:  Negative for blurred vision.  Respiratory: Negative.    Gastrointestinal: Negative.   Skin: Negative.   Hematological: Negative.      Today's Vitals   03/03/24 1409  BP: 132/80  Pulse: 60  Temp: 98 F (36.7 C)  SpO2: 98%  Weight: 144 lb 3.2 oz (65.4 kg)  Height: 5' 10 (1.778 m)   Body mass index is 20.69 kg/m.  Wt  Readings from Last 3 Encounters:  03/09/24 144 lb (65.3 kg)  03/03/24 144 lb 3.2 oz (65.4 kg)  12/22/23 142 lb (64.4 kg)     Objective:  Physical Exam Vitals and nursing note reviewed.  Constitutional:      Appearance: Normal appearance.  HENT:     Head: Normocephalic and atraumatic.  Eyes:     Extraocular Movements: Extraocular movements intact.  Cardiovascular:     Rate and Rhythm: Normal rate and regular rhythm.     Heart sounds: Normal heart sounds.  Pulmonary:     Effort: Pulmonary effort is normal.     Breath sounds: Normal breath sounds.  Musculoskeletal:     Comments: Uses walker for ambulation  Skin:    General: Skin is warm.  Neurological:     General: No focal deficit present.     Mental Status: He is alert.  Psychiatric:        Mood and Affect: Mood normal.         Assessment And Plan:  Unintentional weight loss Assessment & Plan: Unfortunately, he did not start mirtazapine  as discussed at his last visit.  Weight gain of two pounds, appetite good. Mirtazapine  not taken due to side effect concerns. Discussed mirtazapine  risks and benefits, serious side effects rare. - Encourage increased caloric intake with snacks post-activity. - Consider multivitamin for seniors, avoid Centrum and One A Day. - Recheck blood count for nutritional deficiencies.  Orders: -     CBC  Hypertensive heart and renal disease with renal failure, stage 1 through stage 4 or unspecified chronic kidney disease, without heart failure [I13.10] Assessment & Plan: Chronic, controlled.  He will continue with nebivolol  and olmesartan  in the mornings. He will continue with amlodipine  in the evenings.  Encouraged to follow low sodium diet. He will f/u in 6 months for re-evaluation.   Orders: -     BMP8+EGFR  Peripheral artery disease (HCC) [I73.9] Assessment & Plan: Chronic, encouraged to increase daily activity. He is reminded chair exercises count as well.  He is on statin therapy.  Cardiology input is appreciated.    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 He is encouraged to keep BP controlled and stay well hydrated to prevent progression of CKD.    Immunization due -     Flu vaccine HIGH DOSE PF(Fluzone Trivalent)  Walker as ambulation aid   Return if symptoms worsen or fail to improve.  Patient was given opportunity to ask questions. Patient verbalized understanding of the plan and was able to repeat key elements of the plan. All questions were answered to their satisfaction.   I, Catheryn LOISE Slocumb, MD, have reviewed all documentation for this visit. The documentation  on 03/03/24 for the exam, diagnosis, procedures, and orders are all accurate and complete.   IF YOU HAVE BEEN REFERRED TO A SPECIALIST, IT MAY TAKE 1-2 WEEKS TO SCHEDULE/PROCESS THE REFERRAL. IF YOU HAVE NOT HEARD FROM US /SPECIALIST IN TWO WEEKS, PLEASE GIVE US  A CALL AT [phone].   THE PATIENT IS ENCOURAGED TO PRACTICE SOCIAL DISTANCING DUE TO THE COVID-19 PANDEMIC.

## 2024-03-04 ENCOUNTER — Ambulatory Visit: Payer: Self-pay | Admitting: Internal Medicine

## 2024-03-04 LAB — BMP8+EGFR
BUN/Creatinine Ratio: 15 (ref 10–24)
BUN: 13 mg/dL (ref 8–27)
CO2: 24 mmol/L (ref 20–29)
Calcium: 9.3 mg/dL (ref 8.6–10.2)
Chloride: 107 mmol/L — ABNORMAL HIGH (ref 96–106)
Creatinine, Ser: 0.89 mg/dL (ref 0.76–1.27)
Glucose: 87 mg/dL (ref 70–99)
Potassium: 4.1 mmol/L (ref 3.5–5.2)
Sodium: 143 mmol/L (ref 134–144)
eGFR: 85 mL/min/1.73 (ref >59)

## 2024-03-04 LAB — CBC
Hematocrit: 39.7 % (ref 37.5–51.0)
Hemoglobin: 13.3 g/dL (ref 13.0–17.7)
MCH: 33.7 pg — ABNORMAL HIGH (ref 26.6–33.0)
MCHC: 33.5 g/dL (ref 31.5–35.7)
MCV: 101 fL — ABNORMAL HIGH (ref 79–97)
Platelets: 183 x10E3/uL (ref 150–450)
RBC: 3.95 x10E6/uL — ABNORMAL LOW (ref 4.14–5.80)
RDW: 12.8 % (ref 11.6–15.4)
WBC: 3 x10E3/uL — ABNORMAL LOW (ref 3.4–10.8)

## 2024-03-09 ENCOUNTER — Ambulatory Visit (INDEPENDENT_AMBULATORY_CARE_PROVIDER_SITE_OTHER)

## 2024-03-09 VITALS — BP 130/80 | HR 78 | Temp 98.5°F | Ht 70.0 in | Wt 144.0 lb

## 2024-03-09 DIAGNOSIS — Z0000 Encounter for general adult medical examination without abnormal findings: Secondary | ICD-10-CM | POA: Diagnosis not present

## 2024-03-09 NOTE — Progress Notes (Signed)
 Subjective:   Chad Miller is a 83 y.o. male who presents for Medicare Annual/Subsequent preventive examination.  Visit Complete: In person  Patient Medicare AWV questionnaire was completed by the patient on 03/09/2024; I have confirmed that all information answered by patient is correct and no changes since this date.        Objective:    There were no vitals filed for this visit. There is no height or weight on file to calculate BMI.     05/16/2022    3:10 PM 05/08/2022   10:05 AM 07/02/2021    8:58 AM 05/02/2021   12:21 PM 11/02/2020   10:03 AM 04/12/2020   10:28 AM 04/01/2019    2:30 PM  Advanced Directives  Does Patient Have a Medical Advance Directive? Yes Yes Yes Yes Yes Yes Yes  Type of Estate agent of [place];Living will Living will;Healthcare Power of [employer] Power of [place];Living will Healthcare Power of [place];Living will Living will;Healthcare Power of [employer] Power of [place];Living will Healthcare Power of [place];Living will  Does patient want to make changes to medical advance directive?  No - Patient declined   No - Patient declined    Copy of Healthcare Power of Attorney in Chart? No - copy requested No - copy requested No - copy requested No - copy requested No - copy requested No - copy requested No - copy requested    Current Medications (verified) Outpatient Encounter Medications as of 03/09/2024  Medication Sig   acetaminophen (TYLENOL) 325 MG tablet Take 650 mg by mouth every 6 (six) hours as needed.   amLODipine  (NORVASC ) 5 MG tablet TAKE 1 TABLET EVERY EVENING   atorvastatin  (LIPITOR) 10 MG tablet TAKE 1 TABLET DAILY   mirtazapine  (REMERON ) 7.5 MG tablet Take 1 tablet (7.5 mg total) by mouth at bedtime. (Patient taking differently: Take 1 tablet (7.5 mg total) by mouth at bedtime.)   mupirocin  ointment (BACTROBAN ) 2 % Apply 1 Application topically 2 (two) times daily. (Patient not taking:  Reported on 03/03/2024)   nebivolol  (BYSTOLIC ) 10 MG tablet TAKE 1 TABLET EVERY        MORNING. DISCONTINUE       METOPROLOL    Nutritional Supplements (EQUATE) LIQD Take by mouth daily.   olmesartan  (BENICAR ) 20 MG tablet TAKE 1 TABLET EVERY MORNING   No facility-administered encounter medications on file as of 03/09/2024.    Allergies (verified) Patient has no known allergies.   History: Past Medical History:  Diagnosis Date   High cholesterol    Past Surgical History:  Procedure Laterality Date   CATARACT EXTRACTION     REPLACEMENT TOTAL KNEE Left 2004   Family History  Problem Relation Age of Onset   Heart disease Mother    Lung cancer Father    Asthma Sister    Heart disease Brother    Heart disease Brother    Allergic rhinitis Neg Hx    Eczema Neg Hx    Urticaria Neg Hx    Social History   Socioeconomic History   Marital status: Married    Spouse name: Not on file   Number of children: 2   Years of education: College   Highest education level: Master's degree (e.g., MA, MS, MEng, MEd, MSW, MBA)  Occupational History   Occupation: Retired  Tobacco Use   Smoking status: Never   Smokeless tobacco: Never  Vaping Use   Vaping status: Never Used  Substance and Sexual Activity   Alcohol use:  No    Comment: Quit 2015   Drug use: No   Sexual activity: Yes    Partners: Female    Comment: Married  Other Topics Concern   Not on file  Social History Narrative   Lives at home w/ his wife   Right-handed   Caffeine: 1 cup per day   Social Drivers of Health   Financial Resource Strain: Low Risk  (03/08/2024)   Overall Financial Resource Strain (CARDIA)    Difficulty of Paying Living Expenses: Not hard at all  Recent Concern: Financial Resource Strain - High Risk (01/08/2024)   Overall Financial Resource Strain (CARDIA)    Difficulty of Paying Living Expenses: Very hard  Food Insecurity: No Food Insecurity (03/08/2024)   Hunger Vital Sign    Worried About  Running Out of Food in the Last Year: Never true    Ran Out of Food in the Last Year: Never true  Transportation Needs: No Transportation Needs (03/08/2024)   PRAPARE - Administrator, Civil Service (Medical): No    Lack of Transportation (Non-Medical): No  Physical Activity: Insufficiently Active (03/08/2024)   Exercise Vital Sign    Days of Exercise per Week: 3 days    Minutes of Exercise per Session: 30 min  Stress: No Stress Concern Present (03/08/2024)   [employer] of Occupational Health - Occupational Stress Questionnaire    Feeling of Stress: Only a little  Recent Concern: Stress - Stress Concern Present (12/19/2023)   [employer] of Occupational Health - Occupational Stress Questionnaire    Feeling of Stress: To some extent  Social Connections: Moderately Isolated (03/08/2024)   Social Connection and Isolation Panel    Frequency of Communication with Friends and Family: More than three times a week    Frequency of Social Gatherings with Friends and Family: Never    Attends Religious Services: Never    Database administrator or Organizations: No    Attends Engineer, structural: Not on file    Marital Status: Married    Tobacco Counseling Counseling given: Not Answered   Clinical Intake:                        Activities of Daily Living    03/08/2024    5:55 PM  In your present state of health, do you have any difficulty performing the following activities:  Hearing? 1  Vision? 1  Difficulty concentrating or making decisions? 1  Walking or climbing stairs? 1  Dressing or bathing? 0  Doing errands, shopping? 0  Preparing Food and eating ? N  Using the Toilet? N  In the past six months, have you accidently leaked urine? Y  Do you have problems with loss of bowel control? N  Managing your Medications? N  Managing your Finances? N  Housekeeping or managing your Housekeeping? N    Patient Care Team: Jarold Medici, MD  as PCP - General (Internal Medicine) Morgan, Clayborne CROME, RN as VBCI Care Management  Indicate any recent Medical Services you may have received from other than [facility] providers in the past year (date may be approximate).     Assessment:   This is a routine wellness examination for Chad Miller.  Hearing/Vision screen No results found.   Goals Addressed   None    Depression Screen    03/03/2024    2:11 PM 01/30/2024   11:51 AM 12/22/2023   11:30 AM 11/03/2023    3:58  PM 09/19/2022    3:03 PM 06/17/2022   11:17 AM 05/16/2022    3:12 PM  PHQ 2/9 Scores  PHQ - 2 Score 0 0 0 0 0 0 0  PHQ- 9 Score 0  0 0 0      Fall Risk    03/08/2024    5:55 PM 03/03/2024    2:11 PM 02/27/2024   10:09 AM 01/30/2024   11:15 AM 11/03/2023    3:57 PM  Fall Risk   Falls in the past year? 1 1 1 1 1   Number falls in past yr: 1 0 0 1 1  Injury with Fall? 0 1 1 0 1  Risk for fall due to :  History of fall(s) Impaired balance/gait;Impaired mobility History of fall(s);Impaired balance/gait;Impaired mobility;Impaired vision History of fall(s)  Follow up  Falls evaluation completed Education provided;Falls evaluation completed;Falls prevention discussed Falls evaluation completed;Education provided;Falls prevention discussed Falls evaluation completed    MEDICARE RISK AT HOME: Medicare Risk at Home Any stairs in or around the home?: (Patient-Rptd) Yes If so, are there any without handrails?: (Patient-Rptd) No Home free of loose throw rugs in walkways, pet beds, electrical cords, etc?: (Patient-Rptd) Yes Adequate lighting in your home to reduce risk of falls?: (Patient-Rptd) Yes Life alert?: (Patient-Rptd) No Use of a cane, walker or w/c?: (Patient-Rptd) Yes Grab bars in the bathroom?: (Patient-Rptd) Yes Shower chair or bench in shower?: (Patient-Rptd) Yes Elevated toilet seat or a handicapped toilet?: (Patient-Rptd) Yes  TIMED UP AND GO:  Was the test performed?  Yes  Length of time to ambulate 10 feet: 20  sec Gait slow and steady with assistive device    Cognitive Function:        05/16/2022    3:14 PM 05/02/2021   12:26 PM 04/12/2020   10:33 AM 04/01/2019    2:33 PM  6CIT Screen  What Year? 0 points 0 points 0 points 0 points  What month? 0 points 0 points 0 points 0 points  What time? 0 points 0 points 3 points 3 points  Count back from 20 0 points 0 points 0 points 0 points  Months in reverse 0 points 0 points 0 points 0 points  Repeat phrase 0 points 2 points 2 points 2 points  Total Score 0 points 2 points 5 points 5 points    Immunizations Immunization History  Administered Date(s) Administered   Fluad Quad(high Dose 65+) 01/21/2020, 03/29/2021   Fluad Trivalent(High Dose 65+) 01/23/2023   INFLUENZA, HIGH DOSE SEASONAL PF 01/28/2017, 02/04/2019, 03/03/2024   Influenza-Unspecified 01/18/2013, 01/18/2018, 02/04/2019, 02/04/2022   PFIZER Comirnaty(Gray Top)Covid-19 Tri-Sucrose Vaccine 08/18/2020, 02/11/2022   PFIZER(Purple Top)SARS-COV-2 Vaccination 07/02/2019, 07/27/2019, 02/15/2020   PNEUMOCOCCAL CONJUGATE-20 12/13/2021   Pfizer Covid-19 Vaccine Bivalent Booster 67yrs & up 03/27/2021, 09/25/2021   Pneumococcal Polysaccharide-23 04/30/2010, 03/18/2016   RSV,unspecified 03/22/2022   Tdap 09/25/2021   Zoster Recombinant(Shingrix) 05/19/2017, 07/21/2017    TDAP status: Up to date  Flu Vaccine status: Up to date  Pneumococcal vaccine status: Up to date  Covid-19 vaccine status: Information provided on how to obtain vaccines.   Qualifies for Shingles Vaccine? Yes   Zostavax completed Yes   Shingrix Completed?: Yes  Screening Tests Health Maintenance  Topic Date Due   Medicare Annual Wellness (AWV)  05/17/2023   COVID-19 Vaccine (8 - 2025-26 season) 01/19/2024   DTaP/Tdap/Td (2 - Td or Tdap) 09/26/2031   Pneumococcal Vaccine: 50+ Years  Completed   Influenza Vaccine  Completed   Zoster Vaccines-  Shingrix  Completed   Meningococcal B Vaccine  Aged Out    Hepatitis C Screening  Discontinued    Health Maintenance  Health Maintenance Due  Topic Date Due   Medicare Annual Wellness (AWV)  05/17/2023   COVID-19 Vaccine (8 - 2025-26 season) 01/19/2024    Colorectal cancer screening: No longer required.   Lung Cancer Screening: (Low Dose CT Chest recommended if Age 16-80 years, 20 pack-year currently smoking OR have quit w/in 15years.) does not qualify.   Lung Cancer Screening Referral:   Additional Screening:  Hepatitis C Screening: does qualify; Completed 2021  Vision Screening: Recommended annual ophthalmology exams for early detection of glaucoma and other disorders of the eye. Is the patient up to date with their annual eye exam?  Yes  Who is the provider or what is the name of the office in which the patient attends annual eye exams? [facility] If pt is not established with a provider, would they like to be referred to a provider to establish care? No .   Dental Screening: Recommended annual dental exams for proper oral hygiene  Diabetic Foot Exam: NOT DM  Community Resource Referral / Chronic Care Management: CRR required this visit?  No   CCM required this visit?  No     Plan:     I have personally reviewed and noted the following in the patient's chart:   Medical and social history Use of alcohol, tobacco or illicit drugs  Current medications and supplements including opioid prescriptions. Patient is not currently taking opioid prescriptions. Functional ability and status Nutritional status Physical activity Advanced directives List of other physicians Hospitalizations, surgeries, and ER visits in previous 12 months Vitals Screenings to include cognitive, depression, and falls Referrals and appointments  In addition, I have reviewed and discussed with patient certain preventive protocols, quality metrics, and best practice recommendations. A written personalized care plan for preventive services as  well as general preventive health recommendations were provided to patient.     Kristeen JINNY Lunger, CMA   03/09/2024   After Visit Summary: (Declined) Due to this being a telephonic visit, with patients personalized plan was offered to patient but patient Declined AVS at this time   Nurse Notes:

## 2024-03-12 DIAGNOSIS — M48062 Spinal stenosis, lumbar region with neurogenic claudication: Secondary | ICD-10-CM | POA: Diagnosis not present

## 2024-03-12 DIAGNOSIS — N182 Chronic kidney disease, stage 2 (mild): Secondary | ICD-10-CM | POA: Diagnosis not present

## 2024-03-12 DIAGNOSIS — I1310 Hypertensive heart and chronic kidney disease without heart failure, with stage 1 through stage 4 chronic kidney disease, or unspecified chronic kidney disease: Secondary | ICD-10-CM | POA: Diagnosis not present

## 2024-03-12 DIAGNOSIS — M5416 Radiculopathy, lumbar region: Secondary | ICD-10-CM | POA: Diagnosis not present

## 2024-03-12 DIAGNOSIS — I739 Peripheral vascular disease, unspecified: Secondary | ICD-10-CM | POA: Diagnosis not present

## 2024-03-12 DIAGNOSIS — R262 Difficulty in walking, not elsewhere classified: Secondary | ICD-10-CM | POA: Diagnosis not present

## 2024-03-13 NOTE — Assessment & Plan Note (Signed)
 Chronic, controlled.  He will continue with nebivolol  and olmesartan  in the mornings. He will continue with amlodipine  in the evenings.  Encouraged to follow low sodium diet. He will f/u in 6 months for re-evaluation.

## 2024-03-13 NOTE — Assessment & Plan Note (Signed)
 Chronic, I will check renal function today. He is encouraged to keep BP controlled and stay well hydrated to prevent progression of CKD.

## 2024-03-13 NOTE — Assessment & Plan Note (Signed)
 Chronic, encouraged to increase daily activity. He is reminded chair exercises count as well.  He is on statin therapy. Cardiology input is appreciated.

## 2024-03-13 NOTE — Assessment & Plan Note (Signed)
 Unfortunately, he did not start mirtazapine  as discussed at his last visit.  Weight gain of two pounds, appetite good. Mirtazapine  not taken due to side effect concerns. Discussed mirtazapine  risks and benefits, serious side effects rare. - Encourage increased caloric intake with snacks post-activity. - Consider multivitamin for seniors, avoid Centrum and One A Day. - Recheck blood count for nutritional deficiencies.

## 2024-03-14 ENCOUNTER — Other Ambulatory Visit (INDEPENDENT_AMBULATORY_CARE_PROVIDER_SITE_OTHER): Payer: Self-pay | Admitting: Internal Medicine

## 2024-03-14 DIAGNOSIS — F419 Anxiety disorder, unspecified: Secondary | ICD-10-CM

## 2024-03-14 DIAGNOSIS — I1310 Hypertensive heart and chronic kidney disease without heart failure, with stage 1 through stage 4 chronic kidney disease, or unspecified chronic kidney disease: Secondary | ICD-10-CM | POA: Diagnosis not present

## 2024-03-14 DIAGNOSIS — N182 Chronic kidney disease, stage 2 (mild): Secondary | ICD-10-CM | POA: Diagnosis not present

## 2024-03-14 DIAGNOSIS — G4733 Obstructive sleep apnea (adult) (pediatric): Secondary | ICD-10-CM | POA: Diagnosis not present

## 2024-03-14 DIAGNOSIS — Z7409 Other reduced mobility: Secondary | ICD-10-CM

## 2024-03-14 DIAGNOSIS — M48062 Spinal stenosis, lumbar region with neurogenic claudication: Secondary | ICD-10-CM

## 2024-03-14 DIAGNOSIS — I739 Peripheral vascular disease, unspecified: Secondary | ICD-10-CM

## 2024-03-14 DIAGNOSIS — R269 Unspecified abnormalities of gait and mobility: Secondary | ICD-10-CM | POA: Diagnosis not present

## 2024-03-14 NOTE — Progress Notes (Signed)
 Cc: home health recertfication  Received home health orders orders from [facility] Start of care 12/26/23.   Certification and orders from 02/24/24 through 04/23/2024 are reviewed, signed and faxed back to home health company.  Need of intermittent skilled services at home: PT  The home health care plan has been established by me and will be reviewed and updated as needed to maximize patient recovery.  I certify that all home health services have been and will be furnished to the patient while under my care.  Face-to-face encounter in which the need for home health services was established: 12/22/23  Patient is receiving home health services for the following diagnoses: Problem List Items Addressed This Visit       Cardiovascular and Mediastinum   Hypertensive heart and renal disease   Peripheral artery disease     Respiratory   OSA (obstructive sleep apnea)     Genitourinary   Chronic renal disease, stage II     Other   Gait disturbance   Spinal stenosis of lumbar region   Anxiety - Primary   Impaired mobility     Catheryn LOISE Slocumb, MD

## 2024-03-15 DIAGNOSIS — M5416 Radiculopathy, lumbar region: Secondary | ICD-10-CM | POA: Diagnosis not present

## 2024-03-15 DIAGNOSIS — I739 Peripheral vascular disease, unspecified: Secondary | ICD-10-CM | POA: Diagnosis not present

## 2024-03-15 DIAGNOSIS — N182 Chronic kidney disease, stage 2 (mild): Secondary | ICD-10-CM | POA: Diagnosis not present

## 2024-03-15 DIAGNOSIS — I1310 Hypertensive heart and chronic kidney disease without heart failure, with stage 1 through stage 4 chronic kidney disease, or unspecified chronic kidney disease: Secondary | ICD-10-CM | POA: Diagnosis not present

## 2024-03-15 DIAGNOSIS — M48062 Spinal stenosis, lumbar region with neurogenic claudication: Secondary | ICD-10-CM | POA: Diagnosis not present

## 2024-03-15 DIAGNOSIS — R262 Difficulty in walking, not elsewhere classified: Secondary | ICD-10-CM | POA: Diagnosis not present

## 2024-03-17 ENCOUNTER — Telehealth: Payer: Self-pay

## 2024-03-22 ENCOUNTER — Encounter: Payer: Self-pay | Admitting: Radiology

## 2024-03-24 DIAGNOSIS — R262 Difficulty in walking, not elsewhere classified: Secondary | ICD-10-CM | POA: Diagnosis not present

## 2024-03-24 DIAGNOSIS — M48062 Spinal stenosis, lumbar region with neurogenic claudication: Secondary | ICD-10-CM | POA: Diagnosis not present

## 2024-03-24 DIAGNOSIS — N182 Chronic kidney disease, stage 2 (mild): Secondary | ICD-10-CM | POA: Diagnosis not present

## 2024-03-24 DIAGNOSIS — I1310 Hypertensive heart and chronic kidney disease without heart failure, with stage 1 through stage 4 chronic kidney disease, or unspecified chronic kidney disease: Secondary | ICD-10-CM | POA: Diagnosis not present

## 2024-03-24 DIAGNOSIS — M5416 Radiculopathy, lumbar region: Secondary | ICD-10-CM | POA: Diagnosis not present

## 2024-03-24 DIAGNOSIS — I739 Peripheral vascular disease, unspecified: Secondary | ICD-10-CM | POA: Diagnosis not present

## 2024-03-25 DIAGNOSIS — E7800 Pure hypercholesterolemia, unspecified: Secondary | ICD-10-CM | POA: Diagnosis not present

## 2024-03-25 DIAGNOSIS — N182 Chronic kidney disease, stage 2 (mild): Secondary | ICD-10-CM | POA: Diagnosis not present

## 2024-03-25 DIAGNOSIS — I739 Peripheral vascular disease, unspecified: Secondary | ICD-10-CM | POA: Diagnosis not present

## 2024-03-25 DIAGNOSIS — F419 Anxiety disorder, unspecified: Secondary | ICD-10-CM | POA: Diagnosis not present

## 2024-03-25 DIAGNOSIS — R634 Abnormal weight loss: Secondary | ICD-10-CM | POA: Diagnosis not present

## 2024-03-25 DIAGNOSIS — K5909 Other constipation: Secondary | ICD-10-CM | POA: Diagnosis not present

## 2024-03-25 DIAGNOSIS — I1310 Hypertensive heart and chronic kidney disease without heart failure, with stage 1 through stage 4 chronic kidney disease, or unspecified chronic kidney disease: Secondary | ICD-10-CM | POA: Diagnosis not present

## 2024-03-25 DIAGNOSIS — Z91148 Patient's other noncompliance with medication regimen for other reason: Secondary | ICD-10-CM | POA: Diagnosis not present

## 2024-03-25 DIAGNOSIS — Z6820 Body mass index (BMI) 20.0-20.9, adult: Secondary | ICD-10-CM | POA: Diagnosis not present

## 2024-03-25 DIAGNOSIS — M5416 Radiculopathy, lumbar region: Secondary | ICD-10-CM | POA: Diagnosis not present

## 2024-03-25 DIAGNOSIS — M48062 Spinal stenosis, lumbar region with neurogenic claudication: Secondary | ICD-10-CM | POA: Diagnosis not present

## 2024-03-25 DIAGNOSIS — Z9181 History of falling: Secondary | ICD-10-CM | POA: Diagnosis not present

## 2024-03-25 DIAGNOSIS — R262 Difficulty in walking, not elsewhere classified: Secondary | ICD-10-CM | POA: Diagnosis not present

## 2024-03-31 DIAGNOSIS — M48062 Spinal stenosis, lumbar region with neurogenic claudication: Secondary | ICD-10-CM | POA: Diagnosis not present

## 2024-03-31 DIAGNOSIS — N182 Chronic kidney disease, stage 2 (mild): Secondary | ICD-10-CM | POA: Diagnosis not present

## 2024-03-31 DIAGNOSIS — R262 Difficulty in walking, not elsewhere classified: Secondary | ICD-10-CM | POA: Diagnosis not present

## 2024-03-31 DIAGNOSIS — I739 Peripheral vascular disease, unspecified: Secondary | ICD-10-CM | POA: Diagnosis not present

## 2024-03-31 DIAGNOSIS — I1310 Hypertensive heart and chronic kidney disease without heart failure, with stage 1 through stage 4 chronic kidney disease, or unspecified chronic kidney disease: Secondary | ICD-10-CM | POA: Diagnosis not present

## 2024-03-31 DIAGNOSIS — M5416 Radiculopathy, lumbar region: Secondary | ICD-10-CM | POA: Diagnosis not present

## 2024-04-02 DIAGNOSIS — H2512 Age-related nuclear cataract, left eye: Secondary | ICD-10-CM | POA: Diagnosis not present

## 2024-04-02 DIAGNOSIS — H5202 Hypermetropia, left eye: Secondary | ICD-10-CM | POA: Diagnosis not present

## 2024-04-02 DIAGNOSIS — H401121 Primary open-angle glaucoma, left eye, mild stage: Secondary | ICD-10-CM | POA: Diagnosis not present

## 2024-04-06 ENCOUNTER — Telehealth: Payer: Self-pay

## 2024-04-06 NOTE — Patient Outreach (Signed)
 Complex Care Management Care Guide Note  04/06/2024 Name: Chad Miller MRN: [id number] DOB: 1941-03-21  [name] is a 83 y.o. year old male who is a primary care patient of Jarold Medici, MD and is actively engaged with the care management team. Yoni Lobos wife called to re-scheduling  with the RN Case Manager.  Follow up plan: Telephone appointment with complex care management team member scheduled for:  04/30/24  Shereen Gin [facility]  Population Health VBCI Assistant Direct Dial: [phone]  Fax: [phone] Website: [email or web address]

## 2024-04-06 NOTE — Patient Outreach (Signed)
 Spoke with wife Elijah who confirmed patient's next scheduled telephone call with this RN was rescheduled for 04/30/24 at 1:30 PM. She voices having no further concerns at this time.   Clayborne Ly RN BSN CCM [place]  [facility], [facility] Nurse Care Coordinator  Direct Dial: [phone] Website: [email or web address]

## 2024-04-07 ENCOUNTER — Telehealth: Payer: Self-pay

## 2024-04-09 DIAGNOSIS — I1310 Hypertensive heart and chronic kidney disease without heart failure, with stage 1 through stage 4 chronic kidney disease, or unspecified chronic kidney disease: Secondary | ICD-10-CM | POA: Diagnosis not present

## 2024-04-09 DIAGNOSIS — I739 Peripheral vascular disease, unspecified: Secondary | ICD-10-CM | POA: Diagnosis not present

## 2024-04-09 DIAGNOSIS — R262 Difficulty in walking, not elsewhere classified: Secondary | ICD-10-CM | POA: Diagnosis not present

## 2024-04-09 DIAGNOSIS — M48062 Spinal stenosis, lumbar region with neurogenic claudication: Secondary | ICD-10-CM | POA: Diagnosis not present

## 2024-04-09 DIAGNOSIS — N182 Chronic kidney disease, stage 2 (mild): Secondary | ICD-10-CM | POA: Diagnosis not present

## 2024-04-09 DIAGNOSIS — M5416 Radiculopathy, lumbar region: Secondary | ICD-10-CM | POA: Diagnosis not present

## 2024-04-20 DIAGNOSIS — M48062 Spinal stenosis, lumbar region with neurogenic claudication: Secondary | ICD-10-CM | POA: Diagnosis not present

## 2024-04-20 DIAGNOSIS — R262 Difficulty in walking, not elsewhere classified: Secondary | ICD-10-CM | POA: Diagnosis not present

## 2024-04-20 DIAGNOSIS — I1310 Hypertensive heart and chronic kidney disease without heart failure, with stage 1 through stage 4 chronic kidney disease, or unspecified chronic kidney disease: Secondary | ICD-10-CM | POA: Diagnosis not present

## 2024-04-20 DIAGNOSIS — M5416 Radiculopathy, lumbar region: Secondary | ICD-10-CM | POA: Diagnosis not present

## 2024-04-20 DIAGNOSIS — N182 Chronic kidney disease, stage 2 (mild): Secondary | ICD-10-CM | POA: Diagnosis not present

## 2024-04-20 DIAGNOSIS — I739 Peripheral vascular disease, unspecified: Secondary | ICD-10-CM | POA: Diagnosis not present

## 2024-04-30 ENCOUNTER — Other Ambulatory Visit: Payer: Self-pay

## 2024-04-30 NOTE — Patient Outreach (Signed)
 Complex Care Management   Visit Note  04/30/2024  Name:  Chad Miller MRN: [id number] DOB: 1941-04-12  Situation: Referral received for Complex Care Management related to Primary Hypertension, Chronic Constipation, Recurrent Falls, Hearing loss, Vertigo, Blindness in right eye, Chronic low back pain, Chronic left knee pain. I obtained verbal consent from Patient.  Visit completed with Patient  on the phone.  Background:   Past Medical History:  Diagnosis Date   Arthritis    Cataract    Glaucoma    High cholesterol    Hypertension     Assessment: Patient Reported Symptoms:  Cognitive Cognitive Status: Alert and oriented to person, place, and time, Normal speech and language skills Cognitive/Intellectual Conditions Management [RPT]: None reported or documented in medical history or problem list   Health Maintenance Behaviors: Annual physical exam Health Facilitated by: Rest  Neurological Neurological Review of Symptoms: No symptoms reported    HEENT HEENT Symptoms Reported: No symptoms reported      Cardiovascular Cardiovascular Symptoms Reported: No symptoms reported    Respiratory Respiratory Symptoms Reported: No symptoms reported    Endocrine Endocrine Symptoms Reported: No symptoms reported Is patient diabetic?: No    Gastrointestinal Gastrointestinal Symptoms Reported: Change in appetite Gastrointestinal Management Strategies: Nutrition support, Diet modification Enteral Nutrition Schedule: oral protein shakes Gastrointestinal Self-Management Outcome: 3 (uncertain)    Genitourinary Genitourinary Symptoms Reported: No symptoms reported    Integumentary Integumentary Symptoms Reported: No symptoms reported    Musculoskeletal Musculoskelatal Symptoms Reviewed: Unsteady gait, Difficulty walking, Limited mobility, Joint pain Musculoskeletal Management Strategies: Routine screening, Medical device Musculoskeletal Self-Management Outcome: 4 (good) Falls in the past  year?: Yes Number of falls in past year: 2 or more Was there an injury with Fall?: No Fall Risk Category Calculator: 2 Patient Fall Risk Level: Moderate Fall Risk Patient at Risk for Falls Due to: Impaired mobility, Impaired balance/gait, History of fall(s) Fall risk Follow up: Education provided, Falls evaluation completed  Psychosocial Psychosocial Symptoms Reported: No symptoms reported   Major Change/Loss/Stressor/Fears (CP): Medical condition, self Techniques to Cope with Loss/Stress/Change: Diversional activities Quality of Family Relationships: helpful, involved, supportive Do you feel physically threatened by others?: No    04/30/2024    PHQ2-9 Depression Screening   Leanthony Rhett interest or pleasure in doing things    Feeling down, depressed, or hopeless    PHQ-2 - Total Score    Trouble falling or staying asleep, or sleeping too much    Feeling tired or having Liley Rake energy    Poor appetite or overeating     Feeling bad about yourself - or that you are a failure or have let yourself or your family down    Trouble concentrating on things, such as reading the newspaper or watching television    Moving or speaking so slowly that other people could have noticed.  Or the opposite - being so fidgety or restless that you have been moving around a lot more than usual    Thoughts that you would be better off dead, or hurting yourself in some way    PHQ2-9 Total Score    If you checked off any problems, how difficult have these problems made it for you to do your work, take care of things at home, or get along with other people    Depression Interventions/Treatment      There were no vitals filed for this visit. Pain Scale: Not given for pain  Medications Reviewed Today     Reviewed by Morgan Clayborne CROME,  RN (Registered Nurse) on 04/30/24 at 1400  Med List Status: <None>   Medication Order Taking? Sig Documenting Provider Last Dose Status Informant  acetaminophen (TYLENOL) 325 MG  tablet [id number]  Take 650 mg by mouth every 6 (six) hours as needed. [provider]  Active   amLODipine  (NORVASC ) 5 MG tablet [id number]  TAKE 1 TABLET EVERY KARNA Jarold Medici, MD  Active   atorvastatin  (LIPITOR) 10 MG tablet [id number]  TAKE 1 TABLET DAILY Jarold Medici, MD  Active   mirtazapine  (REMERON ) 7.5 MG tablet [id number]  Take 1 tablet (7.5 mg total) by mouth at bedtime. Jarold Medici, MD  Active   mupirocin  ointment (BACTROBAN ) 2 % [id number]  Apply 1 Application topically 2 (two) times daily.  Patient not taking: Reported on 03/03/2024   Jarold Medici, MD  Active   nebivolol  (BYSTOLIC ) 10 MG tablet [id number]  TAKE 1 TABLET EVERY        MORNING. DISCONTINUE       METOPROLOL  Jarold Medici, MD  Active   Nutritional Supplements (EQUATE) LIQD [id number]  Take by mouth daily. [provider]  Active   olmesartan  (BENICAR ) 20 MG tablet [id number]  TAKE 1 TABLET EVERY MORNING Jarold Medici, MD  Active             Recommendation:   PCP Follow-up Continue Current Plan of Care  Follow Up Plan:   Closing From:  Complex Care Management  Clayborne Ly RN BSN CCM [facility]  [employer], [facility] Nurse Care Coordinator  Direct Dial: [phone] Website: [email or web address]

## 2024-04-30 NOTE — Patient Instructions (Signed)
 Visit Information  Thank you for taking time to visit with me today.    Please call 1-800-273-TALK (toll free, 24 hour hotline) if you are experiencing a Mental Health or Behavioral Health Crisis or need someone to talk to.  Clayborne Ly RN BSN CCM [place]  [facility], [facility] Nurse Care Coordinator  Direct Dial: [phone] Website: [email or web address]

## 2024-05-03 DIAGNOSIS — H401121 Primary open-angle glaucoma, left eye, mild stage: Secondary | ICD-10-CM | POA: Diagnosis not present

## 2024-05-12 ENCOUNTER — Other Ambulatory Visit: Payer: Self-pay | Admitting: Internal Medicine

## 2024-05-12 DIAGNOSIS — R269 Unspecified abnormalities of gait and mobility: Secondary | ICD-10-CM | POA: Diagnosis not present

## 2024-05-12 DIAGNOSIS — M25562 Pain in left knee: Secondary | ICD-10-CM

## 2024-05-12 DIAGNOSIS — Z96652 Presence of left artificial knee joint: Secondary | ICD-10-CM

## 2024-05-12 DIAGNOSIS — M48062 Spinal stenosis, lumbar region with neurogenic claudication: Secondary | ICD-10-CM

## 2024-05-12 DIAGNOSIS — I1310 Hypertensive heart and chronic kidney disease without heart failure, with stage 1 through stage 4 chronic kidney disease, or unspecified chronic kidney disease: Secondary | ICD-10-CM | POA: Diagnosis not present

## 2024-05-12 DIAGNOSIS — N182 Chronic kidney disease, stage 2 (mild): Secondary | ICD-10-CM

## 2024-05-12 DIAGNOSIS — G8929 Other chronic pain: Secondary | ICD-10-CM

## 2024-05-12 DIAGNOSIS — G4733 Obstructive sleep apnea (adult) (pediatric): Secondary | ICD-10-CM | POA: Diagnosis not present

## 2024-05-12 DIAGNOSIS — I739 Peripheral vascular disease, unspecified: Secondary | ICD-10-CM | POA: Diagnosis not present

## 2024-05-12 NOTE — Progress Notes (Signed)
 CC: Home health re-certification  Received home health orders orders from [facility]. Start of care 12/26/2023.   Certification and orders from 04/24/24 through 06/22/2024 are reviewed, signed and faxed back to home health company.  Need of intermittent skilled services at home: PT  The home health care plan has been established by me and will be reviewed and updated as needed to maximize patient recovery.  I certify that all home health services have been and will be furnished to the patient while under my care.  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

## 2024-06-25 ENCOUNTER — Ambulatory Visit: Payer: Self-pay

## 2024-06-25 NOTE — Telephone Encounter (Signed)
 FYI Only or Action Required?: FYI only for provider: appointment scheduled on 2/9.  Patient was last seen in primary care on 03/03/2024 by Jarold Medici, MD.  [facility] Nurse Triage reporting Fall.  Symptoms began yesterday.  Interventions attempted: Nothing.  Symptoms are: stable.  Triage Disposition: See PCP Within 2 Weeks  Patient/caregiver understands and will follow disposition?: Yes   Pt fell yesterday after losing footing. No head injury or other bodily injury. Pt was able to get up and keep walking. No dizziness or weakness. Has fallen at least twice in the past several months. Advised to f/u with PCP in 2 weeks. Already has OV scheduled with PCP on Monday for BP check, updated appt notes to induce pts frequent falling. Advised to call back for worsening symptoms.    Reason for Triage: Leita from [facility] called in with an update about pt for him to be discharged but she stated that patient has had multiple falls and he fell over the weekend. They recommended pt be in wheelchair unless he has someone else with him.  Leita was no longer with patient. She saw him earlier.  [phone] (M) Patients home #   Reason for Disposition  [1] Falling (two or more unexpected falls) AND [2] in past year  Answer Assessment - Initial Assessment Questions 1. MECHANISM: How did the fall happen?     Lost footing 2. DOMESTIC VIOLENCE AND ELDER ABUSE SCREENING: Did you fall because someone pushed you or tried to hurt you? If Yes, ask: Are you safe now?     No 3. ONSET: When did the fall happen? (e.g., minutes, hours, or days ago)     Yesterday 4. LOCATION: What part of the body hit the ground? (e.g., back, buttocks, head, hips, knees, hands, head, stomach)     Onto his side 5. INJURY: Did you hurt (injure) yourself when you fell? If Yes, ask: What did you injure? Tell me more about this? (e.g., body area; type of injury; pain severity)     No 6. PAIN: Is there any pain?  If Yes, ask: How bad is the pain? (e.g., Scale 0-10; or none, mild,      None 7. SIZE: For cuts, bruises, or swelling, ask: How large is it? (e.g., inches or centimeters)      No 9. OTHER SYMPTOMS: Do you have any other symptoms? (e.g., dizziness, fever, weakness; new-onset or worsening).      No 10. CAUSE: What do you think caused the fall (or falling)? (e.g., dizzy spell, tripped)       Lost footing  Protocols used: Falls and [facility]

## 2024-06-28 ENCOUNTER — Ambulatory Visit: Payer: Self-pay | Admitting: Internal Medicine

## 2025-04-06 ENCOUNTER — Ambulatory Visit: Payer: Self-pay
